# Patient Record
Sex: Female | Born: 1935
Health system: Southern US, Community
[De-identification: ages and names within clinical notes are randomized; demographics above are authoritative.]

## PROBLEM LIST (undated history)

## (undated) DIAGNOSIS — G459 Transient cerebral ischemic attack, unspecified: Secondary | ICD-10-CM

## (undated) DIAGNOSIS — C801 Malignant (primary) neoplasm, unspecified: Secondary | ICD-10-CM

## (undated) DIAGNOSIS — I10 Essential (primary) hypertension: Secondary | ICD-10-CM

## (undated) DIAGNOSIS — N8110 Cystocele, unspecified: Secondary | ICD-10-CM

## (undated) DIAGNOSIS — F0390 Unspecified dementia without behavioral disturbance: Secondary | ICD-10-CM

## (undated) HISTORY — PX: OTHER SURGICAL HISTORY: SHX169

## (undated) HISTORY — PX: ABDOMINAL HYSTERECTOMY: SHX81

---
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
## 2001-08-23 ENCOUNTER — Encounter: Payer: Self-pay | Admitting: Internal Medicine

## 2001-08-23 ENCOUNTER — Encounter: Admission: RE | Admit: 2001-08-23 | Discharge: 2001-08-23 | Payer: Self-pay | Admitting: Internal Medicine

## 2001-09-06 ENCOUNTER — Ambulatory Visit: Admission: RE | Admit: 2001-09-06 | Discharge: 2001-12-05 | Payer: Self-pay | Admitting: Radiation Oncology

## 2012-01-10 DIAGNOSIS — J343 Hypertrophy of nasal turbinates: Secondary | ICD-10-CM | POA: Diagnosis not present

## 2012-01-10 DIAGNOSIS — J309 Allergic rhinitis, unspecified: Secondary | ICD-10-CM | POA: Diagnosis not present

## 2012-01-10 DIAGNOSIS — H60509 Unspecified acute noninfective otitis externa, unspecified ear: Secondary | ICD-10-CM | POA: Diagnosis not present

## 2012-04-10 DIAGNOSIS — I10 Essential (primary) hypertension: Secondary | ICD-10-CM | POA: Diagnosis not present

## 2012-04-10 DIAGNOSIS — Z853 Personal history of malignant neoplasm of breast: Secondary | ICD-10-CM | POA: Diagnosis not present

## 2012-04-10 DIAGNOSIS — E559 Vitamin D deficiency, unspecified: Secondary | ICD-10-CM | POA: Diagnosis not present

## 2012-04-10 DIAGNOSIS — E785 Hyperlipidemia, unspecified: Secondary | ICD-10-CM | POA: Diagnosis not present

## 2012-04-10 DIAGNOSIS — Z79899 Other long term (current) drug therapy: Secondary | ICD-10-CM | POA: Diagnosis not present

## 2012-05-01 DIAGNOSIS — H9209 Otalgia, unspecified ear: Secondary | ICD-10-CM | POA: Diagnosis not present

## 2012-05-01 DIAGNOSIS — S199XXA Unspecified injury of neck, initial encounter: Secondary | ICD-10-CM | POA: Diagnosis not present

## 2012-05-01 DIAGNOSIS — H60509 Unspecified acute noninfective otitis externa, unspecified ear: Secondary | ICD-10-CM | POA: Diagnosis not present

## 2012-05-01 DIAGNOSIS — J309 Allergic rhinitis, unspecified: Secondary | ICD-10-CM | POA: Diagnosis not present

## 2012-05-01 DIAGNOSIS — J343 Hypertrophy of nasal turbinates: Secondary | ICD-10-CM | POA: Diagnosis not present

## 2012-05-01 DIAGNOSIS — S0993XA Unspecified injury of face, initial encounter: Secondary | ICD-10-CM | POA: Diagnosis not present

## 2012-05-28 DIAGNOSIS — H25099 Other age-related incipient cataract, unspecified eye: Secondary | ICD-10-CM | POA: Diagnosis not present

## 2012-07-19 DIAGNOSIS — C50919 Malignant neoplasm of unspecified site of unspecified female breast: Secondary | ICD-10-CM | POA: Diagnosis not present

## 2012-07-31 DIAGNOSIS — D485 Neoplasm of uncertain behavior of skin: Secondary | ICD-10-CM | POA: Diagnosis not present

## 2012-07-31 DIAGNOSIS — D235 Other benign neoplasm of skin of trunk: Secondary | ICD-10-CM | POA: Diagnosis not present

## 2012-07-31 DIAGNOSIS — D2330 Other benign neoplasm of skin of unspecified part of face: Secondary | ICD-10-CM | POA: Diagnosis not present

## 2012-07-31 DIAGNOSIS — L820 Inflamed seborrheic keratosis: Secondary | ICD-10-CM | POA: Diagnosis not present

## 2012-08-10 DIAGNOSIS — L820 Inflamed seborrheic keratosis: Secondary | ICD-10-CM | POA: Diagnosis not present

## 2012-09-04 DIAGNOSIS — D235 Other benign neoplasm of skin of trunk: Secondary | ICD-10-CM | POA: Diagnosis not present

## 2012-09-04 DIAGNOSIS — D2330 Other benign neoplasm of skin of unspecified part of face: Secondary | ICD-10-CM | POA: Diagnosis not present

## 2012-09-04 DIAGNOSIS — L820 Inflamed seborrheic keratosis: Secondary | ICD-10-CM | POA: Diagnosis not present

## 2012-09-04 DIAGNOSIS — D485 Neoplasm of uncertain behavior of skin: Secondary | ICD-10-CM | POA: Diagnosis not present

## 2012-09-18 DIAGNOSIS — D2330 Other benign neoplasm of skin of unspecified part of face: Secondary | ICD-10-CM | POA: Diagnosis not present

## 2012-09-18 DIAGNOSIS — D235 Other benign neoplasm of skin of trunk: Secondary | ICD-10-CM | POA: Diagnosis not present

## 2012-12-05 DIAGNOSIS — G459 Transient cerebral ischemic attack, unspecified: Secondary | ICD-10-CM

## 2012-12-05 HISTORY — DX: Transient cerebral ischemic attack, unspecified: G45.9

## 2013-02-18 DIAGNOSIS — I10 Essential (primary) hypertension: Secondary | ICD-10-CM | POA: Diagnosis not present

## 2013-02-18 DIAGNOSIS — F17200 Nicotine dependence, unspecified, uncomplicated: Secondary | ICD-10-CM | POA: Diagnosis not present

## 2013-02-18 DIAGNOSIS — N8111 Cystocele, midline: Secondary | ICD-10-CM | POA: Diagnosis not present

## 2013-05-02 DIAGNOSIS — J019 Acute sinusitis, unspecified: Secondary | ICD-10-CM | POA: Diagnosis not present

## 2013-06-08 ENCOUNTER — Emergency Department (HOSPITAL_COMMUNITY): Payer: Medicare Other

## 2013-06-08 ENCOUNTER — Encounter (HOSPITAL_COMMUNITY): Payer: Self-pay

## 2013-06-08 ENCOUNTER — Observation Stay (HOSPITAL_COMMUNITY)
Admission: EM | Admit: 2013-06-08 | Discharge: 2013-06-10 | Disposition: A | Discharge status: Home / Self Care | Payer: Medicare Other | Attending: Internal Medicine | Admitting: Internal Medicine

## 2013-06-08 DIAGNOSIS — Z7982 Long term (current) use of aspirin: Secondary | ICD-10-CM | POA: Diagnosis not present

## 2013-06-08 DIAGNOSIS — I10 Essential (primary) hypertension: Secondary | ICD-10-CM | POA: Insufficient documentation

## 2013-06-08 DIAGNOSIS — R270 Ataxia, unspecified: Secondary | ICD-10-CM | POA: Diagnosis present

## 2013-06-08 DIAGNOSIS — Z79899 Other long term (current) drug therapy: Secondary | ICD-10-CM | POA: Insufficient documentation

## 2013-06-08 DIAGNOSIS — H02409 Unspecified ptosis of unspecified eyelid: Secondary | ICD-10-CM | POA: Insufficient documentation

## 2013-06-08 DIAGNOSIS — J329 Chronic sinusitis, unspecified: Secondary | ICD-10-CM | POA: Diagnosis not present

## 2013-06-08 DIAGNOSIS — R279 Unspecified lack of coordination: Secondary | ICD-10-CM

## 2013-06-08 DIAGNOSIS — H532 Diplopia: Secondary | ICD-10-CM | POA: Diagnosis not present

## 2013-06-08 DIAGNOSIS — R42 Dizziness and giddiness: Secondary | ICD-10-CM | POA: Insufficient documentation

## 2013-06-08 DIAGNOSIS — Z720 Tobacco use: Secondary | ICD-10-CM | POA: Diagnosis present

## 2013-06-08 DIAGNOSIS — Z853 Personal history of malignant neoplasm of breast: Secondary | ICD-10-CM | POA: Insufficient documentation

## 2013-06-08 DIAGNOSIS — G459 Transient cerebral ischemic attack, unspecified: Secondary | ICD-10-CM | POA: Insufficient documentation

## 2013-06-08 DIAGNOSIS — F17200 Nicotine dependence, unspecified, uncomplicated: Secondary | ICD-10-CM | POA: Diagnosis not present

## 2013-06-08 DIAGNOSIS — H538 Other visual disturbances: Secondary | ICD-10-CM | POA: Diagnosis not present

## 2013-06-08 DIAGNOSIS — J323 Chronic sphenoidal sinusitis: Secondary | ICD-10-CM | POA: Diagnosis not present

## 2013-06-08 HISTORY — DX: Cystocele, unspecified: N81.10

## 2013-06-08 HISTORY — DX: Essential (primary) hypertension: I10

## 2013-06-08 HISTORY — DX: Malignant (primary) neoplasm, unspecified: C80.1

## 2013-06-08 LAB — RAPID URINE DRUG SCREEN, HOSP PERFORMED
Barbiturates: NOT DETECTED
Benzodiazepines: NOT DETECTED
Cocaine: NOT DETECTED
Opiates: NOT DETECTED
Tetrahydrocannabinol: NOT DETECTED

## 2013-06-08 LAB — GLUCOSE, CAPILLARY: Glucose-Capillary: 97 mg/dL (ref 70–99)

## 2013-06-08 LAB — COMPREHENSIVE METABOLIC PANEL
ALT: 12 U/L (ref 0–35)
AST: 14 U/L (ref 0–37)
Calcium: 8.8 mg/dL (ref 8.4–10.5)
Creatinine, Ser: 0.54 mg/dL (ref 0.50–1.10)
GFR calc Af Amer: >90 mL/min (ref >90)
Sodium: 136 mEq/L (ref 135–145)
Total Protein: 6.5 g/dL (ref 6.0–8.3)

## 2013-06-08 LAB — URINALYSIS, ROUTINE W REFLEX MICROSCOPIC
Bilirubin Urine: NEGATIVE
Ketones, ur: NEGATIVE mg/dL
Nitrite: NEGATIVE
pH: 7 (ref 5.0–8.0)

## 2013-06-08 LAB — CBC
HCT: 37 % (ref 36.0–46.0)
MCH: 31.9 pg (ref 26.0–34.0)
MCH: 31.4 pg (ref 26.0–34.0)
MCHC: 34.8 g/dL (ref 30.0–36.0)
MCV: 91.9 fL (ref 78.0–100.0)
MCV: 91.4 fL (ref 78.0–100.0)
Platelets: 352 10*3/uL (ref 150–400)
RBC: 4.05 MIL/uL (ref 3.87–5.11)
RDW: 14.4 % (ref 11.5–15.5)
WBC: 9.3 10*3/uL (ref 4.0–10.5)

## 2013-06-08 LAB — POCT I-STAT, CHEM 8
BUN: 13 mg/dL (ref 6–23)
Calcium, Ion: 1.1 mmol/L — ABNORMAL LOW (ref 1.13–1.30)
Creatinine, Ser: 0.6 mg/dL (ref 0.50–1.10)
Hemoglobin: 12.6 g/dL (ref 12.0–15.0)
TCO2: 23 mmol/L (ref 0–100)

## 2013-06-08 LAB — ETHANOL: Alcohol, Ethyl (B): <11 mg/dL (ref 0–11)

## 2013-06-08 LAB — DIFFERENTIAL
Basophils Absolute: 0 10*3/uL (ref 0.0–0.1)
Eosinophils Absolute: 0.2 10*3/uL (ref 0.0–0.7)
Eosinophils Relative: 2 % (ref 0–5)

## 2013-06-08 LAB — CREATININE, SERUM: GFR calc Af Amer: >90 mL/min (ref >90)

## 2013-06-08 LAB — POCT I-STAT TROPONIN I: Troponin i, poc: 0 ng/mL (ref 0.00–0.08)

## 2013-06-08 LAB — URINE MICROSCOPIC-ADD ON

## 2013-06-08 LAB — PROTIME-INR: Prothrombin Time: 11.9 seconds (ref 11.6–15.2)

## 2013-06-08 MED ORDER — ACETAMINOPHEN 325 MG PO TABS: 650.0000 mg | ORAL_TABLET | Freq: Four times a day (QID) | ORAL | Status: DC | PRN

## 2013-06-08 MED ORDER — AMOXICILLIN 500 MG PO CAPS
500.0000 mg | ORAL_CAPSULE | Freq: Three times a day (TID) | ORAL | Status: DC
  Filled 2013-06-08 (×2): qty 1

## 2013-06-08 MED ORDER — ASPIRIN 325 MG PO TABS: 325.0000 mg | ORAL_TABLET | Freq: Every day | ORAL | Status: DC

## 2013-06-08 MED ORDER — OLMESARTAN MEDOXOMIL 40 MG PO TABS
40.0000 mg | ORAL_TABLET | Freq: Every day | ORAL | Status: DC
  Administered 2013-06-08 – 2013-06-10 (×3): 40 mg via ORAL
  Filled 2013-06-08 (×3): qty 1

## 2013-06-08 MED ORDER — IRBESARTAN 300 MG PO TABS
300.0000 mg | ORAL_TABLET | Freq: Every day | ORAL | Status: DC
  Filled 2013-06-08: qty 1

## 2013-06-08 MED ORDER — HEPARIN SODIUM (PORCINE) 5000 UNIT/ML IJ SOLN
5000.0000 [IU] | Freq: Three times a day (TID) | INTRAMUSCULAR | Status: DC
  Filled 2013-06-08 (×9): qty 1

## 2013-06-08 MED ORDER — ASPIRIN 325 MG PO TABS
325.0000 mg | ORAL_TABLET | Freq: Every day | ORAL | Status: DC
  Administered 2013-06-08 – 2013-06-10 (×3): 325 mg via ORAL
  Filled 2013-06-08 (×3): qty 1

## 2013-06-08 MED ORDER — SODIUM CHLORIDE 0.9 % IV SOLN
INTRAVENOUS | Status: DC
  Administered 2013-06-08: 18:00:00 via INTRAVENOUS

## 2013-06-08 MED ORDER — AMOXICILLIN 500 MG PO CAPS
500.0000 mg | ORAL_CAPSULE | Freq: Three times a day (TID) | ORAL | Status: DC
  Administered 2013-06-08 – 2013-06-10 (×6): 500 mg via ORAL
  Filled 2013-06-08 (×10): qty 1

## 2013-06-08 MED ORDER — METOCLOPRAMIDE HCL 5 MG/ML IJ SOLN
10.0000 mg | Freq: Once | INTRAMUSCULAR | Status: AC
  Administered 2013-06-08: 10 mg via INTRAVENOUS
  Filled 2013-06-08: qty 2

## 2013-06-08 MED ORDER — FLUTICASONE PROPIONATE 50 MCG/ACT NA SUSP
1.0000 | Freq: Every day | NASAL | Status: DC
  Filled 2013-06-08: qty 16

## 2013-06-08 NOTE — H&P (Signed)
PCP:   Pearla Dubonnet, MD   Chief Complaint:  Dizziness, visual obscuration, unsteadiness.   HPI: This is an 79 year old female, with known history of tobacco abuse, right breast cancer 2002, s/p Lumpectomy/XRT, HTN, bladder prolapse, s/p TAH 2007, According to history obtained from patient's son and daughter who were at the bedside, and supplemented by patient herself, patient went to a cook-out at her daughter' house on 06/07/13, and spent all day there. She was in her usual state of health, until she returned home at about 8:45 PM. On getting out of the car, she complained of dizziness, but was able to walk into the house, and sit on a chair. Later went to bed at about 9:00 PM, and had to get up about 3-4 times during the night, to use the bathroom. Each time, she experienced unsteadiness, but no vertigo or tinnitus. No slurring of speech, difficulty in swallowing, or limb weakness. Otherwise, she slept well. On waking in the morning at about 8:00 AM, she noted double vision on right lateral gaze, without blurring. Son spent the night in patient's home, she awoke him about 10:00AM, and he brought her to the ED, were Head CT scan and brain MRI were negative for acute findings, although mild chronic right sphenoid sinusitis was noted on CT. Patient has had chronic sinus disease and nasal congestion and post-nasal drip for several months. About 3 weeks ago, daughter took her to see ENT, she was prescribed Augumentin 875 mg BID, but refused to take it, because the pills were too big. She has remained symptomatic.     Allergies:  No Known Allergies    Past Medical History  Diagnosis Date  . Cancer     breast 2002  . Hypertension   . Bladder prolapse, female, acquired     Past Surgical History  Procedure Laterality Date  . Abdominal hysterectomy      2007    Prior to Admission medications   Medication Sig Start Date End Date Taking? Authorizing Provider  aspirin 325 MG tablet Take 325  mg by mouth daily.   Yes Historical Provider, MD  olmesartan (BENICAR) 40 MG tablet Take 40 mg by mouth daily.   Yes Historical Provider, MD    Social History: Patient reports that she has been smoking.  She has never used smokeless tobacco. She reports that she does not use illicit drugs. Her alcohol history is not on file.  Family History: Father died at age 8o years. He had atherosclerosis. Mother died at age 40 years. She was s/p CVA at age 48 years, and had CHF.   Review of Systems:  As per HPI and chief complaint. Patent denies fatigue, diminished appetite, weight loss, fever, chills, headache, difficulty in speaking, dysphagia, chest pain, cough, shortness of breath, orthopnea, paroxysmal nocturnal dyspnea, nausea, diaphoresis, abdominal pain, vomiting, diarrhea, belching, heartburn, hematemesis, melena, dysuria, nocturia, urinary frequency, hematochezia, lower extremity swelling, pain, or redness. The rest of the systems review is negative.  Physical Exam:  General:  Patient does not appear to be in obvious acute distress. Alert, communicative, fully oriented, talking in complete sentences, not short of breath at rest.  HEENT:  No clinical pallor, no jaundice, no conjunctival injection or discharge. Hydration is satisfactory.  NECK:  Supple, JVP not seen, no carotid bruits, no palpable lymphadenopathy, no palpable goiter. CHEST:  Clinically clear to auscultation, no wheezes, no crackles. HEART:  Sounds 1 and 2 heard, normal, regular, no murmurs. ABDOMEN:  Full, soft, non-tender,  no palpable organomegaly, no palpable masses, normal bowel sounds. GENITALIA:  Not examined. LOWER EXTREMITIES:  No pitting edema, palpable peripheral pulses. MUSCULOSKELETAL SYSTEM:  Generalized osteoarthritic changes, otherwise, normal. CENTRAL NERVOUS SYSTEM:  No facial asymmetry. PERLA. No visual field abnormalities on confrontation testing. She has mild, non-sustained rotatory nystagmus on left lateral  gaze, and diplopia on right lateral gaze. No past-pointing or finger-to-nose ataxia. Heel-shin test was somewhat equivocal, bilaterally. Power was at least 4=/5 both upper and lower extremities.   Labs on Admission:  Results for orders placed during the hospital encounter of 06/08/13 (from the past 48 hour(s))  GLUCOSE, CAPILLARY     Status: None   Collection Time    06/08/13 11:11 AM      Result Value Range   Glucose-Capillary 97  70 - 99 mg/dL   Comment 1 Documented in Chart     Comment 2 Notify RN    ETHANOL     Status: None   Collection Time    06/08/13 11:52 AM      Result Value Range   Alcohol, Ethyl (B) <11  0 - 11 mg/dL   Comment:            LOWEST DETECTABLE LIMIT FOR     SERUM ALCOHOL IS 11 mg/dL     FOR MEDICAL PURPOSES ONLY  PROTIME-INR     Status: None   Collection Time    06/08/13 11:52 AM      Result Value Range   Prothrombin Time 11.9  11.6 - 15.2 seconds   INR 0.89  0.00 - 1.49  APTT     Status: None   Collection Time    06/08/13 11:52 AM      Result Value Range   aPTT 30  24 - 37 seconds  CBC     Status: None   Collection Time    06/08/13 11:52 AM      Result Value Range   WBC 7.6  4.0 - 10.5 K/uL   RBC 4.07  3.87 - 5.11 MIL/uL   Hemoglobin 13.0  12.0 - 15.0 g/dL   HCT 09.8  11.9 - 14.7 %   MCV 91.9  78.0 - 100.0 fL   MCH 31.9  26.0 - 34.0 pg   MCHC 34.8  30.0 - 36.0 g/dL   RDW 82.9  56.2 - 13.0 %   Platelets 352  150 - 400 K/uL  DIFFERENTIAL     Status: None   Collection Time    06/08/13 11:52 AM      Result Value Range   Neutrophils Relative % 60  43 - 77 %   Neutro Abs 4.6  1.7 - 7.7 K/uL   Lymphocytes Relative 30  12 - 46 %   Lymphs Abs 2.3  0.7 - 4.0 K/uL   Monocytes Relative 8  3 - 12 %   Monocytes Absolute 0.6  0.1 - 1.0 K/uL   Eosinophils Relative 2  0 - 5 %   Eosinophils Absolute 0.2  0.0 - 0.7 K/uL   Basophils Relative 1  0 - 1 %   Basophils Absolute 0.0  0.0 - 0.1 K/uL  COMPREHENSIVE METABOLIC PANEL     Status: Abnormal    Collection Time    06/08/13 11:52 AM      Result Value Range   Sodium 136  135 - 145 mEq/L   Potassium 3.8  3.5 - 5.1 mEq/L   Chloride 103  96 - 112 mEq/L  CO2 26  19 - 32 mEq/L   Glucose, Bld 113 (*) 70 - 99 mg/dL   BUN 13  6 - 23 mg/dL   Creatinine, Ser 6.96  0.50 - 1.10 mg/dL   Calcium 8.8  8.4 - 29.5 mg/dL   Total Protein 6.5  6.0 - 8.3 g/dL   Albumin 3.5  3.5 - 5.2 g/dL   AST 14  0 - 37 U/L   ALT 12  0 - 35 U/L   Alkaline Phosphatase 74  39 - 117 U/L   Total Bilirubin 0.7  0.3 - 1.2 mg/dL   GFR calc non Af Amer 87 (*) >90 mL/min   GFR calc Af Amer >90  >90 mL/min   Comment:            The eGFR has been calculated     using the CKD EPI equation.     This calculation has not been     validated in all clinical     situations.     eGFR's persistently     <90 mL/min signify     possible Chronic Kidney Disease.  TROPONIN I     Status: None   Collection Time    06/08/13 11:52 AM      Result Value Range   Troponin I <0.30  <0.30 ng/mL   Comment:            Due to the release kinetics of cTnI,     a negative result within the first hours     of the onset of symptoms does not rule out     myocardial infarction with certainty.     If myocardial infarction is still suspected,     repeat the test at appropriate intervals.  POCT I-STAT TROPONIN I     Status: None   Collection Time    06/08/13 12:14 PM      Result Value Range   Troponin i, poc 0.00  0.00 - 0.08 ng/mL   Comment 3            Comment: Due to the release kinetics of cTnI,     a negative result within the first hours     of the onset of symptoms does not rule out     myocardial infarction with certainty.     If myocardial infarction is still suspected,     repeat the test at appropriate intervals.  POCT I-STAT, CHEM 8     Status: Abnormal   Collection Time    06/08/13 12:16 PM      Result Value Range   Sodium 136  135 - 145 mEq/L   Potassium 3.9  3.5 - 5.1 mEq/L   Chloride 104  96 - 112 mEq/L   BUN 13  6  - 23 mg/dL   Creatinine, Ser 2.84  0.50 - 1.10 mg/dL   Glucose, Bld 132 (*) 70 - 99 mg/dL   Calcium, Ion 4.40 (*) 1.13 - 1.30 mmol/L   TCO2 23  0 - 100 mmol/L   Hemoglobin 12.6  12.0 - 15.0 g/dL   HCT 10.2  72.5 - 36.6 %  URINE RAPID DRUG SCREEN (HOSP PERFORMED)     Status: None   Collection Time    06/08/13 12:41 PM      Result Value Range   Opiates NONE DETECTED  NONE DETECTED   Cocaine NONE DETECTED  NONE DETECTED   Benzodiazepines NONE DETECTED  NONE DETECTED   Amphetamines NONE DETECTED  NONE DETECTED   Tetrahydrocannabinol NONE DETECTED  NONE DETECTED   Barbiturates NONE DETECTED  NONE DETECTED   Comment:            DRUG SCREEN FOR MEDICAL PURPOSES     ONLY.  IF CONFIRMATION IS NEEDED     FOR ANY PURPOSE, NOTIFY LAB     WITHIN 5 DAYS.                LOWEST DETECTABLE LIMITS     FOR URINE DRUG SCREEN     Drug Class       Cutoff (ng/mL)     Amphetamine      1000     Barbiturate      200     Benzodiazepine   200     Tricyclics       300     Opiates          300     Cocaine          300     THC              50  URINALYSIS, ROUTINE W REFLEX MICROSCOPIC     Status: Abnormal   Collection Time    06/08/13 12:41 PM      Result Value Range   Color, Urine YELLOW  YELLOW   APPearance CLOUDY (*) CLEAR   Specific Gravity, Urine 1.014  1.005 - 1.030   pH 7.0  5.0 - 8.0   Glucose, UA NEGATIVE  NEGATIVE mg/dL   Hgb urine dipstick TRACE (*) NEGATIVE   Bilirubin Urine NEGATIVE  NEGATIVE   Ketones, ur NEGATIVE  NEGATIVE mg/dL   Protein, ur NEGATIVE  NEGATIVE mg/dL   Urobilinogen, UA 0.2  0.0 - 1.0 mg/dL   Nitrite NEGATIVE  NEGATIVE   Leukocytes, UA NEGATIVE  NEGATIVE  URINE MICROSCOPIC-ADD ON     Status: None   Collection Time    06/08/13 12:41 PM      Result Value Range   Squamous Epithelial / LPF RARE  RARE   WBC, UA 0-2  <3 WBC/hpf   RBC / HPF 0-2  <3 RBC/hpf    Radiological Exams on Admission: Ct Head Wo Contrast  06/08/2013   *RADIOLOGY REPORT*  Clinical Data:  Ataxia.  Visual disturbances.  Difficulty focusing vision.  CT HEAD WITHOUT CONTRAST  Technique:  Contiguous axial images were obtained from the base of the skull through the vertex without contrast.  Comparison: None.  Findings: The brain stem, cerebellum, cerebral peduncles, thalami, basal ganglia, basilar cisterns, and ventricular system appear unremarkable.  Periventricular and corona radiata white matter hypodensities are most compatible with chronic ischemic microvascular white matter disease.  No intracranial hemorrhage, mass lesion, or acute infarction is identified.  Mild chronic right sphenoid sinusitis.  Middle ears and mastoids unremarkable.  IMPRESSION:  1.  No acute intracranial findings. 2.  Mild chronic right sphenoid sinusitis. 3.  Periventricular and corona radiata white matter hypodensities are most compatible with chronic ischemic microvascular white matter disease.   Original Report Authenticated By: Gaylyn Rong, M.D.   Mr Brain Wo Contrast  06/08/2013   *RADIOLOGY REPORT*  Clinical Data: Vertigo.  Difficulty with ambulation.  MRI HEAD WITHOUT CONTRAST  Technique:  Multiplanar, multiecho pulse sequences of the brain and surrounding structures were obtained according to standard protocol without intravenous contrast.  Comparison: CT head earlier today.  Findings:  There is no evidence for acute infarction, intracranial hemorrhage, mass lesion, hydrocephalus, or extra-axial  fluid. Moderate atrophy.  Extensive small vessel disease.  No foci of chronic hemorrhage.  Prominent perivascular spaces suggest longstanding hypertension.  Flow voids are maintained in the carotid and basilar arteries with both vertebrals contributing. Mild chronic sphenoid sinus disease.  Negative orbits.  No mastoid fluid.  IMPRESSION: Atrophy and chronic microvascular ischemic change.  No acute intracranial findings.   Original Report Authenticated By: Davonna Belling, M.D.    Assessment/Plan Active  Problems:   1. Ataxia/Diplopia: Patient presented with relatively sudden onset of dizziness, gait unsteadiness and diplopia. Brain imaging studies have shown no evidence of acute CVA, but she remains symptomatic, and clinically, may have sustained a TIA. Will place on low dose ASA, and complete TIA workup per protocol, as well as PT/OT and risk factor modification. Have consulted Dr Thana Farr, neurologist. Other differentials, including vestibular neuronitis are under consideration.  2. Sinusitis: Patient has a long history of sinus problems, and has been troubled by nasal congestion, postnasal drip for the past few months, now reportedly worse. Left maxillary sinus diease was confirmed by head CT scan. Will manage with a 7-day course of Amoxicillin, as well as Flonase nasal spray.  3. HTN (hypertension): BP is sub-optimally controlled at 169/96-150/67 at this time. Will continue pre-admission antihypertensives, and monitor.  4. Tobacco abuse: Patient smokes about a pack of cigarettes per day. Counseled appropriately. She has declined Nicoderm CQ patch.   Further management will depend on clinical course.   Comment: Patient is FULL CODE.    Time Spent on Admission: 1 hour.   Chrystal Zeimet,CHRISTOPHER 06/08/2013, 3:51 PM

## 2013-06-08 NOTE — ED Provider Notes (Signed)
   History    CSN: [id number] Arrival date & time 06/08/13  1101  First MD Initiated Contact with Patient 06/08/13 1136     Chief Complaint  Patient presents with  . Cerebrovascular Accident   (Consider location/radiation/quality/duration/timing/severity/associated sxs/prior Treatment) HPI No past medical history on file. No past surgical history on file. No family history on file. History  Substance Use Topics  . Smoking status: Not on file  . Smokeless tobacco: Not on file  . Alcohol Use: Not on file   OB History   No data available     Review of Systems  Allergies  Review of patient's allergies indicates no known allergies.  Home Medications   Current Outpatient Rx  Name  Route  Sig  Dispense  Refill  . aspirin 325 MG tablet   Oral   Take 325 mg by mouth daily.         Marland Kitchen olmesartan (BENICAR) 40 MG tablet   Oral   Take 40 mg by mouth daily.          BP 169/96  Pulse 68  Temp(Src) 98.3 F (36.8 C) (Oral)  Resp 18  SpO2 97% Physical Exam  ED Course  Procedures (including critical care time) Labs Reviewed  GLUCOSE, CAPILLARY   Ct Head Wo Contrast  06/08/2013   *RADIOLOGY REPORT*  Clinical Data: Ataxia.  Visual disturbances.  Difficulty focusing vision.  CT HEAD WITHOUT CONTRAST  Technique:  Contiguous axial images were obtained from the base of the skull through the vertex without contrast.  Comparison: None.  Findings: The brain stem, cerebellum, cerebral peduncles, thalami, basal ganglia, basilar cisterns, and ventricular system appear unremarkable.  Periventricular and corona radiata white matter hypodensities are most compatible with chronic ischemic microvascular white matter disease.  No intracranial hemorrhage, mass lesion, or acute infarction is identified.  Mild chronic right sphenoid sinusitis.  Middle ears and mastoids unremarkable.  IMPRESSION:  1.  No acute intracranial findings. 2.  Mild chronic right sphenoid sinusitis. 3.  Periventricular and  corona radiata white matter hypodensities are most compatible with chronic ischemic microvascular white matter disease.   Original Report Authenticated By: Gaylyn Rong, M.D.   No diagnosis found.  MDM  Not my patient. Not seen by me.  Doug Sou, MD 06/09/13 [id number]

## 2013-06-08 NOTE — ED Notes (Signed)
Patient does not have any pain. Patient has some short rotation of eyes noted. Patient states that her right eye feels "not normal" Has double vision when using right peripheral vision. Has normal single vision when using left peripheral vision

## 2013-06-08 NOTE — ED Provider Notes (Signed)
History    CSN: [id number] Arrival date & time 06/08/13  1101  First MD Initiated Contact with Patient 06/08/13 1136     Chief Complaint  Patient presents with  . Cerebrovascular Accident   (Consider location/radiation/quality/duration/timing/severity/associated sxs/prior Treatment) HPI This 79 year old female was last known well last night at 9:00pm when she had sudden onset of vertigo and gait ataxia without headache or altered mental status without change in speech or swallowing without focal or lateralizing weakness or numbness. She is no syncope trauma lightheadedness chest pain palpitation shortness breath abdominal pain vomiting diarrhea or bloody stools. There is no treatment prior to arrival. When she was still ataxic this morning she was brought to the ED.  Past Medical History  Diagnosis Date  . Cancer     breast 2002  . Hypertension   . Bladder prolapse, female, acquired    Past Surgical History  Procedure Laterality Date  . Abdominal hysterectomy      2007   No family history on file. History  Substance Use Topics  . Smoking status: Current Every Day Smoker  . Smokeless tobacco: Never Used  . Alcohol Use: Not on file     Comment: every night   OB History   Grav Para Term Preterm Abortions TAB SAB Ect Mult Living                 Review of Systems 10 Systems reviewed and are negative for acute change except as noted in the HPI. Allergies  Review of patient's allergies indicates no known allergies.  Home Medications   No current outpatient prescriptions on file. BP 124/58  Pulse 59  Temp(Src) 98 F (36.7 C) (Oral)  Resp 20  Ht 5\' 8"  (1.727 m)  Wt 128 lb (58.06 kg)  BMI 19.47 kg/m2  SpO2 95% Physical Exam  Nursing note and vitals reviewed. Constitutional:  Awake, alert, nontoxic appearance with baseline speech for patient.  HENT:  Head: Atraumatic.  Mouth/Throat: No oropharyngeal exudate.  Eyes: EOM are normal. Pupils are equal, round, and  reactive to light. Right eye exhibits no discharge. Left eye exhibits no discharge.  Patient has multidirectional nystagmus including horizontal, rotary, and vertical  Neck: Neck supple.  Cardiovascular: Normal rate and regular rhythm.   No murmur heard. Pulmonary/Chest: Effort normal and breath sounds normal. No stridor. No respiratory distress. She has no wheezes. She has no rales. She exhibits no tenderness.  Abdominal: Soft. Bowel sounds are normal. She exhibits no mass. There is no tenderness. There is no rebound.  Musculoskeletal: She exhibits no tenderness.  Baseline ROM, moves extremities with no obvious new focal weakness.  Lymphadenopathy:    She has no cervical adenopathy.  Neurological: She is alert.  Awake, alert, cooperative and aware of situation; motor strength 5/5 bilaterally; sensation normal to light touch bilaterally; peripheral visual fields full to confrontation; no facial asymmetry; tongue midline; major cranial nerves appear intact; no pronator drift, normal finger to nose bilaterally, gait with new ataxia.  Skin: No rash noted.  Psychiatric: She has a normal mood and affect.    ED Course  Procedures (including critical care time) ECG: Sinus rhythm, ventricular, normal axis, incomplete right bundle branch block, no acute ischemic changes noted, no comparison ECG available   D/w Neuro [facility] will consult after Pt admitted. D/w [facility] since MR neg will Obs at [facility] per [facility] request.  Labs Reviewed  COMPREHENSIVE METABOLIC PANEL - Abnormal; Notable for the following:    Glucose, Bld  113 (*)    GFR calc non Af Amer 87 (*)    All other components within normal limits  URINALYSIS, ROUTINE W REFLEX MICROSCOPIC - Abnormal; Notable for the following:    APPearance CLOUDY (*)    Hgb urine dipstick TRACE (*)    All other components within normal limits  CREATININE, SERUM - Abnormal; Notable for the following:    Creatinine, Ser 0.42 (*)    All other components  within normal limits  POCT I-STAT, CHEM 8 - Abnormal; Notable for the following:    Glucose, Bld 101 (*)    Calcium, Ion 1.10 (*)    All other components within normal limits  GLUCOSE, CAPILLARY  ETHANOL  PROTIME-INR  APTT  CBC  DIFFERENTIAL  TROPONIN I  URINE RAPID DRUG SCREEN (HOSP PERFORMED)  URINE MICROSCOPIC-ADD ON  CBC  GLUCOSE, CAPILLARY  HEMOGLOBIN A1C  STRIATED MUSCLE ANTIBODY  MYASTHENIA GRAVIS PANEL 2  TSH  LIPID PANEL  CBC  BASIC METABOLIC PANEL  POCT I-STAT TROPONIN I   Ct Head Wo Contrast  06/08/2013   *RADIOLOGY REPORT*  Clinical Data: Ataxia.  Visual disturbances.  Difficulty focusing vision.  CT HEAD WITHOUT CONTRAST  Technique:  Contiguous axial images were obtained from the base of the skull through the vertex without contrast.  Comparison: None.  Findings: The brain stem, cerebellum, cerebral peduncles, thalami, basal ganglia, basilar cisterns, and ventricular system appear unremarkable.  Periventricular and corona radiata white matter hypodensities are most compatible with chronic ischemic microvascular white matter disease.  No intracranial hemorrhage, mass lesion, or acute infarction is identified.  Mild chronic right sphenoid sinusitis.  Middle ears and mastoids unremarkable.  IMPRESSION:  1.  No acute intracranial findings. 2.  Mild chronic right sphenoid sinusitis. 3.  Periventricular and corona radiata white matter hypodensities are most compatible with chronic ischemic microvascular white matter disease.   Original Report Authenticated By: Gaylyn Rong, M.D.   Mr Brain Wo Contrast  06/08/2013   *RADIOLOGY REPORT*  Clinical Data: Vertigo.  Difficulty with ambulation.  MRI HEAD WITHOUT CONTRAST  Technique:  Multiplanar, multiecho pulse sequences of the brain and surrounding structures were obtained according to standard protocol without intravenous contrast.  Comparison: CT head earlier today.  Findings:  There is no evidence for acute infarction,  intracranial hemorrhage, mass lesion, hydrocephalus, or extra-axial fluid. Moderate atrophy.  Extensive small vessel disease.  No foci of chronic hemorrhage.  Prominent perivascular spaces suggest longstanding hypertension.  Flow voids are maintained in the carotid and basilar arteries with both vertebrals contributing. Mild chronic sphenoid sinus disease.  Negative orbits.  No mastoid fluid.  IMPRESSION: Atrophy and chronic microvascular ischemic change.  No acute intracranial findings.   Original Report Authenticated By: Davonna Belling, M.D.   1. Ataxia   2. Diplopia   3. HTN (hypertension)   4. Sinusitis   5. Dizziness     MDM  The patient appears reasonably stabilized for admission considering the current resources, flow, and capabilities available in the ED at this time, and I doubt any other [facility] requiring further screening and/or treatment in the ED prior to admission.  Hurman Horn, MD 06/08/13 [phone]

## 2013-06-08 NOTE — ED Notes (Signed)
Patient transported to MRI 

## 2013-06-08 NOTE — ED Notes (Signed)
She c/o difficulty focusing her vision; and having trouble ambulating, since yesterday evening.  She states she has had significant "sinus congestion" for several days prior to this.  She is in no distress, and is here with her son.

## 2013-06-08 NOTE — Consult Note (Addendum)
Referring Physician: Kevan Ny    Chief Complaint: Dizziness and gait difficulties  HPI: Anna Curtis is an 79 y.o. female who reports that she was fine yesterday while celebrating the 4th with family.  When she returned home she had acute onset of dizziness and difficulty with gait. She decided to go to bed since she was feeling poorly.  When she awakened this morning she was feeling even worse.  Gait was difficult.  She also noted that she had particular problems when she looks to the right.  Describes diplopia when she looks to the right.  Patient also reports that for quite a few months she has noticed that her eyelids hang down too far.   Date last known well: Date: 06/07/2013 Time last known well: Time: 21:00 tPA Given: No: Outside of time window  Past Medical History  Diagnosis Date  . Cancer     breast 2002  . Hypertension   . Bladder prolapse, female, acquired     Past Surgical History  Procedure Laterality Date  . Abdominal hysterectomy      2007    Family history: Parents deceased.  Social History:  reports that she has been smoking.  She has never used smokeless tobacco. She reports that she does not use illicit drugs. Her alcohol history is not on file.  Allergies: No Known Allergies  Medications: I have reviewed the patient's current medications. Prior to Admission:  Current outpatient prescriptions: aspirin 325 MG tablet, Take 325 mg by mouth daily., Disp: , Rfl: ;   olmesartan (BENICAR) 40 MG tablet, Take 40 mg by mouth daily., Disp: , Rfl:   ROS: History obtained from the patient  General ROS: negative for - chills, fatigue, fever, night sweats, weight gain or weight loss Psychological ROS: negative for - behavioral disorder, hallucinations, memory difficulties, mood swings or suicidal ideation Ophthalmic ROS: negative for - blurry vision, double vision, eye pain or loss of vision ENT ROS: sinus symptoms Allergy and Immunology ROS: negative for - hives or  itchy/watery eyes Hematological and Lymphatic ROS: negative for - bleeding problems, bruising or swollen lymph nodes Endocrine ROS: negative for - galactorrhea, hair pattern changes, polydipsia/polyuria or temperature intolerance Respiratory ROS: negative for - cough, hemoptysis, shortness of breath or wheezing Cardiovascular ROS: negative for - chest pain, dyspnea on exertion, edema or irregular heartbeat Gastrointestinal ROS: negative for - abdominal pain, diarrhea, hematemesis, nausea/vomiting or stool incontinence Genito-Urinary ROS: negative for - dysuria, hematuria, incontinence or urinary frequency/urgency Musculoskeletal ROS: negative for - joint swelling or muscular weakness Neurological ROS: as noted in HPI Dermatological ROS: negative for rash and skin lesion changes  Physical Examination: Blood pressure 150/67, pulse 63, temperature 98.1 F (36.7 C), temperature source Oral, resp. rate 18, SpO2 96.00%.  Neurologic Examination: Mental Status: Alert, oriented, thought content appropriate.  Speech fluent without evidence of aphasia.  Able to follow 3 step commands without difficulty. Cranial Nerves: II: Discs flat bilaterally; Visual fields grossly normal, pupils equal, round, reactive to light and accommodation III,IV, VI: ptosis bilaterally, extra-ocular motions intact to the left.  On upward gaze right eye moves inward.  On eye movement to the right, the right eye does not bury fully and the patient complains of diplopia.  Rotary nystagmus is noted in all positions V,VII: smile symmetric, facial light touch sensation normal bilaterally VIII: hearing normal bilaterally IX,X: gag reflex present XI: bilateral shoulder shrug XII: midline tongue extension Motor: Right : Upper extremity   5/5    Left:  Upper extremity   5/5  Lower extremity   5/5     Lower extremity   5/5 Tone and bulk:normal tone throughout; no atrophy noted Sensory: Pinprick and light touch intact throughout,  bilaterally Deep Tendon Reflexes: 2+ in the upper extremities, trace at the knees and absent in the lower extremities.   Plantars: Right: upgoing   Left: upgoing Cerebellar: normal finger-to-nose and normal heel-to-shin test Gait: Unable to test CV: pulses palpable throughout   Laboratory Studies:  Basic Metabolic Panel:  Recent Labs Lab 06/08/13 1152 06/08/13 1216  NA 136 136  K 3.8 3.9  CL 103 104  CO2 26  --   GLUCOSE 113* 101*  BUN 13 13  CREATININE 0.54 0.60  CALCIUM 8.8  --     Liver Function Tests:  Recent Labs Lab 06/08/13 1152  AST 14  ALT 12  ALKPHOS 74  BILITOT 0.7  PROT 6.5  ALBUMIN 3.5   No results found for this basename: LIPASE, AMYLASE,  in the last 168 hours No results found for this basename: AMMONIA,  in the last 168 hours  CBC:  Recent Labs Lab 06/08/13 1152 06/08/13 1216  WBC 7.6  --   NEUTROABS 4.6  --   HGB 13.0 12.6  HCT 37.4 37.0  MCV 91.9  --   PLT 352  --     Cardiac Enzymes:  Recent Labs Lab 06/08/13 1152  TROPONINI <0.30    BNP: No components found with this basename: POCBNP,   CBG:  Recent Labs Lab 06/08/13 1111  GLUCAP 97    Microbiology: No results found for this or any previous visit.  Coagulation Studies:  Recent Labs  06/08/13 1152  LABPROT 11.9  INR 0.89    Urinalysis:  Recent Labs Lab 06/08/13 1241  COLORURINE YELLOW  LABSPEC 1.014  PHURINE 7.0  GLUCOSEU NEGATIVE  HGBUR TRACE*  BILIRUBINUR NEGATIVE  KETONESUR NEGATIVE  PROTEINUR NEGATIVE  UROBILINOGEN 0.2  NITRITE NEGATIVE  LEUKOCYTESUR NEGATIVE    Lipid Panel: No results found for this basename: chol, trig, hdl, cholhdl, vldl, ldlcalc    HgbA1C:  No results found for this basename: HGBA1C    Urine Drug Screen:     Component Value Date/Time   LABOPIA NONE DETECTED 06/08/2013 1241   COCAINSCRNUR NONE DETECTED 06/08/2013 1241   LABBENZ NONE DETECTED 06/08/2013 1241   AMPHETMU NONE DETECTED 06/08/2013 1241   THCU NONE  DETECTED 06/08/2013 1241   LABBARB NONE DETECTED 06/08/2013 1241    Alcohol Level:  Recent Labs Lab 06/08/13 1152  ETH <11    Other results: EKG: sinus rhythm at 64 bpm.  Imaging: Ct Head Wo Contrast  06/08/2013   *RADIOLOGY REPORT*  Clinical Data: Ataxia.  Visual disturbances.  Difficulty focusing vision.  CT HEAD WITHOUT CONTRAST  Technique:  Contiguous axial images were obtained from the base of the skull through the vertex without contrast.  Comparison: None.  Findings: The brain stem, cerebellum, cerebral peduncles, thalami, basal ganglia, basilar cisterns, and ventricular system appear unremarkable.  Periventricular and corona radiata white matter hypodensities are most compatible with chronic ischemic microvascular white matter disease.  No intracranial hemorrhage, mass lesion, or acute infarction is identified.  Mild chronic right sphenoid sinusitis.  Middle ears and mastoids unremarkable.  IMPRESSION:  1.  No acute intracranial findings. 2.  Mild chronic right sphenoid sinusitis. 3.  Periventricular and corona radiata white matter hypodensities are most compatible with chronic ischemic microvascular white matter disease.   Original Report Authenticated By:  Gaylyn Rong, M.D.   Mr Brain Wo Contrast  06/08/2013   *RADIOLOGY REPORT*  Clinical Data: Vertigo.  Difficulty with ambulation.  MRI HEAD WITHOUT CONTRAST  Technique:  Multiplanar, multiecho pulse sequences of the brain and surrounding structures were obtained according to standard protocol without intravenous contrast.  Comparison: CT head earlier today.  Findings:  There is no evidence for acute infarction, intracranial hemorrhage, mass lesion, hydrocephalus, or extra-axial fluid. Moderate atrophy.  Extensive small vessel disease.  No foci of chronic hemorrhage.  Prominent perivascular spaces suggest longstanding hypertension.  Flow voids are maintained in the carotid and basilar arteries with both vertebrals contributing. Mild chronic  sphenoid sinus disease.  Negative orbits.  No mastoid fluid.  IMPRESSION: Atrophy and chronic microvascular ischemic change.  No acute intracranial findings.   Original Report Authenticated By: Davonna Belling, M.D.    Assessment: 79 y.o. female presenting with dizziness and difficulty with gait.  On examination with nystagmus and extraocular abnormalities along with ptosis.  MRI of the brain has been reviewed and shows no acute abnormalities.  Extensive small vessel white matter changes are noted.  Although a MR negative infarct remains on the differential, can not rule out other possibilities such as MG.    Stroke Risk Factors - hypertension, smoking  Plan: 1. HgbA1c, fasting lipid panel 2. PT consult, OT consult 3. Echocardiogram 4. Carotid dopplers 5. Prophylactic therapy-Antiplatelet med: Continue Aspirin - dose 325mg  daily 6. Myasthenia panel, TSH 7. Telemetry monitoring 8. Frequent neuro checks 9.  If above work up unremarkable would consider repeat of MR in 2-3 days   Thana Farr, MD [facility] [phone] 06/08/2013, 4:37 PM

## 2013-06-08 NOTE — ED Notes (Signed)
Patient' son stated that he last saw his mother normal 7/4 at 9 pm. Stated that she had some visual changes, disoriented, dry mouth, blurred vsion in right eye dizzy. Has had some trouble walking and confusion. Symptoms persisted this morning along with some nausea.

## 2013-06-08 NOTE — ED Notes (Signed)
ZOX:[id number]<AV> Expected date:06/08/13<BR> Expected time:11:19 AM<BR> Means of arrival:Ambulance<BR> Comments:<BR> Urgent care pt, 118/76 76 16 97.4 17 day cruise to [place], URI, treated with antibiotics and steroids, not better, feels like something is sitting on chest

## 2013-06-09 DIAGNOSIS — R279 Unspecified lack of coordination: Secondary | ICD-10-CM | POA: Diagnosis not present

## 2013-06-09 DIAGNOSIS — I10 Essential (primary) hypertension: Secondary | ICD-10-CM | POA: Diagnosis not present

## 2013-06-09 DIAGNOSIS — H532 Diplopia: Secondary | ICD-10-CM | POA: Diagnosis not present

## 2013-06-09 LAB — GLUCOSE, CAPILLARY
Glucose-Capillary: 101 mg/dL — ABNORMAL HIGH (ref 70–99)
Glucose-Capillary: 109 mg/dL — ABNORMAL HIGH (ref 70–99)

## 2013-06-09 LAB — CBC
HCT: 36.7 % (ref 36.0–46.0)
Hemoglobin: 12.5 g/dL (ref 12.0–15.0)
RBC: 3.98 MIL/uL (ref 3.87–5.11)
WBC: 7.3 10*3/uL (ref 4.0–10.5)

## 2013-06-09 LAB — BASIC METABOLIC PANEL
BUN: 12 mg/dL (ref 6–23)
CO2: 24 mEq/L (ref 19–32)
Chloride: 106 mEq/L (ref 96–112)
GFR calc Af Amer: >90 mL/min (ref >90)
Glucose, Bld: 90 mg/dL (ref 70–99)
Potassium: 3.6 mEq/L (ref 3.5–5.1)

## 2013-06-09 LAB — LIPID PANEL: LDL Cholesterol: 125 mg/dL — ABNORMAL HIGH (ref 0–99)

## 2013-06-09 NOTE — Progress Notes (Signed)
  Echocardiogram 2D Echocardiogram has been performed.  Cathie Beams 06/09/2013, 12:28 PM

## 2013-06-09 NOTE — Evaluation (Signed)
Physical Therapy Evaluation Patient Details Name: Anna Curtis MRN: [id number] DOB: 1931-11-19 Today's Date: 06/09/2013 Time: 8469-6295 PT Time Calculation (min): 18 min  PT Assessment / Plan / Recommendation History of Present Illness  Anna Curtis is an 79 y.o. female who reports that she was fine yesterday while celebrating the 4th with family. When she returned home she had acute onset of dizziness and difficulty with gait. She decided to go to bed since she was feeling poorly. When she awakened this morning she was feeling even worse. Gait was difficult. She also noted that she had particular problems when she looks to the right. Describes diplopia when she looks to the right.    Clinical Impression  Pt with high level balance issues; will benefit from continued PT this venue to improve safety and balance, see deficits below; May benefit from OPPT s HHPT at D/C depending on progress    PT Assessment  Patient needs continued PT services    Follow Up Recommendations  Home health PT;Outpatient PT    Does the patient have the potential to tolerate intense rehabilitation      Barriers to Discharge        Equipment Recommendations  None recommended by PT    Recommendations for Other Services     Frequency      Precautions / Restrictions Precautions Precautions: Fall   Pertinent Vitals/Pain No c/o pain      Mobility  Bed Mobility Bed Mobility: Supine to Sit;Sitting - Scoot to Edge of Bed;Sit to Supine Supine to Sit: 5: Supervision Sitting - Scoot to Edge of Bed: 5: Supervision Sit to Supine: 5: Supervision Transfers Transfers: Sit to Stand;Stand to Sit Sit to Stand: 4: Min guard;5: Supervision;From bed Stand to Sit: 5: Supervision;To bed;To toilet Ambulation/Gait Ambulation/Gait Assistance: 5: Supervision;4: Min guard Ambulation Distance (Feet): 300 Feet Assistive device: None Ambulation/Gait Assistance Details: pt wanted to push IV pole for incr stability; pt  with decr UE swing plus deficits below; LOB x 1 with challenges Gait Pattern: Step-through pattern;Ataxic General Gait Details: miildly ataxic    Exercises     PT Diagnosis:    PT Problem List: Decreased balance;Decreased mobility;Decreased safety awareness PT Treatment Interventions:       PT Goals(Current goals can be found in the care plan section) Acute Rehab PT Goals Patient Stated Goal: To get better and go home PT Goal Formulation: With patient Time For Goal Achievement: 06/16/13 Potential to Achieve Goals: Good  Visit Information  Last PT Received On: 06/09/13 Assistance Needed: +1 History of Present Illness: Anna Curtis is an 79 y.o. female who reports that she was fine yesterday while celebrating the 4th with family. When she returned home she had acute onset of dizziness and difficulty with gait. She decided to go to bed since she was feeling poorly. When she awakened this morning she was feeling even worse. Gait was difficult. She also noted that she had particular problems when she looks to the right. Describes diplopia when she looks to the right.         Prior Functioning  Home Living Family/patient expects to be discharged to:: Private residence Living Arrangements: Alone Available Help at Discharge: Family;Available PRN/intermittently Type of Home: House Home Access: Level entry Home Layout: Two level;Full bath on main level;Able to live on main level with bedroom/bathroom Home Equipment: Grab bars - toilet;Grab bars - tub/shower Prior Function Level of Independence: Independent Communication Communication: No difficulties Dominant Hand: Right    Cognition  Cognition Arousal/Alertness: Awake/alert Behavior During Therapy: Restless Overall Cognitive Status: Within Functional Limits for tasks assessed    Extremity/Trunk Assessment Upper Extremity Assessment Upper Extremity Assessment: Defer to OT evaluation Lower Extremity Assessment Lower  Extremity Assessment: Overall WFL for tasks assessed Cervical / Trunk Assessment Cervical / Trunk Assessment: Normal   Balance Dynamic Standing Balance Dynamic Standing - Balance Support: No upper extremity supported Dynamic Standing - Level of Assistance: 5: Stand by assistance Dynamic Standing - Balance Activities: Lateral lean/weight shifting Dynamic Standing - Comments: unable to maintain unilateral stance with 2 attempts; pt fell onto bed on first attempt; second min assist/UE support to maintain;  High Level Balance High Level Balance Activites: Side stepping;Backward walking;Direction changes;Turns;Sudden stops;Head turns High Level Balance Comments: head up/down only due to dizziness/diplopia with head turns to right  End of Session PT - End of Session Equipment Utilized During Treatment: Gait belt Activity Tolerance: Patient tolerated treatment well Patient left: in bed;with call bell/phone within reach;with family/visitor present Nurse Communication: Mobility status  GP     [facility] 06/09/2013, 5:16 PM

## 2013-06-09 NOTE — Evaluation (Signed)
Occupational Therapy Evaluation Patient Details Name: Anna Curtis MRN: [id number] DOB: 08/15/1931 Today's Date: 06/09/2013 Time: 4098-1191 OT Time Calculation (min): 51 min  OT Assessment / Plan / Recommendation History of present illness   Anna Curtis is an 79 y.o. female who reports that she was fine yesterday while celebrating the 4th with family. When she returned home she had acute onset of dizziness and difficulty with gait. She decided to go to bed since she was feeling poorly. When she awakened this morning she was feeling even worse. Gait was difficult. She also noted that she had particular problems when she looks to the right. Describes diplopia when she looks to the right.  Patient also reports that for quite a few months she has noticed that her eyelids hang down too far.  MRI negative for acute infarct; however, midbrain infarct suspected.  Pt undergoing stroke work up and  Work up to rule out Myasthenia gravis    Clinical Impression   Pt presents to OT with very mild nystagmus Rt lateral gaze and decreased EOM with Rt. Lateral gaze resulting in diplopia.  Pt. Also with mild balance deficits.  Pt requires supervision for BADLs.  Have provided her with HEP for vision.  She will benefit from continued OT to maximize safety and independence with BADLs.  Plan is to return home with support of family.  Currently, am recommending OPOT; however, pt may progress to the point that this is not necessary.  Will continue to follow acutely    OT Assessment  Patient needs continued OT Services    Follow Up Recommendations  Outpatient OT;Supervision - Intermittent    Barriers to Discharge      Equipment Recommendations  Tub/shower seat    Recommendations for Other Services    Frequency  Min 2X/week    Precautions / Restrictions Precautions Precautions: Fall       ADL  Eating/Feeding: Independent Where Assessed - Eating/Feeding: Chair;Bed level Grooming: Wash/dry  hands;Brushing hair;Supervision/safety Where Assessed - Grooming: Unsupported standing Upper Body Bathing: Set up Where Assessed - Upper Body Bathing: Unsupported sitting Lower Body Bathing: Min guard Where Assessed - Lower Body Bathing: Unsupported sit to stand Upper Body Dressing: Set up Where Assessed - Upper Body Dressing: Unsupported sitting Lower Body Dressing: Supervision/safety Where Assessed - Lower Body Dressing: Unsupported sit to stand Toilet Transfer: Supervision/safety Toilet Transfer Method: Sit to stand;Stand pivot Acupuncturist: Comfort height toilet Toileting - Clothing Manipulation and Hygiene: Supervision/safety Where Assessed - Toileting Clothing Manipulation and Hygiene: Standing Transfers/Ambulation Related to ADLs: supervision.  Pt initially with mild LOB, but once up and moving, no further LOB.  Pt does demonstrate  increased difficulty when looking to Rt.  ADL Comments: Pt requires supervision.  Pt is quick to attribute deficits to sinus trouble, and cataracts.  Somewhat resistant to acknowleging potential cause of deficits.      OT Diagnosis: Disturbance of vision;Generalized weakness  OT Problem List: Decreased activity tolerance;Impaired balance (sitting and/or standing);Impaired vision/perception OT Treatment Interventions: Self-care/ADL training;Visual/perceptual remediation/compensation;Balance training;Patient/family education   OT Goals(Current goals can be found in the care plan section) Acute Rehab OT Goals Patient Stated Goal: To get better and go home OT Goal Formulation: With patient/family Time For Goal Achievement: 06/17/13 Potential to Achieve Goals: Good  Visit Information  Last OT Received On: 06/09/13 Assistance Needed: +1       Prior Functioning     Home Living Family/patient expects to be discharged to:: Private residence Living Arrangements: Alone  Available Help at Discharge: Family;Available PRN/intermittently Type  of Home: House Home Access: Level entry Home Layout: Two level;Full bath on main level;Able to live on main level with bedroom/bathroom Home Equipment: Grab bars - toilet;Grab bars - tub/shower Additional Comments: Pt just moved to [place] from [place].  Family stays with her at night Prior Function Level of Independence: Independent Communication Communication: No difficulties Dominant Hand: Right         Vision/Perception Vision - History Baseline Vision: Wears glasses only for reading Visual History: Cataracts Patient Visual Report: Diplopia Vision - Assessment Vision Assessment: Vision tested Ocular Range of Motion: Restricted on the right Tracking/Visual Pursuits: Right eye does not track laterally;Decreased smoothness of vertical tracking;Decreased smoothness of eye movement to RIGHT superior field Additional Comments: Pt limited EOM at end range abduction rt eye.  Nystagmus noted Rt. eye with Rt. gaze.  Pt and dtr were instructed in  occulomotor exercises - pt tracking thumb to Rt and holding it there for 3-5 seconds.  Pt self distracts quickly thus requiring cues to stay on task.  Quickly states that it is her sinuses and inner ear that are causing diplopia, but will acknowlege that she has never had diplopia with sinus issues previously Perception Perception: Within Functional Limits Praxis Praxis: Intact   Cognition  Cognition Arousal/Alertness: Awake/alert Behavior During Therapy: Anxious Overall Cognitive Status: Within Functional Limits for tasks assessed (Pt self distracts - appears to be baseline per dtr)    Extremity/Trunk Assessment Upper Extremity Assessment Upper Extremity Assessment: Overall WFL for tasks assessed Lower Extremity Assessment Lower Extremity Assessment: Defer to PT evaluation Cervical / Trunk Assessment Cervical / Trunk Assessment: Normal     Mobility Bed Mobility Bed Mobility: Supine to Sit;Sitting - Scoot to Edge of Bed Supine to Sit: 5:  Supervision Sitting - Scoot to Edge of Bed: 5: Supervision Transfers Transfers: Sit to Stand;Stand to Sit Sit to Stand: 4: Min guard;5: Supervision;From bed;From toilet Stand to Sit: 5: Supervision;To bed;To chair/3-in-1;To toilet Details for Transfer Assistance: Initially required min guard assist for standing due to LOB, but progressed to supervisionq     Exercise     Balance Balance Balance Assessed: Yes Dynamic Standing Balance Dynamic Standing - Balance Support: No upper extremity supported Dynamic Standing - Level of Assistance: 5: Stand by assistance Dynamic Standing - Comments: ADLs at sink and retrieving items from floor   End of Session OT - End of Session Activity Tolerance: Patient tolerated treatment well Patient left: in chair;with call bell/phone within reach;with family/visitor present Nurse Communication: Mobility status  GO Functional Limitation: Self care Self Care Current Status ([id number]): At least 1 percent but less than 20 percent impaired, limited or restricted Self Care Discharge Status [phone]): At least 1 percent but less than 20 percent impaired, limited or restricted   Anna Curtis M 06/09/2013, 11:23 AM

## 2013-06-09 NOTE — Progress Notes (Signed)
VASCULAR LAB PRELIMINARY  PRELIMINARY  PRELIMINARY  PRELIMINARY  Carotid duplex  completed.    Preliminary report:  Bilateral:  Less than 39% ICA stenosis.  Vertebral artery flow is antegrade.      Leon Montoya, RVT 06/09/2013, 3:39 PM

## 2013-06-09 NOTE — Progress Notes (Signed)
Subjective:  The patient states diplopia and gait imbalance have improved.  She does c/o recent worsening sinus congestion  Objective: Vital signs in last 24 hours: Temp:  [97.5 F (36.4 C)-98.3 F (36.8 C)] 97.8 F (36.6 C) (07/06 0517) Pulse Rate:  [58-68] 58 (07/06 0517) Resp:  [18-20] 18 (07/06 0517) BP: (124-169)/(58-96) 128/58 mmHg (07/06 0517) SpO2:  [95 %-98 %] 97 % (07/06 0517) Weight:  [58.06 kg (128 lb)] 58.06 kg (128 lb) (07/05 1700) Weight change:  Last BM Date: 06/08/13  Intake/Output from previous day: 07/05 0701 - 07/06 0700 In: 614.2 [I.V.:614.2] Out: -  Intake/Output this shift:    Cardio: regular rate and rhythm, S1, S2 normal, no murmur, click, rub or gallop Neurologic: Mental status: Alert, oriented, thought content appropriate Cranial nerves: III,IV,VI: extraocular muscles extra-ocular motions intact Motor: grossly normal  Lab Results:  Recent Labs  06/08/13 1715 06/09/13 0530  WBC 9.3 7.3  HGB 12.7 12.5  HCT 37.0 36.7  PLT 284 284   BMET  Recent Labs  06/08/13 1152 06/08/13 1216 06/08/13 1715 06/09/13 0530  NA 136 136  --  137  K 3.8 3.9  --  3.6  CL 103 104  --  106  CO2 26  --   --  24  GLUCOSE 113* 101*  --  90  BUN 13 13  --  12  CREATININE 0.54 0.60 0.42* 0.54  CALCIUM 8.8  --   --  8.5    Studies/Results: Ct Head Wo Contrast  06/08/2013   *RADIOLOGY REPORT*  Clinical Data: Ataxia.  Visual disturbances.  Difficulty focusing vision.  CT HEAD WITHOUT CONTRAST  Technique:  Contiguous axial images were obtained from the base of the skull through the vertex without contrast.  Comparison: None.  Findings: The brain stem, cerebellum, cerebral peduncles, thalami, basal ganglia, basilar cisterns, and ventricular system appear unremarkable.  Periventricular and corona radiata white matter hypodensities are most compatible with chronic ischemic microvascular white matter disease.  No intracranial hemorrhage, mass lesion, or acute infarction  is identified.  Mild chronic right sphenoid sinusitis.  Middle ears and mastoids unremarkable.  IMPRESSION:  1.  No acute intracranial findings. 2.  Mild chronic right sphenoid sinusitis. 3.  Periventricular and corona radiata white matter hypodensities are most compatible with chronic ischemic microvascular white matter disease.   Original Report Authenticated By: Gaylyn Rong, M.D.   Mr Brain Wo Contrast  06/08/2013   *RADIOLOGY REPORT*  Clinical Data: Vertigo.  Difficulty with ambulation.  MRI HEAD WITHOUT CONTRAST  Technique:  Multiplanar, multiecho pulse sequences of the brain and surrounding structures were obtained according to standard protocol without intravenous contrast.  Comparison: CT head earlier today.  Findings:  There is no evidence for acute infarction, intracranial hemorrhage, mass lesion, hydrocephalus, or extra-axial fluid. Moderate atrophy.  Extensive small vessel disease.  No foci of chronic hemorrhage.  Prominent perivascular spaces suggest longstanding hypertension.  Flow voids are maintained in the carotid and basilar arteries with both vertebrals contributing. Mild chronic sphenoid sinus disease.  Negative orbits.  No mastoid fluid.  IMPRESSION: Atrophy and chronic microvascular ischemic change.  No acute intracranial findings.   Original Report Authenticated By: Davonna Belling, M.D.    Medications:  Scheduled: . amoxicillin  500 mg Oral Q8H  . aspirin  325 mg Oral Daily  . fluticasone  1 spray Each Nare Daily  . heparin  5,000 Units Subcutaneous Q8H  . olmesartan  40 mg Oral Daily    Assessment/Plan: 1. Syndrome  of sudden onset right vision diplopia and ataxia maybe related to vestibular involvement with recent sinus infection , amoxocillin started.  Also vascular etiology being evaluated 2-d echocardiogram and carotid dopplers pending. If she continues to do well she may be ready for d/c in am   LOS: 1 day   [facility] 06/09/2013, 8:25 AM

## 2013-06-09 NOTE — Progress Notes (Signed)
Subjective: No new complaints.  has improved. Eyelid ptosis is also improved. Patient refused Lovenox.  Objective: Current vital signs: BP 128/58  Pulse 58  Temp(Src) 97.8 F (36.6 C) (Oral)  Resp 18  Ht 5\' 8"  (1.727 m)  Wt 58.06 kg (128 lb)  BMI 19.47 kg/m2  SpO2 97%  Neurologic Exam: Alert and in no acute distress. Mental status is unremarkable. Pupils were equal and reacted normally to light. Slightly reduced lateral movement of right eye on right lateral gaze; extraocular movements were normal otherwise. Slight diplopia on right lateral gaze. No eyelid ptosis noted on either side, including with upgaze for 20 seconds.. No facial weakness noted. Speech was normal. Moves extremities equally with no signs of focal weakness.  Lab Results: Results for orders placed during the hospital encounter of 06/08/13 (from the past 48 hour(s))  GLUCOSE, CAPILLARY     Status: None   Collection Time    06/08/13 11:11 AM      Result Value Range   Glucose-Capillary 97  70 - 99 mg/dL   Comment 1 Documented in Chart     Comment 2 Notify RN    ETHANOL     Status: None   Collection Time    06/08/13 11:52 AM      Result Value Range   Alcohol, Ethyl (B) <11  0 - 11 mg/dL   Comment:            LOWEST DETECTABLE LIMIT FOR     SERUM ALCOHOL IS 11 mg/dL     FOR MEDICAL PURPOSES ONLY  PROTIME-INR     Status: None   Collection Time    06/08/13 11:52 AM      Result Value Range   Prothrombin Time 11.9  11.6 - 15.2 seconds   INR 0.89  0.00 - 1.49  APTT     Status: None   Collection Time    06/08/13 11:52 AM      Result Value Range   aPTT 30  24 - 37 seconds  CBC     Status: None   Collection Time    06/08/13 11:52 AM      Result Value Range   WBC 7.6  4.0 - 10.5 K/uL   RBC 4.07  3.87 - 5.11 MIL/uL   Hemoglobin 13.0  12.0 - 15.0 g/dL   HCT 16.1  09.6 - 04.5 %   MCV 91.9  78.0 - 100.0 fL   MCH 31.9  26.0 - 34.0 pg   MCHC 34.8  30.0 - 36.0 g/dL   RDW 40.9  81.1 - 91.4 %   Platelets  352  150 - 400 K/uL  DIFFERENTIAL     Status: None   Collection Time    06/08/13 11:52 AM      Result Value Range   Neutrophils Relative % 60  43 - 77 %   Neutro Abs 4.6  1.7 - 7.7 K/uL   Lymphocytes Relative 30  12 - 46 %   Lymphs Abs 2.3  0.7 - 4.0 K/uL   Monocytes Relative 8  3 - 12 %   Monocytes Absolute 0.6  0.1 - 1.0 K/uL   Eosinophils Relative 2  0 - 5 %   Eosinophils Absolute 0.2  0.0 - 0.7 K/uL   Basophils Relative 1  0 - 1 %   Basophils Absolute 0.0  0.0 - 0.1 K/uL  COMPREHENSIVE METABOLIC PANEL     Status: Abnormal   Collection Time  06/08/13 11:52 AM      Result Value Range   Sodium 136  135 - 145 mEq/L   Potassium 3.8  3.5 - 5.1 mEq/L   Chloride 103  96 - 112 mEq/L   CO2 26  19 - 32 mEq/L   Glucose, Bld 113 (*) 70 - 99 mg/dL   BUN 13  6 - 23 mg/dL   Creatinine, Ser 1.61  0.50 - 1.10 mg/dL   Calcium 8.8  8.4 - 09.6 mg/dL   Total Protein 6.5  6.0 - 8.3 g/dL   Albumin 3.5  3.5 - 5.2 g/dL   AST 14  0 - 37 U/L   ALT 12  0 - 35 U/L   Alkaline Phosphatase 74  39 - 117 U/L   Total Bilirubin 0.7  0.3 - 1.2 mg/dL   GFR calc non Af Amer 87 (*) >90 mL/min   GFR calc Af Amer >90  >90 mL/min   Comment:            The eGFR has been calculated     using the CKD EPI equation.     This calculation has not been     validated in all clinical     situations.     eGFR's persistently     <90 mL/min signify     possible Chronic Kidney Disease.  TROPONIN I     Status: None   Collection Time    06/08/13 11:52 AM      Result Value Range   Troponin I <0.30  <0.30 ng/mL   Comment:            Due to the release kinetics of cTnI,     a negative result within the first hours     of the onset of symptoms does not rule out     myocardial infarction with certainty.     If myocardial infarction is still suspected,     repeat the test at appropriate intervals.  POCT I-STAT TROPONIN I     Status: None   Collection Time    06/08/13 12:14 PM      Result Value Range   Troponin i,  poc 0.00  0.00 - 0.08 ng/mL   Comment 3            Comment: Due to the release kinetics of cTnI,     a negative result within the first hours     of the onset of symptoms does not rule out     myocardial infarction with certainty.     If myocardial infarction is still suspected,     repeat the test at appropriate intervals.  POCT I-STAT, CHEM 8     Status: Abnormal   Collection Time    06/08/13 12:16 PM      Result Value Range   Sodium 136  135 - 145 mEq/L   Potassium 3.9  3.5 - 5.1 mEq/L   Chloride 104  96 - 112 mEq/L   BUN 13  6 - 23 mg/dL   Creatinine, Ser 0.45  0.50 - 1.10 mg/dL   Glucose, Bld 409 (*) 70 - 99 mg/dL   Calcium, Ion 8.11 (*) 1.13 - 1.30 mmol/L   TCO2 23  0 - 100 mmol/L   Hemoglobin 12.6  12.0 - 15.0 g/dL   HCT 91.4  78.2 - 95.6 %  URINE RAPID DRUG SCREEN (HOSP PERFORMED)     Status: None   Collection Time  06/08/13 12:41 PM      Result Value Range   Opiates NONE DETECTED  NONE DETECTED   Cocaine NONE DETECTED  NONE DETECTED   Benzodiazepines NONE DETECTED  NONE DETECTED   Amphetamines NONE DETECTED  NONE DETECTED   Tetrahydrocannabinol NONE DETECTED  NONE DETECTED   Barbiturates NONE DETECTED  NONE DETECTED   Comment:            DRUG SCREEN FOR MEDICAL PURPOSES     ONLY.  IF CONFIRMATION IS NEEDED     FOR ANY PURPOSE, NOTIFY LAB     WITHIN 5 DAYS.                LOWEST DETECTABLE LIMITS     FOR URINE DRUG SCREEN     Drug Class       Cutoff (ng/mL)     Amphetamine      1000     Barbiturate      200     Benzodiazepine   200     Tricyclics       300     Opiates          300     Cocaine          300     THC              50  URINALYSIS, ROUTINE W REFLEX MICROSCOPIC     Status: Abnormal   Collection Time    06/08/13 12:41 PM      Result Value Range   Color, Urine YELLOW  YELLOW   APPearance CLOUDY (*) CLEAR   Specific Gravity, Urine 1.014  1.005 - 1.030   pH 7.0  5.0 - 8.0   Glucose, UA NEGATIVE  NEGATIVE mg/dL   Hgb urine dipstick TRACE (*)  NEGATIVE   Bilirubin Urine NEGATIVE  NEGATIVE   Ketones, ur NEGATIVE  NEGATIVE mg/dL   Protein, ur NEGATIVE  NEGATIVE mg/dL   Urobilinogen, UA 0.2  0.0 - 1.0 mg/dL   Nitrite NEGATIVE  NEGATIVE   Leukocytes, UA NEGATIVE  NEGATIVE  URINE MICROSCOPIC-ADD ON     Status: None   Collection Time    06/08/13 12:41 PM      Result Value Range   Squamous Epithelial / LPF RARE  RARE   WBC, UA 0-2  <3 WBC/hpf   RBC / HPF 0-2  <3 RBC/hpf  CBC     Status: None   Collection Time    06/08/13  5:15 PM      Result Value Range   WBC 9.3  4.0 - 10.5 K/uL   RBC 4.05  3.87 - 5.11 MIL/uL   Hemoglobin 12.7  12.0 - 15.0 g/dL   HCT 09.8  11.9 - 14.7 %   MCV 91.4  78.0 - 100.0 fL   MCH 31.4  26.0 - 34.0 pg   MCHC 34.3  30.0 - 36.0 g/dL   RDW 82.9  56.2 - 13.0 %   Platelets 284  150 - 400 K/uL  CREATININE, SERUM     Status: Abnormal   Collection Time    06/08/13  5:15 PM      Result Value Range   Creatinine, Ser 0.42 (*) 0.50 - 1.10 mg/dL   GFR calc non Af Amer >90  >90 mL/min   GFR calc Af Amer >90  >90 mL/min   Comment:            The eGFR has been calculated  using the CKD EPI equation.     This calculation has not been     validated in all clinical     situations.     eGFR's persistently     <90 mL/min signify     possible Chronic Kidney Disease.  GLUCOSE, CAPILLARY     Status: None   Collection Time    06/08/13  9:10 PM      Result Value Range   Glucose-Capillary 99  70 - 99 mg/dL   Comment 1 Documented in Chart     Comment 2 Notify RN    CBC     Status: None   Collection Time    06/09/13  5:30 AM      Result Value Range   WBC 7.3  4.0 - 10.5 K/uL   RBC 3.98  3.87 - 5.11 MIL/uL   Hemoglobin 12.5  12.0 - 15.0 g/dL   HCT 65.7  84.6 - 96.2 %   MCV 92.2  78.0 - 100.0 fL   MCH 31.4  26.0 - 34.0 pg   MCHC 34.1  30.0 - 36.0 g/dL   RDW 95.2  84.1 - 32.4 %   Platelets 284  150 - 400 K/uL  BASIC METABOLIC PANEL     Status: Abnormal   Collection Time    06/09/13  5:30 AM      Result  Value Range   Sodium 137  135 - 145 mEq/L   Potassium 3.6  3.5 - 5.1 mEq/L   Chloride 106  96 - 112 mEq/L   CO2 24  19 - 32 mEq/L   Glucose, Bld 90  70 - 99 mg/dL   BUN 12  6 - 23 mg/dL   Creatinine, Ser 4.01  0.50 - 1.10 mg/dL   Calcium 8.5  8.4 - 02.7 mg/dL   GFR calc non Af Amer 87 (*) >90 mL/min   GFR calc Af Amer >90  >90 mL/min   Comment:            The eGFR has been calculated     using the CKD EPI equation.     This calculation has not been     validated in all clinical     situations.     eGFR's persistently     <90 mL/min signify     possible Chronic Kidney Disease.    Studies/Results: Ct Head Wo Contrast  06/08/2013   *RADIOLOGY REPORT*  Clinical Data: Ataxia.  Visual disturbances.  Difficulty focusing vision.  CT HEAD WITHOUT CONTRAST  Technique:  Contiguous axial images were obtained from the base of the skull through the vertex without contrast.  Comparison: None.  Findings: The brain stem, cerebellum, cerebral peduncles, thalami, basal ganglia, basilar cisterns, and ventricular system appear unremarkable.  Periventricular and corona radiata white matter hypodensities are most compatible with chronic ischemic microvascular white matter disease.  No intracranial hemorrhage, mass lesion, or acute infarction is identified.  Mild chronic right sphenoid sinusitis.  Middle ears and mastoids unremarkable.  IMPRESSION:  1.  No acute intracranial findings. 2.  Mild chronic right sphenoid sinusitis. 3.  Periventricular and corona radiata white matter hypodensities are most compatible with chronic ischemic microvascular white matter disease.   Original Report Authenticated By: Gaylyn Rong, M.D.   Mr Brain Wo Contrast  06/08/2013   *RADIOLOGY REPORT*  Clinical Data: Vertigo.  Difficulty with ambulation.  MRI HEAD WITHOUT CONTRAST  Technique:  Multiplanar, multiecho pulse sequences of the brain and surrounding structures were  obtained according to standard protocol without intravenous  contrast.  Comparison: CT head earlier today.  Findings:  There is no evidence for acute infarction, intracranial hemorrhage, mass lesion, hydrocephalus, or extra-axial fluid. Moderate atrophy.  Extensive small vessel disease.  No foci of chronic hemorrhage.  Prominent perivascular spaces suggest longstanding hypertension.  Flow voids are maintained in the carotid and basilar arteries with both vertebrals contributing. Mild chronic sphenoid sinus disease.  Negative orbits.  No mastoid fluid.  IMPRESSION: Atrophy and chronic microvascular ischemic change.  No acute intracranial findings.   Original Report Authenticated By: Davonna Belling, M.D.    Medications:  Scheduled: . amoxicillin  500 mg Oral Q8H  . aspirin  325 mg Oral Daily  . fluticasone  1 spray Each Nare Daily  . heparin  5,000 Units Subcutaneous Q8H  . olmesartan  40 mg Oral Daily   Continuous: . sodium chloride 50 mL/hr at 06/08/13 1743   BMW:[id number]  Assessment/Plan: Acute diplopia and ptosis, most likely secondary to small vessel midbrain ischemic infarction. Myasthenia gravis cannot be ruled out at this point, however.  Recommend continuing stroke risk assessment as planned. Myasthenia gravis panel is pending. We will continue to follow this patient with you.  C.R. Roseanne Reno, MD [facility] [phone]  06/09/2013  8:55 AM

## 2013-06-10 DIAGNOSIS — G459 Transient cerebral ischemic attack, unspecified: Secondary | ICD-10-CM | POA: Diagnosis not present

## 2013-06-10 DIAGNOSIS — R279 Unspecified lack of coordination: Secondary | ICD-10-CM | POA: Diagnosis not present

## 2013-06-10 DIAGNOSIS — F17200 Nicotine dependence, unspecified, uncomplicated: Secondary | ICD-10-CM | POA: Diagnosis not present

## 2013-06-10 DIAGNOSIS — H532 Diplopia: Secondary | ICD-10-CM | POA: Diagnosis not present

## 2013-06-10 MED ORDER — AMOXICILLIN 500 MG PO CAPS: 500.0000 mg | ORAL_CAPSULE | Freq: Three times a day (TID) | ORAL | Status: AC

## 2013-06-10 MED ORDER — FLUTICASONE PROPIONATE 50 MCG/ACT NA SUSP: NASAL | Status: DC

## 2013-06-10 NOTE — Progress Notes (Signed)
Patient has been refusing heparin injection even after talking about the importance of it. Left a note to Dr. Kevan Ny about it.

## 2013-06-10 NOTE — Progress Notes (Signed)
Pt and her daughter have picked [facility] to provide HHPT/OT services, referral made.  Algernon Huxley RN BSN   [phone]

## 2013-06-10 NOTE — Progress Notes (Signed)
Patient discharge to home, alert and oriented, family at bedside, D/c instructions and follow up appointment done and given to the patient.verbalized understanding.PIV removed no s/s of infiltration and swelling on insertion site.

## 2013-06-10 NOTE — Progress Notes (Signed)
06/09/13 1707  PT Time Calculation  PT Start Time 1644  PT Stop Time 1702  PT Time Calculation (min) 18 min  PT G-Codes **NOT FOR INPATIENT CLASS**  Functional Assessment Tool Used (clinical judgement)  Functional Limitation Mobility: Walking and moving around  Mobility: Walking and Moving Around Current Status (N5621) CI  Mobility: Walking and Moving Around Goal Status (H0865) CH  PT General Charges  $$ ACUTE PT VISIT 1 Procedure  PT Evaluation  $Initial PT Evaluation Tier I 1 Procedure  PT Treatments  $Gait Training 8-22 mins    G-code Late Entry for eval on 7/6 by Drucilla Chalet, PT Entered by Rebeca Alert, PT

## 2013-06-10 NOTE — Progress Notes (Signed)
NEURO HOSPITALIST PROGRESS NOTE   SUBJECTIVE:                                                                                                                        Patient states she has improved.  Her diplopia when looking to the right has improved but her eyes continue to water.   OBJECTIVE:                                                                                                                           Vital signs in last 24 hours: Temp:  [97.6 F (36.4 C)-98.4 F (36.9 C)] 98.4 F (36.9 C) (07/07 0558) Pulse Rate:  [56-68] 58 (07/07 0558) Resp:  [16-20] 16 (07/07 0558) BP: (119-180)/(47-63) 133/48 mmHg (07/07 0558) SpO2:  [98 %-100 %] 98 % (07/07 0558)  Intake/Output from previous day: 07/06 0701 - 07/07 0700 In: 1080 [P.O.:1080] Out: -  Intake/Output this shift:   Nutritional status: Cardiac  Past Medical History  Diagnosis Date  . Cancer     breast 2002  . Hypertension   . Bladder prolapse, female, acquired      Neurologic Exam:  Mental Status: Alert, oriented, thought content appropriate.  Speech fluent without evidence of aphasia.  Able to follow 3 step commands without difficulty. Cranial Nerves: II:  Visual fields grossly normal, pupils equal, round, reactive to light and accommodation III,IV, VI: ptosis not present, extra-ocular motions intact bilaterally, slight nystagmus in far gaze when looking to the right.  V,VII: smile symmetric, facial light touch sensation normal bilaterally VIII: hearing normal bilaterally IX,X: gag reflex present XI: bilateral shoulder shrug XII: midline tongue extension Motor: Right : Upper extremity   5/5    Left:     Upper extremity   5/5  Lower extremity   5/5     Lower extremity   5/5 Tone and bulk:normal tone throughout; no atrophy noted Sensory: Pinprick and light touch intact throughout, bilaterally Deep Tendon Reflexes:  Right: Upper Extremity   Left: Upper extremity    biceps (C-5 to C-6) 2/4   biceps (C-5 to C-6) 2/4 tricep (C7) 2/4    triceps (C7) 2/4 Brachioradialis (C6) 2/4  Brachioradialis (C6) 2/4  Lower Extremity Lower Extremity  quadriceps (L-2 to L-4) 2/4   quadriceps (L-2 to L-4) 2/4 Achilles (S1) 2/4   Achilles (S1) 2/4  Plantars: Right: downgoing   Left: downgoing Cerebellar: normal finger-to-nose,  normal heel-to-shin test    Lab Results: Lab Results  Component Value Date/Time   CHOL 207* 06/09/2013  5:30 AM   Lipid Panel  Recent Labs  06/09/13 0530  CHOL 207*  TRIG 94  HDL 63  CHOLHDL 3.3  VLDL 19  LDLCALC 161*    Studies/Results: Ct Head Wo Contrast  06/08/2013   *RADIOLOGY REPORT*  Clinical Data: Ataxia.  Visual disturbances.  Difficulty focusing vision.  CT HEAD WITHOUT CONTRAST  Technique:  Contiguous axial images were obtained from the base of the skull through the vertex without contrast.  Comparison: None.  Findings: The brain stem, cerebellum, cerebral peduncles, thalami, basal ganglia, basilar cisterns, and ventricular system appear unremarkable.  Periventricular and corona radiata white matter hypodensities are most compatible with chronic ischemic microvascular white matter disease.  No intracranial hemorrhage, mass lesion, or acute infarction is identified.  Mild chronic right sphenoid sinusitis.  Middle ears and mastoids unremarkable.  IMPRESSION:  1.  No acute intracranial findings. 2.  Mild chronic right sphenoid sinusitis. 3.  Periventricular and corona radiata white matter hypodensities are most compatible with chronic ischemic microvascular white matter disease.   Original Report Authenticated By: Gaylyn Rong, M.D.   Mr Brain Wo Contrast  06/08/2013   *RADIOLOGY REPORT*  Clinical Data: Vertigo.  Difficulty with ambulation.  MRI HEAD WITHOUT CONTRAST  Technique:  Multiplanar, multiecho pulse sequences of the brain and surrounding structures were obtained according to standard protocol without intravenous  contrast.  Comparison: CT head earlier today.  Findings:  There is no evidence for acute infarction, intracranial hemorrhage, mass lesion, hydrocephalus, or extra-axial fluid. Moderate atrophy.  Extensive small vessel disease.  No foci of chronic hemorrhage.  Prominent perivascular spaces suggest longstanding hypertension.  Flow voids are maintained in the carotid and basilar arteries with both vertebrals contributing. Mild chronic sphenoid sinus disease.  Negative orbits.  No mastoid fluid.  IMPRESSION: Atrophy and chronic microvascular ischemic change.  No acute intracranial findings.   Original Report Authenticated By: Davonna Belling, M.D.   2 D echo: Study Conclusions  - Left ventricle: The cavity size was normal. Wall thickness was normal. Systolic function was normal. The estimated ejection fraction was in the range of 60% to 65%. Wall motion was normal; there were no regional wall motion abnormalities. Left ventricular diastolic function parameters were normal. - Mitral valve: Calcified annulus. Impressions:  - No cardiac source of emboli was indentified. Transthoracic echocardiography. M-mode, complete 2D, spectral Doppler, and color Doppler. Height: Height: 172.7cm. Height: 68in. Weight: Weight: 58.1kg. Weight: 127.8lb. Body mass index: BMI: 19.5kg/m^2. Body surface area: BSA: 1.75m^2. Blood pressure: 128/58. Patient status: Inpatient. Location: Bedside.  Carotid Doppler: Final PENDING Preliminary report: Bilateral: Less than 39% ICA stenosis. Vertebral artery flow is antegrade   A1c: 5.4 Myasthenia gravis panel Pending--of note these panels get read on "Monday mornings by the lab and may take 3-5 days to return" as noted by the lab  MEDICATIONS  Scheduled: . amoxicillin  500 mg Oral Q8H  . aspirin  325 mg Oral Daily  . fluticasone  1 spray Each Nare Daily  .  olmesartan  40 mg Oral Daily    ASSESSMENT/PLAN:                                                                                                             Acute diplopia and ptosis, most likely secondary to small vessel midbrain ischemic infarction. Myasthenia gravis cannot be ruled out at this point, however.  Patient is now on ASA daily and both carotid doppler and Echo ave resulted in normal limits. Myasthenia panel is pending and may be followed up asa out patient as patient has been discharged per primary.    No further recommendations.    Assessment and plan discussed with with attending physician and they are in agreement.    Felicie Morn PA-C [facility] [phone]  06/10/2013, 8:07 AM

## 2013-06-10 NOTE — Progress Notes (Signed)
Physical Therapy Treatment Patient Details Name: Anna Curtis MRN: [id number] DOB: May 13, 1931 Today's Date: 06/10/2013 Time: 4098-1191 PT Time Calculation (min): 15 min  PT Assessment / Plan / Recommendation  PT Comments   Mobility improving. Pt was supervision level today. Performed well. Plan is for d/c home today. MD has already ordered HHPT for pt-may only be a couple of visits. Discussed possible need for OP PT if pt does not continue to progress.   Follow Up Recommendations  HHPT/home safety evaluation; Intermittent supervision initially.      Does the patient have the potential to tolerate intense rehabilitation     Barriers to Discharge        Equipment Recommendations  None recommended by PT    Recommendations for Other Services OT consult  Frequency Min 3X/week   Progress towards PT Goals Progress towards PT goals: Progressing toward goals  Plan Current plan remains appropriate    Precautions / Restrictions Precautions Precautions: Fall Restrictions Weight Bearing Restrictions: No   Pertinent Vitals/Pain No c/o pain    Mobility  Bed Mobility Bed Mobility: Supine to Sit Supine to Sit: 6: Modified independent (Device/Increase time) Transfers Transfers: Sit to Stand;Stand to Sit Sit to Stand: 6: Modified independent (Device/Increase time);From bed Stand to Sit: 6: Modified independent (Device/Increase time);To bed Ambulation/Gait Ambulation/Gait Assistance: 5: Supervision Ambulation Distance (Feet): 350 Feet Assistive device: None Gait Pattern: Step-through pattern Stairs: No    Exercises     PT Diagnosis:    PT Problem List:   PT Treatment Interventions:     PT Goals (current goals can now be found in the care plan section)    Visit Information  Last PT Received On: 06/10/13 Assistance Needed: +1 History of Present Illness: ataxia, diplopia. small midbrain infarction. neurology questioning myasthenia gravis???    Subjective Data       Cognition  Cognition Arousal/Alertness: Awake/alert Behavior During Therapy: Restless Overall Cognitive Status: Within Functional Limits for tasks assessed    Balance  Balance Balance Assessed: Yes Dynamic Standing Balance Dynamic Standing - Balance Support: No upper extremity supported Dynamic Standing - Comments: Pt still has difficulty maintaining SLS longer than 1-2 seconds on R foot. Pt demonstrated good balance strategies by lowering foot/grabbing for rail to hold onto with unsteadiness/LOB.  High Level Balance High Level Balance Activites: Side stepping;Backward walking;Direction changes;Turns;Sudden stops;Head turns High Level Balance Comments: some wavering noted with head turns R and L. Otherwise, pt performed other tasks without difficulty/LOB.   End of Session PT - End of Session Equipment Utilized During Treatment: Gait belt Activity Tolerance: Patient tolerated treatment well Patient left: in bed;with call bell/phone within reach;with family/visitor present   GP     Rebeca Alert, MPT Pager: [phone]

## 2013-06-10 NOTE — Discharge Summary (Signed)
Physician Discharge Summary  NAME:Anna Curtis  WUJ:[id number]  DOB: 1931-03-08   Admit date: 06/08/2013 Discharge date: 06/10/2013  Discharge Diagnoses:  Active Problems:   TIA - most likely secondary to small vessel midbrain and ischemic infarction.  Myasthenia gravis studies pending.  No obvious stroke on CT scanning and MRI MRI of the brain   Ataxia - improved   Diplopia - resolved   Dizziness - improved   Sinusitis - improving   HTN (hypertension) - controlled   Tobacco abuse - aware   Discharge Physical Exam:  General Appearance: Alert, cooperative, no distress, appears stated age  Weight change:   Intake/Output Summary (Last 24 hours) at 06/10/13 0804 Last data filed at 06/09/13 2300  Gross per 24 hour  Intake   1080 ml  Output      0 ml  Net   1080 ml   Filed Vitals:   06/09/13 2122 06/09/13 2144 06/10/13 0131 06/10/13 0558  BP: 180/61 156/62 128/47 133/48  Pulse: 64  68 58  Temp: 97.8 F (36.6 C)  98.2 F (36.8 C) 98.4 F (36.9 C)  TempSrc: Oral  Oral Oral  Resp: 20  18 16   Height:      Weight:      SpO2: 99%  98% 98%   Alert and in no acute distress. Mental status is unremarkable.  Pupils were equal and reacted normally to light.  Normal lateral movement of right eye on right lateral gaze Slight diplopia on right lateral gaze.  No eyelid ptosis noted on either side  No facial weakness noted.  Speech was normal.    Discharge Condition: Improved  Hospital Course:  79 year old female with history of tobacco abuse and hypertension who noticed on July 4 of the acute onset of dizziness and difficulty with gait late in the day.  She went to bed and awakened in the morning feeling worse.  Again gait was difficult and she had double vision which look to the right.  She came to the emergency room and was noted to have some right eye ptosis and nystagmus and disconjugate eye movements when looking to the right.  He was found to have chronic sphenoid sinusitis as  well.  MRI and CT scan revealed no obvious stroke but she did have atrophy and chronic microvascular ischemic changes.  Labs are still pending to rule out myasthenia gravis.  She has refused Lovenox while hospitalized but does except aspirin therapy.  She has steadily improved since she has been here.  Echocardiogram revealed no source of emboli and carotid Dopplers revealed less than 39% blockage bilaterally.  She has improved almost to baseline and seems to have gotten better on amoxicillin therapy for her right sphenoid sinusitis.  She attributes all of her symptoms to the sinusitis.  She ambulated yesterday afternoon and if steady today on her feet can be discharged home on aspirin with followup in 7-10 days.  Will plan for home physical therapy  Things to follow up in the outpatient setting: Gait and vision and sinusitis  Consults: Treatment Team:  Kym Groom, MD  Disposition: Discharge to home with home health physical therapy and occupational therapy  Discharge Orders   Future Orders Complete By Expires     Call MD for:  difficulty breathing, headache or visual disturbances  As directed     Call MD for:  persistant dizziness or light-headedness  As directed     Call MD for:  temperature >100.4  As directed  Diet - low sodium heart healthy  As directed     Discharge instructions  As directed     Comments:      Home physical therapy for 2 weeks to assess gait and monitor physical abilities    Increase activity slowly  As directed         Medication List         amoxicillin 500 MG capsule  Commonly known as:  AMOXIL  Take 1 capsule (500 mg total) by mouth every 8 (eight) hours.     aspirin 325 MG tablet  Take 325 mg by mouth daily.     fluticasone 50 MCG/ACT nasal spray  Commonly known as:  FLONASE  1 spray each nostril twice a day     olmesartan 40 MG tablet  Commonly known as:  BENICAR  Take 40 mg by mouth daily.         The results of significant  diagnostics from this hospitalization (including imaging, microbiology, ancillary and laboratory) are listed below for reference.    Significant Diagnostic Studies: Ct Head Wo Contrast  06/08/2013   *RADIOLOGY REPORT*  Clinical Data: Ataxia.  Visual disturbances.  Difficulty focusing vision.  CT HEAD WITHOUT CONTRAST  Technique:  Contiguous axial images were obtained from the base of the skull through the vertex without contrast.  Comparison: None.  Findings: The brain stem, cerebellum, cerebral peduncles, thalami, basal ganglia, basilar cisterns, and ventricular system appear unremarkable.  Periventricular and corona radiata white matter hypodensities are most compatible with chronic ischemic microvascular white matter disease.  No intracranial hemorrhage, mass lesion, or acute infarction is identified.  Mild chronic right sphenoid sinusitis.  Middle ears and mastoids unremarkable.  IMPRESSION:  1.  No acute intracranial findings. 2.  Mild chronic right sphenoid sinusitis. 3.  Periventricular and corona radiata white matter hypodensities are most compatible with chronic ischemic microvascular white matter disease.   Original Report Authenticated By: Gaylyn Rong, M.D.   Mr Brain Wo Contrast  06/08/2013   *RADIOLOGY REPORT*  Clinical Data: Vertigo.  Difficulty with ambulation.  MRI HEAD WITHOUT CONTRAST  Technique:  Multiplanar, multiecho pulse sequences of the brain and surrounding structures were obtained according to standard protocol without intravenous contrast.  Comparison: CT head earlier today.  Findings:  There is no evidence for acute infarction, intracranial hemorrhage, mass lesion, hydrocephalus, or extra-axial fluid. Moderate atrophy.  Extensive small vessel disease.  No foci of chronic hemorrhage.  Prominent perivascular spaces suggest longstanding hypertension.  Flow voids are maintained in the carotid and basilar arteries with both vertebrals contributing. Mild chronic sphenoid sinus  disease.  Negative orbits.  No mastoid fluid.  IMPRESSION: Atrophy and chronic microvascular ischemic change.  No acute intracranial findings.   Original Report Authenticated By: Davonna Belling, M.D.    Microbiology: No results found for this or any previous visit (from the past 240 hour(s)).   Labs: Results for orders placed during the hospital encounter of 06/08/13  GLUCOSE, CAPILLARY      Result Value Range   Glucose-Capillary 97  70 - 99 mg/dL   Comment 1 Documented in Chart     Comment 2 Notify RN    ETHANOL      Result Value Range   Alcohol, Ethyl (B) <11  0 - 11 mg/dL  PROTIME-INR      Result Value Range   Prothrombin Time 11.9  11.6 - 15.2 seconds   INR 0.89  0.00 - 1.49  APTT  Result Value Range   aPTT 30  24 - 37 seconds  CBC      Result Value Range   WBC 7.6  4.0 - 10.5 K/uL   RBC 4.07  3.87 - 5.11 MIL/uL   Hemoglobin 13.0  12.0 - 15.0 g/dL   HCT 16.1  09.6 - 04.5 %   MCV 91.9  78.0 - 100.0 fL   MCH 31.9  26.0 - 34.0 pg   MCHC 34.8  30.0 - 36.0 g/dL   RDW 40.9  81.1 - 91.4 %   Platelets 352  150 - 400 K/uL  DIFFERENTIAL      Result Value Range   Neutrophils Relative % 60  43 - 77 %   Neutro Abs 4.6  1.7 - 7.7 K/uL   Lymphocytes Relative 30  12 - 46 %   Lymphs Abs 2.3  0.7 - 4.0 K/uL   Monocytes Relative 8  3 - 12 %   Monocytes Absolute 0.6  0.1 - 1.0 K/uL   Eosinophils Relative 2  0 - 5 %   Eosinophils Absolute 0.2  0.0 - 0.7 K/uL   Basophils Relative 1  0 - 1 %   Basophils Absolute 0.0  0.0 - 0.1 K/uL  COMPREHENSIVE METABOLIC PANEL      Result Value Range   Sodium 136  135 - 145 mEq/L   Potassium 3.8  3.5 - 5.1 mEq/L   Chloride 103  96 - 112 mEq/L   CO2 26  19 - 32 mEq/L   Glucose, Bld 113 (*) 70 - 99 mg/dL   BUN 13  6 - 23 mg/dL   Creatinine, Ser 7.82  0.50 - 1.10 mg/dL   Calcium 8.8  8.4 - 95.6 mg/dL   Total Protein 6.5  6.0 - 8.3 g/dL   Albumin 3.5  3.5 - 5.2 g/dL   AST 14  0 - 37 U/L   ALT 12  0 - 35 U/L   Alkaline Phosphatase 74  39 - 117  U/L   Total Bilirubin 0.7  0.3 - 1.2 mg/dL   GFR calc non Af Amer 87 (*) >90 mL/min   GFR calc Af Amer >90  >90 mL/min  TROPONIN I      Result Value Range   Troponin I <0.30  <0.30 ng/mL  URINE RAPID DRUG SCREEN (HOSP PERFORMED)      Result Value Range   Opiates NONE DETECTED  NONE DETECTED   Cocaine NONE DETECTED  NONE DETECTED   Benzodiazepines NONE DETECTED  NONE DETECTED   Amphetamines NONE DETECTED  NONE DETECTED   Tetrahydrocannabinol NONE DETECTED  NONE DETECTED   Barbiturates NONE DETECTED  NONE DETECTED  URINALYSIS, ROUTINE W REFLEX MICROSCOPIC      Result Value Range   Color, Urine YELLOW  YELLOW   APPearance CLOUDY (*) CLEAR   Specific Gravity, Urine 1.014  1.005 - 1.030   pH 7.0  5.0 - 8.0   Glucose, UA NEGATIVE  NEGATIVE mg/dL   Hgb urine dipstick TRACE (*) NEGATIVE   Bilirubin Urine NEGATIVE  NEGATIVE   Ketones, ur NEGATIVE  NEGATIVE mg/dL   Protein, ur NEGATIVE  NEGATIVE mg/dL   Urobilinogen, UA 0.2  0.0 - 1.0 mg/dL   Nitrite NEGATIVE  NEGATIVE   Leukocytes, UA NEGATIVE  NEGATIVE  URINE MICROSCOPIC-ADD ON      Result Value Range   Squamous Epithelial / LPF RARE  RARE   WBC, UA 0-2  <3 WBC/hpf   RBC /  HPF 0-2  <3 RBC/hpf  HEMOGLOBIN A1C      Result Value Range   Hemoglobin A1C 5.4  <5.7 %   Mean Plasma Glucose 108  <117 mg/dL  CBC      Result Value Range   WBC 9.3  4.0 - 10.5 K/uL   RBC 4.05  3.87 - 5.11 MIL/uL   Hemoglobin 12.7  12.0 - 15.0 g/dL   HCT 09.6  04.5 - 40.9 %   MCV 91.4  78.0 - 100.0 fL   MCH 31.4  26.0 - 34.0 pg   MCHC 34.3  30.0 - 36.0 g/dL   RDW 81.1  91.4 - 78.2 %   Platelets 284  150 - 400 K/uL  CREATININE, SERUM      Result Value Range   Creatinine, Ser 0.42 (*) 0.50 - 1.10 mg/dL   GFR calc non Af Amer >90  >90 mL/min   GFR calc Af Amer >90  >90 mL/min  TSH      Result Value Range   TSH 0.420  0.350 - 4.500 uIU/mL  LIPID PANEL      Result Value Range   Cholesterol 207 (*) 0 - 200 mg/dL   Triglycerides 94  <956 mg/dL   HDL  63  >21 mg/dL   Total CHOL/HDL Ratio 3.3     VLDL 19  0 - 40 mg/dL   LDL Cholesterol 308 (*) 0 - 99 mg/dL  CBC      Result Value Range   WBC 7.3  4.0 - 10.5 K/uL   RBC 3.98  3.87 - 5.11 MIL/uL   Hemoglobin 12.5  12.0 - 15.0 g/dL   HCT 65.7  84.6 - 96.2 %   MCV 92.2  78.0 - 100.0 fL   MCH 31.4  26.0 - 34.0 pg   MCHC 34.1  30.0 - 36.0 g/dL   RDW 95.2  84.1 - 32.4 %   Platelets 284  150 - 400 K/uL  BASIC METABOLIC PANEL      Result Value Range   Sodium 137  135 - 145 mEq/L   Potassium 3.6  3.5 - 5.1 mEq/L   Chloride 106  96 - 112 mEq/L   CO2 24  19 - 32 mEq/L   Glucose, Bld 90  70 - 99 mg/dL   BUN 12  6 - 23 mg/dL   Creatinine, Ser 4.01  0.50 - 1.10 mg/dL   Calcium 8.5  8.4 - 02.7 mg/dL   GFR calc non Af Amer 87 (*) >90 mL/min   GFR calc Af Amer >90  >90 mL/min  GLUCOSE, CAPILLARY      Result Value Range   Glucose-Capillary 99  70 - 99 mg/dL   Comment 1 Documented in Chart     Comment 2 Notify RN    GLUCOSE, CAPILLARY      Result Value Range   Glucose-Capillary 89  70 - 99 mg/dL   Comment 1 Documented in Chart     Comment 2 Notify RN    GLUCOSE, CAPILLARY      Result Value Range   Glucose-Capillary 101 (*) 70 - 99 mg/dL   Comment 1 Documented in Chart     Comment 2 Notify RN    GLUCOSE, CAPILLARY      Result Value Range   Glucose-Capillary 109 (*) 70 - 99 mg/dL   Comment 1 Documented in Chart     Comment 2 Notify RN    GLUCOSE, CAPILLARY  Result Value Range   Glucose-Capillary 101 (*) 70 - 99 mg/dL   Comment 1 Notify RN    GLUCOSE, CAPILLARY      Result Value Range   Glucose-Capillary 92  70 - 99 mg/dL  POCT I-STAT, CHEM 8      Result Value Range   Sodium 136  135 - 145 mEq/L   Potassium 3.9  3.5 - 5.1 mEq/L   Chloride 104  96 - 112 mEq/L   BUN 13  6 - 23 mg/dL   Creatinine, Ser 1.61  0.50 - 1.10 mg/dL   Glucose, Bld 096 (*) 70 - 99 mg/dL   Calcium, Ion 0.45 (*) 1.13 - 1.30 mmol/L   TCO2 23  0 - 100 mmol/L   Hemoglobin 12.6  12.0 - 15.0 g/dL   HCT  40.9  81.1 - 91.4 %  POCT I-STAT TROPONIN I      Result Value Range   Troponin i, poc 0.00  0.00 - 0.08 ng/mL   Comment 3             Time coordinating discharge: 35 minutes  Signed: Pearla Dubonnet, MD 06/10/2013, 8:04 AM

## 2013-06-11 LAB — STRIATED MUSCLE ANTIBODY: Striated Muscle Ab: <1:40 {titer}

## 2013-06-13 DIAGNOSIS — N952 Postmenopausal atrophic vaginitis: Secondary | ICD-10-CM | POA: Diagnosis not present

## 2013-06-13 DIAGNOSIS — N318 Other neuromuscular dysfunction of bladder: Secondary | ICD-10-CM | POA: Diagnosis not present

## 2013-06-13 DIAGNOSIS — N8111 Cystocele, midline: Secondary | ICD-10-CM | POA: Diagnosis not present

## 2013-06-16 LAB — MYASTHENIA GRAVIS PANEL 2
Acetylchol Block Ab: <15 % of inhibition (ref <15)
Acetylcholine Modulat Ab: 1 %

## 2013-06-19 DIAGNOSIS — L821 Other seborrheic keratosis: Secondary | ICD-10-CM | POA: Diagnosis not present

## 2013-06-19 DIAGNOSIS — Z1331 Encounter for screening for depression: Secondary | ICD-10-CM | POA: Diagnosis not present

## 2013-06-19 DIAGNOSIS — G459 Transient cerebral ischemic attack, unspecified: Secondary | ICD-10-CM | POA: Diagnosis not present

## 2013-06-19 DIAGNOSIS — F17200 Nicotine dependence, unspecified, uncomplicated: Secondary | ICD-10-CM | POA: Diagnosis not present

## 2013-06-19 DIAGNOSIS — H532 Diplopia: Secondary | ICD-10-CM | POA: Diagnosis not present

## 2013-06-19 DIAGNOSIS — J329 Chronic sinusitis, unspecified: Secondary | ICD-10-CM | POA: Diagnosis not present

## 2013-09-20 DIAGNOSIS — G459 Transient cerebral ischemic attack, unspecified: Secondary | ICD-10-CM | POA: Diagnosis not present

## 2013-09-20 DIAGNOSIS — I10 Essential (primary) hypertension: Secondary | ICD-10-CM | POA: Diagnosis not present

## 2013-09-20 DIAGNOSIS — H532 Diplopia: Secondary | ICD-10-CM | POA: Diagnosis not present

## 2013-09-20 DIAGNOSIS — F17200 Nicotine dependence, unspecified, uncomplicated: Secondary | ICD-10-CM | POA: Diagnosis not present

## 2013-09-20 DIAGNOSIS — J329 Chronic sinusitis, unspecified: Secondary | ICD-10-CM | POA: Diagnosis not present

## 2014-05-01 DIAGNOSIS — H2510 Age-related nuclear cataract, unspecified eye: Secondary | ICD-10-CM | POA: Diagnosis not present

## 2014-07-18 ENCOUNTER — Other Ambulatory Visit: Payer: Self-pay | Admitting: Internal Medicine

## 2014-07-18 DIAGNOSIS — E559 Vitamin D deficiency, unspecified: Secondary | ICD-10-CM | POA: Diagnosis not present

## 2014-07-18 DIAGNOSIS — I10 Essential (primary) hypertension: Secondary | ICD-10-CM | POA: Diagnosis not present

## 2014-07-18 DIAGNOSIS — H532 Diplopia: Secondary | ICD-10-CM | POA: Diagnosis not present

## 2014-07-18 DIAGNOSIS — G459 Transient cerebral ischemic attack, unspecified: Secondary | ICD-10-CM | POA: Diagnosis not present

## 2014-07-18 DIAGNOSIS — Z1331 Encounter for screening for depression: Secondary | ICD-10-CM | POA: Diagnosis not present

## 2014-07-18 DIAGNOSIS — J309 Allergic rhinitis, unspecified: Secondary | ICD-10-CM | POA: Diagnosis not present

## 2014-07-18 DIAGNOSIS — Z1231 Encounter for screening mammogram for malignant neoplasm of breast: Secondary | ICD-10-CM

## 2014-07-18 DIAGNOSIS — Z0000 Encounter for general adult medical examination without abnormal findings: Secondary | ICD-10-CM | POA: Diagnosis not present

## 2014-07-18 DIAGNOSIS — J329 Chronic sinusitis, unspecified: Secondary | ICD-10-CM | POA: Diagnosis not present

## 2014-08-08 ENCOUNTER — Encounter (INDEPENDENT_AMBULATORY_CARE_PROVIDER_SITE_OTHER): Payer: Self-pay

## 2014-08-08 ENCOUNTER — Ambulatory Visit
Admission: RE | Admit: 2014-08-08 | Discharge: 2014-08-08 | Disposition: A | Discharge status: Home / Self Care | Payer: Medicare Other | Source: Ambulatory Visit | Attending: Internal Medicine | Admitting: Internal Medicine

## 2014-08-08 DIAGNOSIS — Z1231 Encounter for screening mammogram for malignant neoplasm of breast: Secondary | ICD-10-CM

## 2014-10-19 ENCOUNTER — Emergency Department (HOSPITAL_COMMUNITY): Payer: Medicare Other

## 2014-10-19 ENCOUNTER — Encounter (HOSPITAL_COMMUNITY): Payer: Self-pay | Admitting: *Deleted

## 2014-10-19 ENCOUNTER — Emergency Department (HOSPITAL_COMMUNITY)
Admission: EM | Admit: 2014-10-19 | Discharge: 2014-10-19 | Discharge status: Against Medical Advice | Payer: Medicare Other | Attending: Emergency Medicine | Admitting: Emergency Medicine

## 2014-10-19 DIAGNOSIS — Z79899 Other long term (current) drug therapy: Secondary | ICD-10-CM | POA: Insufficient documentation

## 2014-10-19 DIAGNOSIS — Z853 Personal history of malignant neoplasm of breast: Secondary | ICD-10-CM | POA: Insufficient documentation

## 2014-10-19 DIAGNOSIS — Z7951 Long term (current) use of inhaled steroids: Secondary | ICD-10-CM | POA: Insufficient documentation

## 2014-10-19 DIAGNOSIS — R05 Cough: Secondary | ICD-10-CM

## 2014-10-19 DIAGNOSIS — Z8742 Personal history of other diseases of the female genital tract: Secondary | ICD-10-CM | POA: Insufficient documentation

## 2014-10-19 DIAGNOSIS — R9431 Abnormal electrocardiogram [ECG] [EKG]: Secondary | ICD-10-CM | POA: Diagnosis not present

## 2014-10-19 DIAGNOSIS — J069 Acute upper respiratory infection, unspecified: Secondary | ICD-10-CM | POA: Diagnosis not present

## 2014-10-19 DIAGNOSIS — I10 Essential (primary) hypertension: Secondary | ICD-10-CM | POA: Diagnosis not present

## 2014-10-19 DIAGNOSIS — J029 Acute pharyngitis, unspecified: Secondary | ICD-10-CM | POA: Diagnosis not present

## 2014-10-19 DIAGNOSIS — R0602 Shortness of breath: Secondary | ICD-10-CM | POA: Diagnosis present

## 2014-10-19 DIAGNOSIS — Z7982 Long term (current) use of aspirin: Secondary | ICD-10-CM | POA: Insufficient documentation

## 2014-10-19 DIAGNOSIS — R079 Chest pain, unspecified: Secondary | ICD-10-CM | POA: Diagnosis not present

## 2014-10-19 DIAGNOSIS — Z87891 Personal history of nicotine dependence: Secondary | ICD-10-CM | POA: Insufficient documentation

## 2014-10-19 DIAGNOSIS — R059 Cough, unspecified: Secondary | ICD-10-CM

## 2014-10-19 NOTE — ED Notes (Signed)
Patient states yesterday she developed a "burning" in her throat. She relates this to inflamed and irritated. Also complains of pressure in her chest. Pain radiates to her back. Denies any shortness of breath and fever. Also, complains of nasal congestion.

## 2014-10-19 NOTE — ED Provider Notes (Signed)
CSN: [id number]     Arrival date & time 10/19/14  2019 History   First MD Initiated Contact with Patient 10/19/14 2051     Chief Complaint  Patient presents with  . Shortness of Breath     (Consider location/radiation/quality/duration/timing/severity/associated sxs/prior Treatment) Patient is a 80 y.o. female presenting with URI.  URI Presenting symptoms: congestion, cough (mild, nonproductive), facial pain (sinus pain) and sore throat   Presenting symptoms: no fever   Severity:  Moderate Onset quality:  Gradual Duration:  3 days Timing:  Constant Progression:  Worsening Chronicity:  New Relieved by:  Nothing Worsened by:  Nothing tried Ineffective treatments:  None tried Associated symptoms: sinus pain     Past Medical History  Diagnosis Date  . Cancer     breast 2002  . Hypertension   . Bladder prolapse, female, acquired    Past Surgical History  Procedure Laterality Date  . Abdominal hysterectomy      2007   No family history on file. History  Substance Use Topics  . Smoking status: Former Research scientist (life sciences)  . Smokeless tobacco: Never Used  . Alcohol Use: Not on file     Comment: every night   OB History    No data available     Review of Systems  Constitutional: Negative for fever.  HENT: Positive for congestion and sore throat.   Respiratory: Positive for cough (mild, nonproductive). Negative for shortness of breath.   Cardiovascular: Negative for chest pain.  All other systems reviewed and are negative.     Allergies  Review of patient's allergies indicates no known allergies.  Home Medications   Prior to Admission medications   Medication Sig Start Date End Date Taking? Authorizing Provider  aspirin 325 MG tablet Take 325 mg by mouth daily.   Yes Historical Provider, MD  olmesartan (BENICAR) 40 MG tablet Take 40 mg by mouth daily.   Yes Historical Provider, MD  fluticasone (FLONASE) 50 MCG/ACT nasal spray 1 spray each nostril twice a day 06/10/13    Josetta Huddle, MD   BP 164/92 mmHg  Pulse 89  Temp(Src) 97.4 F (36.3 C) (Oral)  Resp 20  SpO2 98% Physical Exam  Constitutional: She is oriented to person, place, and time. She appears well-developed and well-nourished. No distress.  HENT:  Head: Normocephalic and atraumatic.  Mouth/Throat: Oropharynx is clear and moist. No oropharyngeal exudate, posterior oropharyngeal edema, posterior oropharyngeal erythema or tonsillar abscesses.  Eyes: Conjunctivae are normal. Pupils are equal, round, and reactive to light. No scleral icterus.  Neck: Neck supple.  Cardiovascular: Normal rate, regular rhythm, normal heart sounds and intact distal pulses.   No murmur heard. Pulmonary/Chest: Effort normal. No stridor. No respiratory distress. She has rales (trace bibasilar).  Abdominal: Soft. Bowel sounds are normal. She exhibits no distension. There is no tenderness. There is no rebound and no guarding.  Musculoskeletal: Normal range of motion.  Neurological: She is alert and oriented to person, place, and time.  Skin: Skin is warm and dry. No rash noted.  Psychiatric: She has a normal mood and affect. Her behavior is normal.  Nursing note and vitals reviewed.   ED Course  Procedures (including critical care time) Labs Review Labs Reviewed - No data to display  Imaging Review Dg Chest 2 View  10/19/2014   CLINICAL DATA:  Sore throat and chest pain  EXAM: CHEST  2 VIEW  COMPARISON:  None.  FINDINGS: The heart size and mediastinal contours are within normal limits. Both lungs  are clear. The visualized skeletal structures are unremarkable.  IMPRESSION: No active cardiopulmonary disease.   Electronically Signed   By: Inez Catalina M.D.   On: 10/19/2014 22:00  All radiology studies independently viewed by me.      EKG Interpretation   Date/Time:  Sunday October 19 2014 21:16:21 EST Ventricular Rate:  73 PR Interval:  183 QRS Duration: 131 QT Interval:  464 QTC Calculation: 511 R Axis:    81 Text Interpretation:  Sinus rhythm Right bundle branch block No  significant change was found Confirmed by [facility]  MD, TREY ([id number]) on  10/19/2014 9:29:55 PM      MDM   Final diagnoses:  Cough  Acute URI    80 yo female with URI symptoms who presented to the ED because she felt that her cold had gone into her chest.  She was very well appearing, nontoxic.  No fevers. CXR negative. I think she has a viral URI. Prior to my re-evaluation, she eloped from the ED with her daughter.      Artis Delay, MD 10/19/14 [phone]

## 2014-10-19 NOTE — ED Notes (Signed)
Was notified by radio that patient was ready to leave. When going to bedside, patient at ambulated out of facility with daughter. While in the other patients room, Amber, CNA notified provider that patient was leaving. He reported she was 2nd next to be seen. Provider did come by room but patient had already left.

## 2014-10-19 NOTE — ED Notes (Signed)
Provided warm blanket.

## 2014-10-19 NOTE — ED Notes (Signed)
Pt in radiology 

## 2014-10-19 NOTE — ED Notes (Signed)
Pt states that she has had cough and congestion for a couple of days; pt states that she developed a sore throat today and states that it is burning; pt states that she is feeling short of breath and states "My lungs are burning"; pt states that she has a productive cough

## 2014-10-20 ENCOUNTER — Telehealth (HOSPITAL_COMMUNITY): Payer: Self-pay

## 2014-10-20 DIAGNOSIS — J329 Chronic sinusitis, unspecified: Secondary | ICD-10-CM | POA: Diagnosis not present

## 2014-10-23 DIAGNOSIS — R0989 Other specified symptoms and signs involving the circulatory and respiratory systems: Secondary | ICD-10-CM | POA: Diagnosis not present

## 2014-10-23 DIAGNOSIS — J329 Chronic sinusitis, unspecified: Secondary | ICD-10-CM | POA: Diagnosis not present

## 2015-02-02 DIAGNOSIS — J019 Acute sinusitis, unspecified: Secondary | ICD-10-CM | POA: Diagnosis not present

## 2015-04-23 DIAGNOSIS — J019 Acute sinusitis, unspecified: Secondary | ICD-10-CM | POA: Diagnosis not present

## 2015-04-30 ENCOUNTER — Emergency Department (HOSPITAL_COMMUNITY): Payer: Medicare Other

## 2015-04-30 ENCOUNTER — Emergency Department (HOSPITAL_COMMUNITY)
Admission: EM | Admit: 2015-04-30 | Discharge: 2015-04-30 | Disposition: A | Discharge status: Home / Self Care | Payer: Medicare Other | Attending: Emergency Medicine | Admitting: Emergency Medicine

## 2015-04-30 ENCOUNTER — Encounter (HOSPITAL_COMMUNITY): Payer: Self-pay | Admitting: *Deleted

## 2015-04-30 DIAGNOSIS — Z792 Long term (current) use of antibiotics: Secondary | ICD-10-CM | POA: Insufficient documentation

## 2015-04-30 DIAGNOSIS — R51 Headache: Secondary | ICD-10-CM | POA: Diagnosis not present

## 2015-04-30 DIAGNOSIS — Z8709 Personal history of other diseases of the respiratory system: Secondary | ICD-10-CM | POA: Insufficient documentation

## 2015-04-30 DIAGNOSIS — Z7982 Long term (current) use of aspirin: Secondary | ICD-10-CM | POA: Diagnosis not present

## 2015-04-30 DIAGNOSIS — R2689 Other abnormalities of gait and mobility: Secondary | ICD-10-CM | POA: Diagnosis not present

## 2015-04-30 DIAGNOSIS — I10 Essential (primary) hypertension: Secondary | ICD-10-CM | POA: Insufficient documentation

## 2015-04-30 DIAGNOSIS — R42 Dizziness and giddiness: Secondary | ICD-10-CM

## 2015-04-30 DIAGNOSIS — Z79899 Other long term (current) drug therapy: Secondary | ICD-10-CM | POA: Insufficient documentation

## 2015-04-30 DIAGNOSIS — R0981 Nasal congestion: Secondary | ICD-10-CM | POA: Insufficient documentation

## 2015-04-30 DIAGNOSIS — R404 Transient alteration of awareness: Secondary | ICD-10-CM | POA: Diagnosis not present

## 2015-04-30 LAB — CBC
HEMATOCRIT: 38.6 % (ref 36.0–46.0)
HEMOGLOBIN: 13 g/dL (ref 12.0–15.0)
MCH: 30.7 pg (ref 26.0–34.0)
MCHC: 33.7 g/dL (ref 30.0–36.0)
MCV: 91.3 fL (ref 78.0–100.0)
Platelets: 365 10*3/uL (ref 150–400)
RBC: 4.23 MIL/uL (ref 3.87–5.11)
RDW: 14.6 % (ref 11.5–15.5)
WBC: 7.6 10*3/uL (ref 4.0–10.5)

## 2015-04-30 LAB — ETHANOL

## 2015-04-30 LAB — PROTIME-INR
INR: 0.95 (ref 0.00–1.49)
PROTHROMBIN TIME: 12.9 s (ref 11.6–15.2)

## 2015-04-30 LAB — I-STAT TROPONIN, ED: TROPONIN I, POC: 0 ng/mL (ref 0.00–0.08)

## 2015-04-30 LAB — COMPREHENSIVE METABOLIC PANEL
ALT: 17 U/L (ref 14–54)
AST: 21 U/L (ref 15–41)
Albumin: 4.1 g/dL (ref 3.5–5.0)
Alkaline Phosphatase: 66 U/L (ref 38–126)
Anion gap: 10 (ref 5–15)
BUN: 12 mg/dL (ref 6–20)
CO2: 22 mmol/L (ref 22–32)
CREATININE: 0.48 mg/dL (ref 0.44–1.00)
Calcium: 9 mg/dL (ref 8.9–10.3)
Chloride: 101 mmol/L (ref 101–111)
GFR calc Af Amer: >60 mL/min (ref >60)
GFR calc non Af Amer: >60 mL/min (ref >60)
Glucose, Bld: 104 mg/dL — ABNORMAL HIGH (ref 65–99)
POTASSIUM: 4.1 mmol/L (ref 3.5–5.1)
Sodium: 133 mmol/L — ABNORMAL LOW (ref 135–145)
TOTAL PROTEIN: 7.1 g/dL (ref 6.5–8.1)
Total Bilirubin: 0.6 mg/dL (ref 0.3–1.2)

## 2015-04-30 LAB — APTT: aPTT: 32 seconds (ref 24–37)

## 2015-04-30 LAB — DIFFERENTIAL
Basophils Absolute: 0 10*3/uL (ref 0.0–0.1)
Basophils Relative: 1 % (ref 0–1)
Eosinophils Absolute: 0.2 10*3/uL (ref 0.0–0.7)
Eosinophils Relative: 3 % (ref 0–5)
Lymphocytes Relative: 31 % (ref 12–46)
Lymphs Abs: 2.4 10*3/uL (ref 0.7–4.0)
Monocytes Absolute: 0.6 10*3/uL (ref 0.1–1.0)
Monocytes Relative: 8 % (ref 3–12)
Neutro Abs: 4.4 10*3/uL (ref 1.7–7.7)
Neutrophils Relative %: 57 % (ref 43–77)

## 2015-04-30 MED ORDER — FLUTICASONE PROPIONATE 50 MCG/ACT NA SUSP: 1.0000 | Freq: Two times a day (BID) | NASAL | Status: DC

## 2015-04-30 NOTE — ED Notes (Signed)
Pt brought to the ER via EMS for complaints of weakness and dizziness for 2 weeks; pt was at Urgent Care and was sent to the ER for evaluation due to dizziness; pt denies chest pain, shortness of breath or any other symptoms at present; pt is on day 7 of antibiotics for a sinus infection; pt reported to UC that she felt like she was going to pass out earlier

## 2015-04-30 NOTE — ED Provider Notes (Signed)
CSN: [id number]     Arrival date & time 04/30/15  2044 History   First MD Initiated Contact with Patient 04/30/15 2108     Chief Complaint  Patient presents with  . Dizziness   HPI Pt has been dealing with a sinus infection for the past couple of weeks.  She has had nasal congestion and sinus pressure.  She has noticed some dizziness with the sinus congestion.  Certain head movements make it worse.  No ear ache.  No headaches.  No focal weakness.  No speech trouble.  No vision trouble.   She went to an UC today who felt she needed some blood tests so she was sent to the ED Past Medical History  Diagnosis Date  . Hypertension    History reviewed. No pertinent past surgical history. No family history on file. History  Substance Use Topics  . Smoking status: Never Smoker   . Smokeless tobacco: Not on file  . Alcohol Use: No   OB History    No data available     Review of Systems  All other systems reviewed and are negative.     Allergies  Review of patient's allergies indicates no known allergies.  Home Medications   Prior to Admission medications   Medication Sig Start Date End Date Taking? Authorizing Provider  amoxicillin (AMOXIL) 500 MG capsule Take 500 mg by mouth 3 (three) times daily.  04/23/15  Yes Historical Provider, MD  aspirin 81 MG tablet Take 81 mg by mouth daily as needed for pain (heart).    Yes Historical Provider, MD  olmesartan (BENICAR) 40 MG tablet Take 40 mg by mouth daily.   Yes Historical Provider, MD  fluticasone (FLONASE) 50 MCG/ACT nasal spray Place 1 spray into both nostrils 2 (two) times daily. 04/30/15   Dorie Rank, MD   BP 151/70 mmHg  Pulse 68  Temp(Src) 97.5 F (36.4 C)  Resp 20  SpO2 100% Physical Exam  Constitutional: She is oriented to person, place, and time. She appears well-developed and well-nourished. No distress.  HENT:  Head: Normocephalic and atraumatic.  Right Ear: External ear normal.  Left Ear: External ear normal.   Mouth/Throat: Oropharynx is clear and moist.  Eyes: Conjunctivae are normal. Right eye exhibits no discharge. Left eye exhibits no discharge. No scleral icterus.  Neck: Neck supple. No tracheal deviation present.  Cardiovascular: Normal rate, regular rhythm and intact distal pulses.   Pulmonary/Chest: Effort normal and breath sounds normal. No stridor. No respiratory distress. She has no wheezes. She has no rales.  Abdominal: Soft. Bowel sounds are normal. She exhibits no distension. There is no tenderness. There is no rebound and no guarding.  Musculoskeletal: She exhibits no edema or tenderness.  Neurological: She is alert and oriented to person, place, and time. She has normal strength. No cranial nerve deficit (No facial droop, extraocular movements intact, tongue midline ) or sensory deficit. She exhibits normal muscle tone. She displays no seizure activity. Coordination normal.  No pronator drift bilateral upper extrem, able to hold both legs off bed for 5 seconds, sensation intact in all extremities, no visual field cuts, no left or right sided neglect, normal finger-nose exam bilaterally, no nystagmus noted   Skin: Skin is warm and dry. No rash noted.  Psychiatric: She has a normal mood and affect.  Nursing note and vitals reviewed.   ED Course  Procedures (including critical care time) Labs Review Labs Reviewed  COMPREHENSIVE METABOLIC PANEL - Abnormal; Notable for the  following:    Sodium 133 (*)    Glucose, Bld 104 (*)    All other components within normal limits  PROTIME-INR  APTT  CBC  DIFFERENTIAL  ETHANOL  I-STAT TROPOININ, ED    Imaging Review Ct Head Wo Contrast  04/30/2015   CLINICAL DATA:  Weakness and dizziness for 2 weeks.  EXAM: CT HEAD WITHOUT CONTRAST  TECHNIQUE: Contiguous axial images were obtained from the base of the skull through the vertex without intravenous contrast.  COMPARISON:  None.  FINDINGS: Advanced atrophy with sulcal prominence centralized  volume loss with commensurate ex vacuo dilatation of the ventricular system. Scattered periventricular hypodensities compatible with microvascular ischemic disease. There is a punctate (approximately 3 mm) focus of fat about the midline frontal falx (image 16, series 2). No CT evidence of acute large territory infarct. No intraparenchymal or extra-axial mass or hemorrhage. Normal configuration of the ventricles and basilar cisterns. No midline shift. Intracranial atherosclerosis. Limited visualization of the paranasal sinuses and mastoid air cells is normal. No air-fluid levels. Regional soft tissues appear normal. No displaced calvarial fracture.  IMPRESSION: Advanced atrophy and microvascular ischemic disease without acute intracranial process.   Electronically Signed   By: Sandi Mariscal M.D.   On: 04/30/2015 22:55     EKG Interpretation   Date/Time:  Thursday Apr 30 2015 21:53:14 EDT Ventricular Rate:  59 PR Interval:  179 QRS Duration: 144 QT Interval:  467 QTC Calculation: 463 R Axis:   76 Text Interpretation:  Sinus rhythm Right bundle branch block No old  tracing to compare Confirmed by Kenli Waldo  MD-J, Rama Sorci ([id number]) on 04/30/2015  10:02:15 PM      MDM   Final diagnoses:  Loss of equilibrium  Sinus congestion    Patient's neurologic exam is completely normal. I doubt stroke or TIA.  Laboratory tests and EKG unremarkable.  I suspect her symptoms could be related to her sinus inflammation and possibly mild vertigo. Family requests a nasal steroid spray.  Warning signs and  Precautions discussed    Dorie Rank, MD 04/30/15 [phone]

## 2015-04-30 NOTE — ED Notes (Signed)
Returned from CT.

## 2015-04-30 NOTE — Discharge Instructions (Signed)
Dizziness   Dizziness means you feel unsteady or lightheaded. You might feel like you are going to pass out (faint).  HOME CARE   · Drink enough fluids to keep your pee (urine) clear or pale yellow.  · Take your medicines exactly as told by your doctor. If you take blood pressure medicine, always stand up slowly from the lying or sitting position. Hold on to something to steady yourself.  · If you need to stand in one place for a long time, move your legs often. Tighten and relax your leg muscles.  · Have someone stay with you until you feel okay.  · Do not drive or use heavy machinery if you feel dizzy.  · Do not drink alcohol.  GET HELP RIGHT AWAY IF:   · You feel dizzy or lightheaded and it gets worse.  · You feel sick to your stomach (nauseous), or you throw up (vomit).  · You have trouble talking or walking.  · You feel weak or have trouble using your arms, hands, or legs.  · You cannot think clearly or have trouble forming sentences.  · You have chest pain, belly (abdominal) pain, sweating, or you are short of breath.  · Your vision changes.  · You are bleeding.  · You have problems from your medicine that seem to be getting worse.  MAKE SURE YOU:   · Understand these instructions.  · Will watch your condition.  · Will get help right away if you are not doing well or get worse.  Document Released: 11/10/2011 Document Revised: 02/13/2012 Document Reviewed: 11/10/2011  [employer]® Patient Information ©2015 [employer]. This information is not intended to replace advice given to you by your health care provider. Make sure you discuss any questions you have with your health care provider.

## 2015-04-30 NOTE — ED Notes (Signed)
EKG and labwork sent with pt from urgent care  On bedside table in room for physician to review

## 2015-05-01 ENCOUNTER — Encounter (HOSPITAL_COMMUNITY): Payer: Self-pay | Admitting: *Deleted

## 2015-05-06 DIAGNOSIS — D692 Other nonthrombocytopenic purpura: Secondary | ICD-10-CM | POA: Diagnosis not present

## 2015-05-06 DIAGNOSIS — B078 Other viral warts: Secondary | ICD-10-CM | POA: Diagnosis not present

## 2015-05-06 DIAGNOSIS — L309 Dermatitis, unspecified: Secondary | ICD-10-CM | POA: Diagnosis not present

## 2015-05-06 DIAGNOSIS — D485 Neoplasm of uncertain behavior of skin: Secondary | ICD-10-CM | POA: Diagnosis not present

## 2015-05-06 DIAGNOSIS — L821 Other seborrheic keratosis: Secondary | ICD-10-CM | POA: Diagnosis not present

## 2015-08-12 ENCOUNTER — Emergency Department (HOSPITAL_COMMUNITY)
Admission: EM | Admit: 2015-08-12 | Discharge: 2015-08-12 | Disposition: A | Discharge status: Home / Self Care | Payer: Medicare Other | Attending: Emergency Medicine | Admitting: Emergency Medicine

## 2015-08-12 ENCOUNTER — Encounter (HOSPITAL_COMMUNITY): Payer: Self-pay | Admitting: *Deleted

## 2015-08-12 ENCOUNTER — Emergency Department (HOSPITAL_COMMUNITY): Payer: Medicare Other

## 2015-08-12 DIAGNOSIS — I10 Essential (primary) hypertension: Secondary | ICD-10-CM | POA: Diagnosis not present

## 2015-08-12 DIAGNOSIS — Z853 Personal history of malignant neoplasm of breast: Secondary | ICD-10-CM | POA: Diagnosis not present

## 2015-08-12 DIAGNOSIS — R531 Weakness: Secondary | ICD-10-CM | POA: Diagnosis not present

## 2015-08-12 DIAGNOSIS — R42 Dizziness and giddiness: Secondary | ICD-10-CM | POA: Diagnosis not present

## 2015-08-12 DIAGNOSIS — Z79899 Other long term (current) drug therapy: Secondary | ICD-10-CM | POA: Insufficient documentation

## 2015-08-12 DIAGNOSIS — H53149 Visual discomfort, unspecified: Secondary | ICD-10-CM | POA: Insufficient documentation

## 2015-08-12 DIAGNOSIS — E871 Hypo-osmolality and hyponatremia: Secondary | ICD-10-CM | POA: Insufficient documentation

## 2015-08-12 DIAGNOSIS — R5383 Other fatigue: Secondary | ICD-10-CM | POA: Insufficient documentation

## 2015-08-12 DIAGNOSIS — Z87448 Personal history of other diseases of urinary system: Secondary | ICD-10-CM | POA: Insufficient documentation

## 2015-08-12 DIAGNOSIS — J3489 Other specified disorders of nose and nasal sinuses: Secondary | ICD-10-CM | POA: Diagnosis not present

## 2015-08-12 DIAGNOSIS — R0982 Postnasal drip: Secondary | ICD-10-CM | POA: Insufficient documentation

## 2015-08-12 DIAGNOSIS — Z7982 Long term (current) use of aspirin: Secondary | ICD-10-CM | POA: Diagnosis not present

## 2015-08-12 DIAGNOSIS — R110 Nausea: Secondary | ICD-10-CM | POA: Diagnosis not present

## 2015-08-12 LAB — URINALYSIS, ROUTINE W REFLEX MICROSCOPIC
BILIRUBIN URINE: NEGATIVE
GLUCOSE, UA: NEGATIVE mg/dL
KETONES UR: 40 mg/dL — AB
LEUKOCYTES UA: NEGATIVE
Nitrite: NEGATIVE
PH: 8 (ref 5.0–8.0)
Protein, ur: NEGATIVE mg/dL
Specific Gravity, Urine: 1.014 (ref 1.005–1.030)
Urobilinogen, UA: 0.2 mg/dL (ref 0.0–1.0)

## 2015-08-12 LAB — COMPREHENSIVE METABOLIC PANEL
ALK PHOS: 65 U/L (ref 38–126)
ALT: 13 U/L — AB (ref 14–54)
AST: 21 U/L (ref 15–41)
Albumin: 4.1 g/dL (ref 3.5–5.0)
Anion gap: 11 (ref 5–15)
BUN: 12 mg/dL (ref 6–20)
CHLORIDE: 98 mmol/L — AB (ref 101–111)
CO2: 20 mmol/L — AB (ref 22–32)
CREATININE: 0.52 mg/dL (ref 0.44–1.00)
Calcium: 8.8 mg/dL — ABNORMAL LOW (ref 8.9–10.3)
GFR calc Af Amer: >60 mL/min (ref >60)
GFR calc non Af Amer: >60 mL/min (ref >60)
GLUCOSE: 116 mg/dL — AB (ref 65–99)
Potassium: 3.4 mmol/L — ABNORMAL LOW (ref 3.5–5.1)
SODIUM: 129 mmol/L — AB (ref 135–145)
Total Bilirubin: 1.1 mg/dL (ref 0.3–1.2)
Total Protein: 6.7 g/dL (ref 6.5–8.1)

## 2015-08-12 LAB — CBC
HCT: 35.5 % — ABNORMAL LOW (ref 36.0–46.0)
Hemoglobin: 12 g/dL (ref 12.0–15.0)
MCH: 30.4 pg (ref 26.0–34.0)
MCHC: 33.8 g/dL (ref 30.0–36.0)
MCV: 89.9 fL (ref 78.0–100.0)
PLATELETS: 334 10*3/uL (ref 150–400)
RBC: 3.95 MIL/uL (ref 3.87–5.11)
RDW: 14.3 % (ref 11.5–15.5)
WBC: 9.2 10*3/uL (ref 4.0–10.5)

## 2015-08-12 LAB — LIPASE, BLOOD: LIPASE: 23 U/L (ref 22–51)

## 2015-08-12 LAB — URINE MICROSCOPIC-ADD ON

## 2015-08-12 MED ORDER — LORATADINE 10 MG PO TABS: 10.0000 mg | ORAL_TABLET | Freq: Every day | ORAL | Status: DC

## 2015-08-12 MED ORDER — MECLIZINE HCL 25 MG PO TABS: 12.5000 mg | ORAL_TABLET | Freq: Once | ORAL | Status: DC

## 2015-08-12 MED ORDER — SODIUM CHLORIDE 0.9 % IV BOLUS (SEPSIS)
1000.0000 mL | Freq: Once | INTRAVENOUS | Status: AC
  Administered 2015-08-12: 1000 mL via INTRAVENOUS

## 2015-08-12 MED ORDER — ONDANSETRON HCL 4 MG/2ML IJ SOLN: 4.0000 mg | INTRAMUSCULAR | Status: DC

## 2015-08-12 MED ORDER — FLUTICASONE PROPIONATE 50 MCG/ACT NA SUSP: 1.0000 | Freq: Two times a day (BID) | NASAL | Status: DC | PRN

## 2015-08-12 MED ORDER — MECLIZINE HCL 12.5 MG PO TABS: 12.5000 mg | ORAL_TABLET | Freq: Three times a day (TID) | ORAL | Status: DC | PRN

## 2015-08-12 NOTE — ED Notes (Addendum)
Pt states that she is "very weak and exhausted".  This began about 4 days ago.  RN is unable to get patient to focus on answering question being asked.  Daughters are at bedside and are helpful.  Pt lives at home by herself and has someone staying with her at night.  She denies fever, no nausea.  She is congested but denies cough.  The daughter states that patient has an "episode" like this about three months ago.  They also state pt had a "neurological event" about two years ago.  (pt gets very upset when daughters talk about pt having a possible stroke).  Pt is very non-specific regarding what has brought her to the hospital today.  Per family, patient is eating and drinking well.    One of the daughters has been sick for about a week and believes this to be a virus.

## 2015-08-12 NOTE — ED Provider Notes (Signed)
CSN: [id number]     Arrival date & time 08/12/15  1325 History   First MD Initiated Contact with Patient 08/12/15 [phone]     Chief Complaint  Patient presents with  . Weakness     (Consider location/radiation/quality/duration/timing/severity/associated sxs/prior Treatment) HPI   Pt is an 81 year-old female with pmhx of HTN, bladder prolaspes and breast CA in remission, who presents to the ER with one day of sinus congestion sx with ear fullness, vertigo sx with turning her head, sensitivity to sound, photophobia, and generalized weakness.  She was in her normal state of good health yesterday. It is her daughter has been sick with "virus", she believes her mother may have, she had. Patient has been seen before for vertigo and sinus infection, she states that it does not feel exactly the same as her previous episodes of dizziness. She states she is having nasal drainage in the back or throat. She denies headache, sinus pain/pressure, vision changes, confusion, difficulty speaking, sore throat, chest congestion, cough, shortness of breath, chest pain, abdominal pain, vomiting or diarrhea.  During her physical exam because she became nauseous as she was sitting up. She denies any fever, sweats, chills. She denies any urinary symptoms, has not noticed any hematuria, hematochezia, melena.  She denies any numbness, tingling, unilateral weakness, syncope. Her daughters who have been with her over the past 2 days tonight any episodes of confusion, facial droop, slurred speech, aphasia or dysphasia.  Past Medical History  Diagnosis Date  . Cancer     breast 2002  . Bladder prolapse, female, acquired   . Hypertension    Past Surgical History  Procedure Laterality Date  . Abdominal hysterectomy      2007   History reviewed. No pertinent family history. Social History  Substance Use Topics  . Smoking status: Never Smoker   . Smokeless tobacco: None  . Alcohol Use: No     Comment: every night   OB  History    Gravida Para Term Preterm AB TAB SAB Ectopic Multiple Living   0 0 0 0 0 0 0 0       Review of Systems  Constitutional: Positive for activity change, fatigue and unexpected weight change. Negative for fever, chills, diaphoresis and appetite change.  HENT: Positive for postnasal drip. Negative for ear pain, facial swelling, hearing loss, sinus pressure, sore throat, tinnitus and trouble swallowing.   Eyes: Positive for photophobia. Negative for pain, discharge, redness and visual disturbance.  Respiratory: Negative for cough, chest tightness, shortness of breath, wheezing and stridor.   Cardiovascular: Negative.  Negative for chest pain, palpitations and leg swelling.  Gastrointestinal: Positive for nausea. Negative for vomiting, abdominal pain, diarrhea, constipation and abdominal distention.  Endocrine: Negative.   Genitourinary: Negative.   Musculoskeletal: Negative.   Skin: Negative.   Neurological: Positive for dizziness and weakness. Negative for syncope, facial asymmetry, speech difficulty, numbness and headaches.  Psychiatric/Behavioral: Negative.     Allergies  Review of patient's allergies indicates no known allergies.  Home Medications   Prior to Admission medications   Medication Sig Start Date End Date Taking? Authorizing Provider  aspirin 325 MG tablet Take 325 mg by mouth daily.   Yes Historical Provider, MD  aspirin 81 MG tablet Take 81 mg by mouth daily as needed for pain (heart).    Yes Historical Provider, MD  olmesartan (BENICAR) 40 MG tablet Take 40 mg by mouth daily.   Yes Historical Provider, MD  fluticasone (FLONASE) 50 MCG/ACT nasal  spray Place 1 spray into both nostrils 2 (two) times daily as needed for allergies. 08/12/15   Delsa Grana, PA-C  loratadine (CLARITIN) 10 MG tablet Take 1 tablet (10 mg total) by mouth daily. 08/12/15   Delsa Grana, PA-C  meclizine (ANTIVERT) 12.5 MG tablet Take 1 tablet (12.5 mg total) by mouth 3 (three) times daily as  needed for dizziness. 08/12/15   Delsa Grana, PA-C   BP 169/88 mmHg  Pulse 72  Temp(Src) 98.2 F (36.8 C) (Oral)  Resp 16  SpO2 98% Physical Exam  Constitutional: She is oriented to person, place, and time. She appears well-developed and well-nourished.  Non-toxic appearance. No distress.  HENT:  Head: Normocephalic and atraumatic.  Right Ear: Hearing, tympanic membrane, external ear and ear canal normal. Tympanic membrane is not injected and not retracted.  Left Ear: Hearing, tympanic membrane, external ear and ear canal normal. Tympanic membrane is not injected and not retracted.  Nose: Nose normal.  Mouth/Throat: Uvula is midline. Mucous membranes are not pale, dry and not cyanotic. No trismus in the jaw. No uvula swelling. Posterior oropharyngeal erythema present. No oropharyngeal exudate, posterior oropharyngeal edema or tonsillar abscesses.  Tympanic membrane bilaterally appears normal, normal cone of light, translucent pearly gray, all landmarks visible, no effusion  Eyes: Conjunctivae and lids are normal. Pupils are equal, round, and reactive to light. Right eye exhibits no discharge. Left eye exhibits no discharge. Right conjunctiva is not injected. Left conjunctiva is not injected. No scleral icterus. Right eye exhibits nystagmus. Right eye exhibits normal extraocular motion. Left eye exhibits normal extraocular motion and no nystagmus.  Neck: Normal range of motion. No JVD present. No tracheal deviation present. No thyromegaly present.  Cardiovascular: Normal rate, regular rhythm, normal heart sounds and intact distal pulses.  Exam reveals no gallop and no friction rub.   No murmur heard. Pulses:      Radial pulses are 2+ on the right side, and 2+ on the left side.       Posterior tibial pulses are 2+ on the right side, and 2+ on the left side.  Pulmonary/Chest: No accessory muscle usage. No respiratory distress. She has decreased breath sounds. She has no wheezes. She has no  rhonchi. She has no rales. She exhibits no tenderness.  Decreased breath sound throughout, no rhonchi, crackles or wheeze, no tenderness palpation of chest wall, no respiratory distress and no accessory muscle use  Abdominal: Soft. Bowel sounds are normal. She exhibits no distension and no mass. There is no tenderness. There is no rebound and no guarding.  Musculoskeletal: Normal range of motion. She exhibits no edema or tenderness.  Lymphadenopathy:       Head (right side): Submandibular and tonsillar adenopathy present.       Head (left side): Submandibular and tonsillar adenopathy present.    She has cervical adenopathy.       Left cervical: Superficial cervical adenopathy present.  Neurological: She is alert and oriented to person, place, and time. She has normal reflexes. No cranial nerve deficit or sensory deficit. She exhibits normal muscle tone. Coordination normal.  Speech is clear and goal oriented, follows commands Major Cranial nerves without deficit, no facial droop Normal strength in upper and lower extremities bilaterally including dorsiflexion and plantar flexion, strong and equal grip strength Sensation normal to light and sharp touch Moves extremities without ataxia, coordination intact Normal finger to nose and rapid alternating movements Neg romberg, no pronator drift  Skin: Skin is warm, dry and intact. No  rash noted. She is not diaphoretic. No cyanosis or erythema. No pallor. Nails show no clubbing.  Psychiatric: She has a normal mood and affect. Her behavior is normal. Judgment and thought content normal.  Nursing note and vitals reviewed.   ED Course  Procedures (including critical care time) Labs Review Labs Reviewed  COMPREHENSIVE METABOLIC PANEL - Abnormal; Notable for the following:    Sodium 129 (*)    Potassium 3.4 (*)    Chloride 98 (*)    CO2 20 (*)    Glucose, Bld 116 (*)    Calcium 8.8 (*)    ALT 13 (*)    All other components within normal limits   CBC - Abnormal; Notable for the following:    HCT 35.5 (*)    All other components within normal limits  URINALYSIS, ROUTINE W REFLEX MICROSCOPIC (NOT AT [facility]) - Abnormal; Notable for the following:    APPearance CLOUDY (*)    Hgb urine dipstick TRACE (*)    Ketones, ur 40 (*)    All other components within normal limits  URINE MICROSCOPIC-ADD ON - Abnormal; Notable for the following:    Squamous Epithelial / LPF FEW (*)    Bacteria, UA FEW (*)    All other components within normal limits  LIPASE, BLOOD    Imaging Review Dg Chest 2 View  08/12/2015   CLINICAL DATA:  Patient complains weakness and exhaustion.  EXAM: CHEST  2 VIEW  COMPARISON:  October 19, 2014  FINDINGS: The heart size and mediastinal contours are within normal limits. There is no focal infiltrate, pulmonary edema, or pleural effusion. The visualized skeletal structures are unremarkable.  IMPRESSION: No active cardiopulmonary disease.   Electronically Signed   By: Abelardo Diesel M.D.   On: 08/12/2015 17:51   I have personally reviewed and evaluated these images and lab results as part of my medical decision-making.   EKG Interpretation   Date/Time:  Wednesday August 12 2015 13:28:42 EDT Ventricular Rate:  81 PR Interval:  169 QRS Duration: 143 QT Interval:  425 QTC Calculation: 493 R Axis:   81 Text Interpretation:  Sinus rhythm Right bundle branch block No  significant change since last tracing Confirmed by LIU MD, DANA [phone]) on  08/12/2015 4:13:44 PM      MDM   Final diagnoses:  Weakness generalized  Hyponatremia    Patient with weakness, sx most consistent with vertigo, reproducible with positional changes, horizontal nystagmus to the right eye with EOMs, patient is alert and oriented 3, mildly hypertensive, complaining of sinus infection  Dr. Oleta Mouse has seen and evaluated the pt.  Pt has asked her for amoxicillin and states she has had sinus sx for 4 days. She will be discharged home with  supportive treatment, no abx.  I will refer her to ENT for f/up of vertigo/sinusitis  Chart review reveals multiple visits for similar symptoms with multiple MRIs, CT scans were head, and evaluation by neurology.  They seem to be no acute findings here, and no indication for reimaging.   She was offered Zofran for her nausea which she refused. She was also offered meclizine low-dose trial while here in the emergency room for her positional vertigo, which she also refused.  She is hypertensive in the ER, but is asymptomatic she does deny shortness breath, headache, chest pain, lower extremity edema.  Her family members states she has had similar blood pressure readings for several years, and will only take Benicar, and is never had good  control.  She had mild hyponatremia here in the ER, was treated with a liter saline.  She was not orthostatic.  Filed Vitals:   08/12/15 1339 08/12/15 1344 08/12/15 1550 08/12/15 1822  BP:   170/64 169/88  Pulse: 75  71 72  Temp:  98.2 F (36.8 C)    TempSrc:  Oral    Resp: 22  13 16   SpO2: 100%  96% 98%   She was discharged with her daughters.  Was given Rx for allergic rhinitis - this appears to be a chronic problem.  She has been instructed to try OTC antivert, she refused all meds and interventions here, except for IVF.  She was given referrals to ENT and both neuro groups to follow up on vertigo and rhinitis/sinusitis issues which seem chronic.     Delsa Grana, PA-C 08/12/15 Wheatley Liu, MD 08/13/15 Bouton Liu, MD 08/13/15 [phone]

## 2015-08-12 NOTE — ED Notes (Addendum)
Pt went to urgent care this morning, then came to ED. At urgent care BP was higher, pt took benicar. Pt denies pain, denies nausea. Keeps saying " I am dying, I am going to pass out, I am passing out". Family reports pt is alert and oriented x4 and at baseline. Pt reports nasal congestion x1 week. Weakness x2 days.  Denies cough. Last bowel movement this morning. Denies dysuria.   Pt stating "just put me in a bed and knock me out, let me die. Woman why aren't you putting me in a bed?!?" pt started cursing at rn and at family,. rn informed pt that she was not allowed to curse at staff. Pt said "I can do what I want. " rn explained if she wanted to be treated her she was not allowed to curse at staff, that she could curse at her family but not staff. Pt told family "she hopes they get what she has".   Dr Ronnald Ramp called and spoke to another nurse earlier today and said pt came to see him due to n/v and abd pain.

## 2015-08-12 NOTE — Discharge Instructions (Signed)
Hyponatremia  Hyponatremia is when the amount of salt (sodium) in your blood is too low. When sodium levels are low, your cells will absorb extra water and swell. The swelling happens throughout the body, but it mostly affects the brain. Severe brain swelling (cerebral edema), seizures, or coma can happen.  CAUSES   Heart, kidney, or liver problems.  Thyroid problems.  Adrenal gland problems.  Severe vomiting and diarrhea.  Certain medicines or illegal drugs.  Dehydration.  Drinking too much water.  Low-sodium diet. SYMPTOMS   Nausea and vomiting.  Confusion.  Lethargy.  Agitation.  Headache.  Twitching or shaking (seizures).  Unconsciousness.  Appetite loss.  Muscle weakness and cramping. DIAGNOSIS  Hyponatremia is identified by a simple blood test. Your caregiver will perform a history and physical exam to try to find the cause and type of hyponatremia. Other tests may be needed to measure the amount of sodium in your blood and urine. TREATMENT  Treatment will depend on the cause.   Fluids may be given through the vein (IV).  Medicines may be used to correct the sodium imbalance. If medicines are causing the problem, they will need to be adjusted.  Water or fluid intake may be restricted to restore proper balance. The speed of correcting the sodium problem is very important. If the problem is corrected too fast, nerve damage (sometimes unchangeable) can happen. HOME CARE INSTRUCTIONS   Only take medicines as directed by your caregiver. Many medicines can make hyponatremia worse. Discuss all your medicines with your caregiver.  Carefully follow any recommended diet, including any fluid restrictions.  You may be asked to repeat lab tests. Follow these directions.  Avoid alcohol and recreational drugs. SEEK MEDICAL CARE IF:   You develop worsening nausea, fatigue, headache, confusion, or weakness.  Your original hyponatremia symptoms return.  You have  problems following the recommended diet. SEEK IMMEDIATE MEDICAL CARE IF:   You have a seizure.  You faint.  You have ongoing diarrhea or vomiting. MAKE SURE YOU:   Understand these instructions.  Will watch your condition.  Will get help right away if you are not doing well or get worse. Document Released: 11/11/2002 Document Revised: 02/13/2012 Document Reviewed: 05/08/2011 [facility] Patient Information 2015 [place], [place]. This information is not intended to replace advice given to you by your health care provider. Make sure you discuss any questions you have with your health care provider.  Weakness Weakness is a lack of strength. You may feel weak all over your body or just in one part of your body. Weakness can be serious. In some cases, you may need more medical tests. HOME [place] a well-balanced diet.  Try to exercise every day.  Only take medicines as told by your doctor. GET HELP RIGHT AWAY IF:   You cannot do your normal daily activities.  You cannot walk up and down stairs, or you feel very tired when you do so.  You have shortness of breath or chest pain.  You have trouble moving parts of your body.  You have weakness in only one body part or on only one side of the body.  You have a fever.  You have trouble speaking or swallowing.  You cannot control when you pee (urinate) or poop (bowel movement).  You have black or bloody throw up (vomit) or poop.  Your weakness gets worse or spreads to other body parts.  You have new aches or pains. MAKE SURE YOU:   Understand  these instructions.  Will watch your condition.  Will get help right away if you are not doing well or get worse. Document Released: 11/03/2008 Document Revised: 05/22/2012 Document Reviewed: 01/20/2012 [facility] Patient Information 2015 [place], [place]. This information is not intended to replace advice given to you by your health care provider. Make sure you discuss any  questions you have with your health care provider.

## 2015-08-14 DIAGNOSIS — J329 Chronic sinusitis, unspecified: Secondary | ICD-10-CM | POA: Diagnosis not present

## 2015-08-14 LAB — URINE CULTURE: Special Requests: NORMAL

## 2016-01-14 DIAGNOSIS — H1131 Conjunctival hemorrhage, right eye: Secondary | ICD-10-CM | POA: Diagnosis not present

## 2016-03-08 DIAGNOSIS — Z0000 Encounter for general adult medical examination without abnormal findings: Secondary | ICD-10-CM | POA: Diagnosis not present

## 2016-03-08 DIAGNOSIS — Z7189 Other specified counseling: Secondary | ICD-10-CM | POA: Diagnosis not present

## 2016-03-08 DIAGNOSIS — Z1389 Encounter for screening for other disorder: Secondary | ICD-10-CM | POA: Diagnosis not present

## 2016-04-12 DIAGNOSIS — I10 Essential (primary) hypertension: Secondary | ICD-10-CM | POA: Diagnosis not present

## 2016-04-12 DIAGNOSIS — R410 Disorientation, unspecified: Secondary | ICD-10-CM | POA: Diagnosis not present

## 2016-04-12 DIAGNOSIS — R238 Other skin changes: Secondary | ICD-10-CM | POA: Diagnosis not present

## 2016-04-12 DIAGNOSIS — J309 Allergic rhinitis, unspecified: Secondary | ICD-10-CM | POA: Diagnosis not present

## 2016-05-17 DIAGNOSIS — L309 Dermatitis, unspecified: Secondary | ICD-10-CM | POA: Diagnosis not present

## 2016-05-17 DIAGNOSIS — L719 Rosacea, unspecified: Secondary | ICD-10-CM | POA: Diagnosis not present

## 2016-07-18 DIAGNOSIS — L719 Rosacea, unspecified: Secondary | ICD-10-CM | POA: Diagnosis not present

## 2016-07-18 DIAGNOSIS — R410 Disorientation, unspecified: Secondary | ICD-10-CM | POA: Diagnosis not present

## 2016-07-18 DIAGNOSIS — J309 Allergic rhinitis, unspecified: Secondary | ICD-10-CM | POA: Diagnosis not present

## 2016-07-18 DIAGNOSIS — I10 Essential (primary) hypertension: Secondary | ICD-10-CM | POA: Diagnosis not present

## 2016-07-18 DIAGNOSIS — R238 Other skin changes: Secondary | ICD-10-CM | POA: Diagnosis not present

## 2017-01-06 DIAGNOSIS — J0100 Acute maxillary sinusitis, unspecified: Secondary | ICD-10-CM | POA: Diagnosis not present

## 2018-07-10 DIAGNOSIS — R413 Other amnesia: Secondary | ICD-10-CM | POA: Diagnosis not present

## 2018-07-10 DIAGNOSIS — I10 Essential (primary) hypertension: Secondary | ICD-10-CM | POA: Diagnosis not present

## 2018-08-20 DIAGNOSIS — R413 Other amnesia: Secondary | ICD-10-CM | POA: Diagnosis not present

## 2018-08-20 DIAGNOSIS — I10 Essential (primary) hypertension: Secondary | ICD-10-CM | POA: Diagnosis not present

## 2019-04-14 DIAGNOSIS — K6289 Other specified diseases of anus and rectum: Secondary | ICD-10-CM | POA: Diagnosis not present

## 2019-05-16 DIAGNOSIS — K6289 Other specified diseases of anus and rectum: Secondary | ICD-10-CM | POA: Diagnosis not present

## 2019-06-12 DIAGNOSIS — M21621 Bunionette of right foot: Secondary | ICD-10-CM | POA: Diagnosis not present

## 2019-06-12 DIAGNOSIS — M71571 Other bursitis, not elsewhere classified, right ankle and foot: Secondary | ICD-10-CM | POA: Diagnosis not present

## 2019-06-12 DIAGNOSIS — M79674 Pain in right toe(s): Secondary | ICD-10-CM | POA: Diagnosis not present

## 2019-06-12 DIAGNOSIS — B351 Tinea unguium: Secondary | ICD-10-CM | POA: Diagnosis not present

## 2019-07-08 DIAGNOSIS — K6289 Other specified diseases of anus and rectum: Secondary | ICD-10-CM | POA: Diagnosis not present

## 2019-07-10 DIAGNOSIS — K6289 Other specified diseases of anus and rectum: Secondary | ICD-10-CM | POA: Diagnosis not present

## 2019-07-10 DIAGNOSIS — I10 Essential (primary) hypertension: Secondary | ICD-10-CM | POA: Diagnosis not present

## 2019-07-10 DIAGNOSIS — R413 Other amnesia: Secondary | ICD-10-CM | POA: Diagnosis not present

## 2019-07-12 DIAGNOSIS — K625 Hemorrhage of anus and rectum: Secondary | ICD-10-CM | POA: Diagnosis not present

## 2019-07-12 DIAGNOSIS — R198 Other specified symptoms and signs involving the digestive system and abdomen: Secondary | ICD-10-CM | POA: Diagnosis not present

## 2019-07-12 DIAGNOSIS — R413 Other amnesia: Secondary | ICD-10-CM | POA: Diagnosis not present

## 2019-07-16 ENCOUNTER — Other Ambulatory Visit: Payer: Self-pay | Admitting: Internal Medicine

## 2019-07-16 DIAGNOSIS — K6289 Other specified diseases of anus and rectum: Secondary | ICD-10-CM

## 2019-07-17 ENCOUNTER — Ambulatory Visit: Payer: Self-pay | Admitting: General Surgery

## 2019-07-18 ENCOUNTER — Ambulatory Visit
Admission: RE | Admit: 2019-07-18 | Discharge: 2019-07-18 | Disposition: A | Discharge status: Home / Self Care | Payer: Medicare Other | Source: Ambulatory Visit | Attending: Internal Medicine | Admitting: Internal Medicine

## 2019-07-18 DIAGNOSIS — K5730 Diverticulosis of large intestine without perforation or abscess without bleeding: Secondary | ICD-10-CM | POA: Diagnosis not present

## 2019-07-18 DIAGNOSIS — K6289 Other specified diseases of anus and rectum: Secondary | ICD-10-CM

## 2019-07-23 ENCOUNTER — Other Ambulatory Visit: Payer: Federal, State, Local not specified - PPO

## 2019-07-30 ENCOUNTER — Other Ambulatory Visit (HOSPITAL_COMMUNITY)
Admission: RE | Admit: 2019-07-30 | Discharge: 2019-07-30 | Disposition: A | Discharge status: Home / Self Care | Payer: Medicare Other | Source: Ambulatory Visit | Attending: General Surgery | Admitting: General Surgery

## 2019-07-30 DIAGNOSIS — Z01812 Encounter for preprocedural laboratory examination: Secondary | ICD-10-CM | POA: Insufficient documentation

## 2019-07-30 DIAGNOSIS — Z20828 Contact with and (suspected) exposure to other viral communicable diseases: Secondary | ICD-10-CM | POA: Insufficient documentation

## 2019-07-30 LAB — SARS CORONAVIRUS 2 (TAT 6-24 HRS): SARS Coronavirus 2: NEGATIVE

## 2019-07-31 ENCOUNTER — Ambulatory Visit: Payer: Federal, State, Local not specified - PPO | Admitting: Podiatry

## 2019-08-01 ENCOUNTER — Encounter (HOSPITAL_BASED_OUTPATIENT_CLINIC_OR_DEPARTMENT_OTHER): Payer: Self-pay | Admitting: *Deleted

## 2019-08-01 ENCOUNTER — Other Ambulatory Visit: Payer: Self-pay

## 2019-08-01 NOTE — Progress Notes (Signed)
SPOKE WITH POA DAUGHTER [name] HUTTON DUE TO PATIENT HAS DEMENTIA. [name] CELL [phone] WILL SIGN CONSENT AND BRING COPY OF POA. OTHER DAUGHTER ROBBIN HUTTON CELL [phone] WILL BE AVAILABLE AS DRIVER HOME IF POA [name] HALL HAS TO GO TO WORK. ARRIVE 700 AM PER DR Marcello Moores 08-01-2019 [facility]. NPO AFTER MIDNIGHT, NO MEDS TO TAKE. NO LABS NEEDED. HAS SURGERY ORDERS IN Epic.

## 2019-08-02 ENCOUNTER — Ambulatory Visit (HOSPITAL_BASED_OUTPATIENT_CLINIC_OR_DEPARTMENT_OTHER)
Admission: RE | Admit: 2019-08-02 | Discharge: 2019-08-02 | Disposition: A | Discharge status: Home / Self Care | Payer: Medicare Other | Attending: General Surgery | Admitting: General Surgery

## 2019-08-02 ENCOUNTER — Ambulatory Visit (HOSPITAL_BASED_OUTPATIENT_CLINIC_OR_DEPARTMENT_OTHER): Payer: Medicare Other | Admitting: Anesthesiology

## 2019-08-02 ENCOUNTER — Encounter (HOSPITAL_BASED_OUTPATIENT_CLINIC_OR_DEPARTMENT_OTHER)
Admission: RE | Disposition: A | Discharge status: Home / Self Care | Payer: Self-pay | Source: Home / Self Care | Attending: General Surgery

## 2019-08-02 ENCOUNTER — Encounter (HOSPITAL_BASED_OUTPATIENT_CLINIC_OR_DEPARTMENT_OTHER): Payer: Self-pay

## 2019-08-02 DIAGNOSIS — K5909 Other constipation: Secondary | ICD-10-CM | POA: Insufficient documentation

## 2019-08-02 DIAGNOSIS — C211 Malignant neoplasm of anal canal: Secondary | ICD-10-CM | POA: Insufficient documentation

## 2019-08-02 DIAGNOSIS — C44520 Squamous cell carcinoma of anal skin: Secondary | ICD-10-CM | POA: Diagnosis not present

## 2019-08-02 DIAGNOSIS — F0390 Unspecified dementia without behavioral disturbance: Secondary | ICD-10-CM | POA: Diagnosis not present

## 2019-08-02 DIAGNOSIS — Z87891 Personal history of nicotine dependence: Secondary | ICD-10-CM | POA: Diagnosis not present

## 2019-08-02 DIAGNOSIS — J329 Chronic sinusitis, unspecified: Secondary | ICD-10-CM | POA: Diagnosis not present

## 2019-08-02 DIAGNOSIS — R270 Ataxia, unspecified: Secondary | ICD-10-CM | POA: Diagnosis not present

## 2019-08-02 DIAGNOSIS — Z853 Personal history of malignant neoplasm of breast: Secondary | ICD-10-CM | POA: Diagnosis not present

## 2019-08-02 DIAGNOSIS — Z8673 Personal history of transient ischemic attack (TIA), and cerebral infarction without residual deficits: Secondary | ICD-10-CM | POA: Diagnosis not present

## 2019-08-02 DIAGNOSIS — K6289 Other specified diseases of anus and rectum: Secondary | ICD-10-CM | POA: Diagnosis present

## 2019-08-02 DIAGNOSIS — I10 Essential (primary) hypertension: Secondary | ICD-10-CM | POA: Diagnosis not present

## 2019-08-02 HISTORY — DX: Unspecified dementia, unspecified severity, without behavioral disturbance, psychotic disturbance, mood disturbance, and anxiety: F03.90

## 2019-08-02 SURGERY — EXAM UNDER ANESTHESIA
Anesthesia: Monitor Anesthesia Care

## 2019-08-02 MED ORDER — KETOROLAC TROMETHAMINE 30 MG/ML IJ SOLN
INTRAMUSCULAR | Status: DC | PRN
  Administered 2019-08-02: 15 mg via INTRAVENOUS

## 2019-08-02 MED ORDER — FENTANYL CITRATE (PF) 100 MCG/2ML IJ SOLN
INTRAMUSCULAR | Status: DC | PRN
  Administered 2019-08-02: 25 ug via INTRAVENOUS
  Administered 2019-08-02: 12.5 ug via INTRAVENOUS

## 2019-08-02 MED ORDER — FENTANYL CITRATE (PF) 100 MCG/2ML IJ SOLN
25.0000 ug | INTRAMUSCULAR | Status: DC | PRN
  Filled 2019-08-02: qty 1

## 2019-08-02 MED ORDER — MIDAZOLAM HCL 2 MG/2ML IJ SOLN
INTRAMUSCULAR | Status: AC
  Filled 2019-08-02: qty 2

## 2019-08-02 MED ORDER — LACTATED RINGERS IV SOLN
INTRAVENOUS | Status: DC
  Administered 2019-08-02: 08:00:00 via INTRAVENOUS
  Filled 2019-08-02: qty 1000

## 2019-08-02 MED ORDER — PROPOFOL 10 MG/ML IV BOLUS
INTRAVENOUS | Status: DC | PRN
  Administered 2019-08-02: 30 mg via INTRAVENOUS
  Administered 2019-08-02: 20 mg via INTRAVENOUS

## 2019-08-02 MED ORDER — TRAMADOL HCL 50 MG PO TABS: 50.0000 mg | ORAL_TABLET | Freq: Four times a day (QID) | ORAL | 0 refills | Status: DC | PRN

## 2019-08-02 MED ORDER — ONDANSETRON HCL 4 MG/2ML IJ SOLN
INTRAMUSCULAR | Status: DC | PRN
  Administered 2019-08-02: 4 mg via INTRAVENOUS

## 2019-08-02 MED ORDER — SODIUM CHLORIDE 0.9% FLUSH
3.0000 mL | Freq: Two times a day (BID) | INTRAVENOUS | Status: DC
  Filled 2019-08-02: qty 3

## 2019-08-02 MED ORDER — ACETAMINOPHEN 10 MG/ML IV SOLN
INTRAVENOUS | Status: AC
  Filled 2019-08-02: qty 100

## 2019-08-02 MED ORDER — FENTANYL CITRATE (PF) 100 MCG/2ML IJ SOLN
INTRAMUSCULAR | Status: AC
  Filled 2019-08-02: qty 2

## 2019-08-02 MED ORDER — ACETAMINOPHEN 500 MG PO TABS
1000.0000 mg | ORAL_TABLET | ORAL | Status: DC
  Filled 2019-08-02: qty 2

## 2019-08-02 MED ORDER — BUPIVACAINE-EPINEPHRINE 0.5% -1:200000 IJ SOLN
INTRAMUSCULAR | Status: DC | PRN
  Administered 2019-08-02: 20 mL

## 2019-08-02 MED ORDER — SODIUM CHLORIDE 0.9 % IV SOLN
1.0000 g | Freq: Once | INTRAVENOUS | Status: DC
  Filled 2019-08-02: qty 1000

## 2019-08-02 MED ORDER — PROPOFOL 500 MG/50ML IV EMUL
INTRAVENOUS | Status: DC | PRN
  Administered 2019-08-02: 200 ug/kg/min via INTRAVENOUS

## 2019-08-02 MED ORDER — ACETAMINOPHEN 10 MG/ML IV SOLN
1000.0000 mg | Freq: Once | INTRAVENOUS | Status: AC
  Administered 2019-08-02: 750 mg via INTRAVENOUS
  Filled 2019-08-02: qty 100

## 2019-08-02 SURGICAL SUPPLY — 54 items
BENZOIN TINCTURE PRP APPL 2/3 (GAUZE/BANDAGES/DRESSINGS) ×3 IMPLANT
BLADE EXTENDED COATED 6.5IN (ELECTRODE) IMPLANT
BLADE HEX COATED 2.75 (ELECTRODE) ×3 IMPLANT
BLADE SURG 10 STRL SS (BLADE) IMPLANT
BRIEF STRETCH FOR OB PAD LRG (UNDERPADS AND DIAPERS) ×3 IMPLANT
CANISTER SUCT 3000ML PPV (MISCELLANEOUS) ×3 IMPLANT
COVER BACK TABLE 60X90IN (DRAPES) ×3 IMPLANT
COVER MAYO STAND STRL (DRAPES) ×3 IMPLANT
COVER WAND RF STERILE (DRAPES) ×6 IMPLANT
DRAPE HYSTEROSCOPY (DRAPE) IMPLANT
DRAPE LAPAROTOMY 100X72 PEDS (DRAPES) ×3 IMPLANT
DRAPE SHEET LG 3/4 BI-LAMINATE (DRAPES) IMPLANT
DRAPE UTILITY XL STRL (DRAPES) ×3 IMPLANT
DRSG PAD ABDOMINAL 8X10 ST (GAUZE/BANDAGES/DRESSINGS) ×2 IMPLANT
ELECT REM PT RETURN 9FT ADLT (ELECTROSURGICAL) ×3
ELECTRODE REM PT RTRN 9FT ADLT (ELECTROSURGICAL) ×1 IMPLANT
GAUZE SPONGE 4X4 12PLY STRL (GAUZE/BANDAGES/DRESSINGS) ×2 IMPLANT
GLOVE BIO SURGEON STRL SZ 6.5 (GLOVE) ×2 IMPLANT
GLOVE BIO SURGEONS STRL SZ 6.5 (GLOVE) ×1
GLOVE BIOGEL PI IND STRL 7.0 (GLOVE) ×1 IMPLANT
GLOVE BIOGEL PI INDICATOR 7.0 (GLOVE) ×2
GOWN STRL REUS W/TWL XL LVL3 (GOWN DISPOSABLE) ×3 IMPLANT
HYDROGEN PEROXIDE 16OZ (MISCELLANEOUS) IMPLANT
IV CATH 14GX2 1/4 (CATHETERS) IMPLANT
IV CATH 18G SAFETY (IV SOLUTION) IMPLANT
KIT SIGMOIDOSCOPE (SET/KITS/TRAYS/PACK) IMPLANT
KIT TURNOVER CYSTO (KITS) ×3 IMPLANT
LEGGING LITHOTOMY PAIR STRL (DRAPES) IMPLANT
LOOP VESSEL MAXI BLUE (MISCELLANEOUS) IMPLANT
NEEDLE HYPO 22GX1.5 SAFETY (NEEDLE) ×3 IMPLANT
NS IRRIG 500ML POUR BTL (IV SOLUTION) ×3 IMPLANT
PACK BASIN DAY SURGERY FS (CUSTOM PROCEDURE TRAY) ×3 IMPLANT
PAD ABD 8X10 STRL (GAUZE/BANDAGES/DRESSINGS) ×3 IMPLANT
PAD ARMBOARD 7.5X6 YLW CONV (MISCELLANEOUS) ×3 IMPLANT
PAD PREP 24X48 CUFFED NSTRL (MISCELLANEOUS) IMPLANT
PENCIL BUTTON HOLSTER BLD 10FT (ELECTRODE) ×3 IMPLANT
SPONGE HEMORRHOID 8X3CM (HEMOSTASIS) ×2 IMPLANT
SPONGE SURGIFOAM ABS GEL 100 (HEMOSTASIS) IMPLANT
SPONGE SURGIFOAM ABS GEL 12-7 (HEMOSTASIS) IMPLANT
SUT CHROMIC 2 0 SH (SUTURE) IMPLANT
SUT CHROMIC 3 0 SH 27 (SUTURE) IMPLANT
SUT ETHIBOND 0 (SUTURE) IMPLANT
SUT VIC AB 2-0 SH 27 (SUTURE)
SUT VIC AB 2-0 SH 27XBRD (SUTURE) IMPLANT
SUT VIC AB 3-0 SH 27 (SUTURE)
SUT VIC AB 3-0 SH 27XBRD (SUTURE) IMPLANT
SYR 10ML LL (SYRINGE) IMPLANT
SYR CONTROL 10ML LL (SYRINGE) ×3 IMPLANT
TOWEL OR 17X26 10 PK STRL BLUE (TOWEL DISPOSABLE) ×3 IMPLANT
TRAY DSU PREP LF (CUSTOM PROCEDURE TRAY) ×3 IMPLANT
TUBE CONNECTING 12'X1/4 (SUCTIONS) ×1
TUBE CONNECTING 12X1/4 (SUCTIONS) ×2 IMPLANT
UNDERPAD 30X30 (UNDERPADS AND DIAPERS) ×3 IMPLANT
YANKAUER SUCT BULB TIP NO VENT (SUCTIONS) ×3 IMPLANT

## 2019-08-02 NOTE — Anesthesia Preprocedure Evaluation (Addendum)
Anesthesia Evaluation  Patient identified by MRN, date of birth, ID band Patient awake    Reviewed: Allergy & Precautions, NPO status , Patient's Chart, lab work & pertinent test results  History of Anesthesia Complications Negative for: history of anesthetic complications  Airway Mallampati: I  TM Distance: >3 FB Neck ROM: Full    Dental  (+) Upper Dentures, Missing, Dental Advisory Given   Pulmonary former smoker,  07/30/2019 SARS coronavirus NEG   breath sounds clear to auscultation       Cardiovascular hypertension (no meds), (-) angina Rhythm:Regular Rate:Normal  '14 ECHO: EF 60-65%, mild TR   Neuro/Psych PSYCHIATRIC DISORDERS Dementia TIA   GI/Hepatic negative GI ROS, Neg liver ROS,   Endo/Other  negative endocrine ROS  Renal/GU negative Renal ROS     Musculoskeletal   Abdominal   Peds  Hematology negative hematology ROS (+)   Anesthesia Other Findings H/o breast cancer  Reproductive/Obstetrics                            Anesthesia Physical Anesthesia Plan  ASA: III  Anesthesia Plan: MAC   Post-op Pain Management:    Induction:   PONV Risk Score and Plan: 2 and Ondansetron and Treatment may vary due to age or medical condition  Airway Management Planned: Natural Airway and Simple Face Mask  Additional Equipment:   Intra-op Plan:   Post-operative Plan:   Informed Consent: I have reviewed the patients History and Physical, chart, labs and discussed the procedure including the risks, benefits and alternatives for the proposed anesthesia with the patient or authorized representative who has indicated his/her understanding and acceptance.     Dental advisory given  Plan Discussed with: CRNA and Surgeon  Anesthesia Plan Comments:        Anesthesia Quick Evaluation

## 2019-08-02 NOTE — Discharge Instructions (Addendum)

## 2019-08-02 NOTE — Anesthesia Procedure Notes (Signed)
Procedure Name: MAC Date/Time: 08/02/2019 10:01 AM Performed by: Wanita Chamberlain, CRNA Pre-anesthesia Checklist: Patient identified, Emergency Drugs available, Suction available and Patient being monitored Patient Re-evaluated:Patient Re-evaluated prior to induction Oxygen Delivery Method: Nasal cannula Induction Type: IV induction Placement Confirmation: CO2 detector,  breath sounds checked- equal and bilateral and positive ETCO2 Dental Injury: Teeth and Oropharynx as per pre-operative assessment

## 2019-08-02 NOTE — H&P (Signed)
HPI: 85 year old female with dementia who presents to the office with a anal mass and pain for several weeks to months.  She is also been having some rectal bleeding with bowel movements.  Past Medical History:  Diagnosis Date  . Bladder prolapse, female, acquired   . Cancer ([place])    breast 2002 LEFT RADIATION AND SX DONE  . Dementia ([place])   . Hypertension    OFF BP MEDS X 2 YEARS  . TIA (transient ischemic attack) 2014   Past Surgical History:  Procedure Laterality Date  . ABDOMINAL HYSTERECTOMY     2007  . BLADDER TACH    . BREAST SURGERY Left 2002   CANCER LUMPECTOMY   History reviewed. No pertinent family history.  Social History   Socioeconomic History  . Marital status: Widowed    Spouse name: Not on file  . Number of children: Not on file  . Years of education: Not on file  . Highest education level: Not on file  Occupational History  . Not on file  Social Needs  . Financial resource strain: Not on file  . Food insecurity    Worry: Not on file    Inability: Not on file  . Transportation needs    Medical: Not on file    Non-medical: Not on file  Tobacco Use  . Smoking status: Former Smoker    Packs/day: 1.00    Years: 40.00    Pack years: 40.00    Types: Cigarettes  . Smokeless tobacco: Never Used  . Tobacco comment: QUIT AGE 68  Substance and Sexual Activity  . Alcohol use: Yes    Alcohol/week: 1.0 standard drinks    Types: 1 Glasses of wine per week    Comment: every night 1 OR 2 GLASSES WINE  . Drug use: No  . Sexual activity: Not on file  Lifestyle  . Physical activity    Days per week: Not on file    Minutes per session: Not on file  . Stress: Not on file  Relationships  . Social Herbalist on phone: Not on file    Gets together: Not on file    Attends religious service: Not on file    Active member of club or organization: Not on file    Attends meetings of clubs or organizations: Not on file    Relationship status: Not on  file  . Intimate partner violence    Fear of current or ex partner: Not on file    Emotionally abused: Not on file    Physically abused: Not on file    Forced sexual activity: Not on file  Other Topics Concern  . Not on file  Social History Narrative   ** Merged History Encounter **       No outpatient medications have been marked as taking for the 08/02/19 encounter [facility] Encounter).   No Known Allergies   Review of Systems - General ROS: negative for - chills or fever Respiratory ROS: no cough, shortness of breath, or wheezing Cardiovascular ROS: no chest pain or dyspnea on exertion Gastrointestinal ROS: no abdominal pain, change in bowel habits, or black or bloody stools Genito-Urinary ROS: no dysuria, trouble voiding, or hematuria   BP (!) 153/83   Pulse 68   Temp 98.1 F (36.7 C) (Oral)   Resp 18   Ht 5\' 8"  (1.727 m)   Wt 47.5 kg   SpO2 99%   BMI 15.92 kg/m  Physical Exam  Constitutional:      General: She is not in acute distress.    Appearance: Normal appearance.  HENT:     Head: Normocephalic and atraumatic.     Nose: Nose normal.     Mouth/Throat:     Mouth: Mucous membranes are moist.  Eyes:     Extraocular Movements: Extraocular movements intact.     Conjunctiva/sclera: Conjunctivae normal.     Pupils: Pupils are equal, round, and reactive to light.  Neck:     Musculoskeletal: Normal range of motion and neck supple.  Cardiovascular:     Rate and Rhythm: Normal rate and regular rhythm.  Pulmonary:     Effort: Pulmonary effort is normal. No respiratory distress.  Abdominal:     General: Abdomen is flat. There is no distension.     Palpations: Abdomen is soft.     Tenderness: There is no abdominal tenderness.  Musculoskeletal: Normal range of motion.  Skin:    General: Skin is warm and dry.  Neurological:     Mental Status: She is alert.   Assessment Chronic constipation  Plan Pt has a mass concerning for anal cancer.  I have recommended  biopsy.  Risks include bleeding and pain.  Patient has dementia and her family has healthcare power of attorney.  They also have a letter from her primary care physician stating that she is unable to make her own medical decisions.  We will proceed with biopsy.  All questions were answered.

## 2019-08-02 NOTE — Anesthesia Postprocedure Evaluation (Signed)
Anesthesia Post Note  Patient: Anna Curtis  Procedure(s) Performed: ANAL EXAM UNDER ANESTHESIA, BIOPSY (N/A )     Patient location during evaluation: Phase II Anesthesia Type: MAC Level of consciousness: awake and alert and patient cooperative (reamins confused and agitatied) Pain management: pain level controlled Vital Signs Assessment: post-procedure vital signs reviewed and stable Respiratory status: spontaneous breathing, nonlabored ventilation and respiratory function stable Cardiovascular status: stable and blood pressure returned to baseline Postop Assessment: no apparent nausea or vomiting Anesthetic complications: no    Last Vitals:  Vitals:   08/02/19 1045 08/02/19 1100  BP: (!) 169/80 (!) 188/87  Pulse: 75 74  Resp: 17 20  Temp:  36.5 C  SpO2: 91% 100%    Last Pain:  Vitals:   08/02/19 0712  TempSrc: Oral  PainSc: 0-No pain                 Kyliee Ortego,E. Graylyn Bunney

## 2019-08-02 NOTE — Op Note (Signed)
08/02/2019  10:21 AM  PATIENT:  Anna Curtis  85 y.o. female  Patient Care Team: Josetta Huddle, MD as PCP - General (Internal Medicine)  PRE-OPERATIVE DIAGNOSIS:  ANAL MASS  POST-OPERATIVE DIAGNOSIS:  ANAL MASS  PROCEDURE:  ANAL EXAM UNDER ANESTHESIA, BIOPSY  Surgeon(s): Leighton Ruff, MD  ASSISTANT: none   ANESTHESIA:   local and MAC  SPECIMEN:  Source of Specimen:  anal canal mas  DISPOSITION OF SPECIMEN:  PATHOLOGY  COUNTS:  YES  PLAN OF CARE: Discharge to home after PACU  PATIENT DISPOSITION:  PACU - hemodynamically stable.  INDICATION: 85 year old with dementia who presented to the office with rectal bleeding.  She was noted to have a large mass within her anal canal.  This was concerning for anal cancer.  I recommended exam under anesthesia and biopsy   OR FINDINGS: Approximately 8 cm mass noted in the anal canal with posterior ulceration consistent with probable anal cancer.  DESCRIPTION: the patient was identified in the preoperative holding area and taken to the OR where they were laid on the operating room table.  MAC anesthesia was induced without difficulty. The patient was then positioned in prone jackknife position with buttocks gently taped apart.  The patient was then prepped and draped in usual sterile fashion.  SCDs were noted to be in place prior to the initiation of anesthesia. A surgical timeout was performed indicating the correct patient, procedure, positioning and need for preoperative antibiotics.  A rectal block was performed using Marcaine with epinephrine.    I began with a digital rectal exam.  There was a large mass that involved the entire posterior portion of the anal canal.  It extended from the distal rectum to the perianal region.  There was a centralized cavity noted at right posterior lateral.  All the tissue was friable.  I then placed a Hill-Ferguson anoscope into the anal canal and evaluated this completely.  The distal rectal mucosa  could be identified and appeared normal.  I used a punch biopsy to obtain 2 specimens for pathologic diagnosis.  Hemostasis was achieved using electrocautery and Gelfoam.  The patient was then awakened from anesthesia and sent to the postanesthesia care unit in stable condition.  All counts were correct per operating room staff.

## 2019-08-02 NOTE — Transfer of Care (Signed)
Immediate Anesthesia Transfer of Care Note  Patient: Anna Curtis  Procedure(s) Performed: ANAL EXAM UNDER ANESTHESIA, BIOPSY (N/A )  Patient Location: PACU  Anesthesia Type:MAC  Level of Consciousness: awake, alert , oriented and patient cooperative  Airway & Oxygen Therapy: Patient Spontanous Breathing and Patient connected to nasal cannula oxygen  Post-op Assessment: Report given to RN and Post -op Vital signs reviewed and stable  Post vital signs: Reviewed and stable  Last Vitals:  Vitals Value Taken Time  BP    Temp    Pulse    Resp 15 08/02/19 1030  SpO2    Vitals shown include unvalidated device data.  Last Pain:  Vitals:   08/02/19 0712  TempSrc: Oral  PainSc: 0-No pain      Patients Stated Pain Goal: (unable to determine) ([id number])  Complications: No apparent anesthesia complications

## 2019-08-07 NOTE — Progress Notes (Signed)
GI Location of Tumor / Histology: Malignant neoplasm of the anus.  Anna Curtis presented with rectal bleeding with bowel movements and pain for several weeks to months.  Anal Exam under anesthesia: Approximately 8 cm mass noted in the anal canal with posterior ulceration consistent with probably anal cancer.  Digital rectal exam: There was a large mass that involved the entire posterior portion of the anal canal.  It extended from the distal rectum to the perianal region.  There was a centralized cavity noted at right posterior lateral.  All the tissue was friable.  Biopsies of Anus 08/02/2019     Past/Anticipated interventions by surgeon, if any:  Dr. Marcello Moores 08/02/2019 - Patient has a mass concerning for anal cancer.  I have recommended biopsy. -Patient has dementia and her family has healthcare power of attorney.  Past/Anticipated interventions by medical oncology, if any:   Weight changes, if any: Stable   Bowel/Bladder complaints, if any: Still having the bleeding.  Has some diarrhea.  No urinary changes.  Nausea / Vomiting, if any: No  Pain issues, if any:  Has pain in her bottom.  Any blood per rectum: Has some bleeding with bowel movements.  SAFETY ISSUES:  Prior radiation? 2003, Right breast- done at [facility]  Pacemaker/ICD? No  Possible current pregnancy? Hysterectomy  Is the patient on methotrexate? No  Current Complaints/Details:

## 2019-08-08 ENCOUNTER — Telehealth: Payer: Self-pay

## 2019-08-08 ENCOUNTER — Ambulatory Visit
Admission: RE | Admit: 2019-08-08 | Discharge: 2019-08-08 | Disposition: A | Discharge status: Home / Self Care | Payer: Medicare Other | Source: Ambulatory Visit | Attending: Radiation Oncology | Admitting: Radiation Oncology

## 2019-08-08 ENCOUNTER — Other Ambulatory Visit: Payer: Self-pay

## 2019-08-08 ENCOUNTER — Encounter: Payer: Self-pay | Admitting: Radiation Oncology

## 2019-08-08 VITALS — Ht 68.0 in | Wt 104.0 lb

## 2019-08-08 DIAGNOSIS — Z809 Family history of malignant neoplasm, unspecified: Secondary | ICD-10-CM | POA: Diagnosis not present

## 2019-08-08 DIAGNOSIS — C211 Malignant neoplasm of anal canal: Secondary | ICD-10-CM | POA: Diagnosis not present

## 2019-08-08 DIAGNOSIS — R451 Restlessness and agitation: Secondary | ICD-10-CM

## 2019-08-08 DIAGNOSIS — G309 Alzheimer's disease, unspecified: Secondary | ICD-10-CM | POA: Diagnosis not present

## 2019-08-08 DIAGNOSIS — F0281 Dementia in other diseases classified elsewhere with behavioral disturbance: Secondary | ICD-10-CM

## 2019-08-08 MED ORDER — LORAZEPAM 0.5 MG PO TABS: ORAL_TABLET | ORAL | 0 refills | Status: DC

## 2019-08-08 NOTE — Telephone Encounter (Signed)
Confirmed apt T/D/L of appointment with Dr. Benay Spice on 8/11. Patient has Alzheimer's Dementia, her daughter Warren Lacy has POA. Her son may be accompanying pt to appointment.

## 2019-08-08 NOTE — Telephone Encounter (Signed)
Left VM with t/d/l of initial appointment with Dr. Benay Spice on 9/11 @ 1:45 PM. Requested call back to confirm.

## 2019-08-08 NOTE — Progress Notes (Signed)
Radiation Oncology         [phone] ________________________________  Name: MADDYSON DOSSANTOS        MRN: [id number]  Date of Service: 08/08/2019 DOB: 09-26-1931  EO:[id number], Herbie [place], MD    REFERRING PHYSICIAN: Dr. Marcello Moores  DIAGNOSIS: The encounter diagnosis was Squamous cell carcinoma of anal canal ([place]).   HISTORY OF PRESENT ILLNESS: CHRISTIANNA GUTT is a 85 y.o. female seen at the request of Dr. Marcello Moores for a newly diagnosed anal cancer. The patient has at least a 2 year diagnosis of alzheimers dementia and in the recent past her family noticed she had bleeding through the seat of her pants, increasing complaints of perianal pain, and a few incidents of fecal incontinence. She lives at home and her son lives with her. Her daughters [name] and Shirlean Mylar are her HCPOAs. She was seen by PCP and sent for evaluation once it was noted that she had an anal mass. A CT abdomen pelvis without contrast on 07/18/2019 revealed a large perianal mass and possible adenopathy in the perirectal tissues, and bilateral groins. She was sent for evaluation with CRS and saw Dr. Marcello Moores. She was taken to the OR on 08/02/2019 for an exam under anesthesia revealing an 8 cm mass eminating from the anus. Anoscopy was performed as well and biopsies obtained from the mass were consistent with moderate to poorly differentiate squamous cell carcinoma. She is seen via MyChart encounter along with her children to discuss treatment recommendations. Of note hospice consult was ordered and while she is not enrolled, they are aware of her situation.    PREVIOUS RADIATION THERAPY: No   PAST MEDICAL HISTORY:  Past Medical History:  Diagnosis Date   Bladder prolapse, female, acquired    Cancer ([place])    breast 2002 LEFT RADIATION AND SX DONE   Dementia ([place])    Hypertension    OFF BP MEDS X 2 YEARS   TIA (transient ischemic attack) 2014       PAST SURGICAL HISTORY: Past Surgical History:  Procedure Laterality Date   ABDOMINAL  HYSTERECTOMY     2007   BLADDER [facility]     BREAST SURGERY Left 2002   CANCER LUMPECTOMY     FAMILY HISTORY:  Family History  Problem Relation Age of Onset   Cancer Mother    Cancer Brother      SOCIAL HISTORY:  reports that she has quit smoking. Her smoking use included cigarettes. She has a 40.00 pack-year smoking history. She has never used smokeless tobacco. She reports current alcohol use of about 1.0 standard drinks of alcohol per week. She reports that she does not use drugs. The patient is widowed and lives in [place]. Her son Legrand Como lives with her. Her daughter Warren Lacy and daughter Shirlean Mylar share in [place] responsibilities, and she has another daughter, Orene Desanctis.   ALLERGIES: Patient has no known allergies.   MEDICATIONS:  Current Outpatient Medications  Medication Sig Dispense Refill   LORazepam (ATIVAN) 0.5 MG tablet Crush and mix in food and take 30 minutes prior to radiation treatment or planning. 30 tablet 0   traMADol (ULTRAM) 50 MG tablet Take 1 tablet (50 mg total) by mouth every 6 (six) hours as needed. (Patient not taking: Reported on 08/08/2019) 5 tablet 0   No current facility-administered medications for this encounter.      REVIEW OF SYSTEMS: On review of systems, the patient is unable to verbalize her symptoms because she leaves the call multiple times and is heard  in the background of our video call saying she doesn't want to talk to [place], and she is doing fine and doesn't need a doctor. Her family however verbalize she is quite embarrassed about this problem, is quite private. They believe her to still have pain in the rectum and episodes of bleeding and drainage from the site as well as fecal incontinence. No other concerns are verbalized.     PHYSICAL EXAM:  Wt Readings from Last 3 Encounters:  08/08/19 104 lb (47.2 kg)  08/02/19 104 lb 11.2 oz (47.5 kg)  06/08/13 128 lb (58.1 kg)   Temp Readings from Last 3 Encounters:  08/02/19 97.7 F (36.5 C)    08/12/15 98.2 F (36.8 C) (Oral)  04/30/15 97.5 F (36.4 C)   BP Readings from Last 3 Encounters:  08/02/19 (!) 188/87  08/12/15 169/88  04/30/15 149/67   Pulse Readings from Last 3 Encounters:  08/02/19 74  08/12/15 72  04/30/15 (!) 57   Pain Assessment Pain Score: 4 (Pain in her bottom.)/10  In general this is a well appearing Caucasian female in no acute distress. She's alert and oriented x4 and appropriate throughout the examination. Cardiopulmonary assessment is negative for acute distress and she exhibits normal effort.    ECOG = 1  0 - Asymptomatic (Fully active, able to carry on all predisease activities without restriction)  1 - Symptomatic but completely ambulatory (Restricted in physically strenuous activity but ambulatory and able to carry out work of a light or sedentary nature. For example, light housework, office work)  2 - Symptomatic, <50% in bed during the day (Ambulatory and capable of all self care but unable to carry out any work activities. Up and about more than 50% of waking hours)  3 - Symptomatic, >50% in bed, but not bedbound (Capable of only limited self-care, confined to bed or chair 50% or more of waking hours)  4 - Bedbound (Completely disabled. Cannot carry on any self-care. Totally confined to bed or chair)  5 - Death   Eustace Pen MM, Creech RH, Tormey DC, et al. [phone]). "Toxicity and response criteria of the [facility]". [place] Oncol. 5 (6): 649-55    LABORATORY DATA:  Lab Results  Component Value Date   WBC 9.2 08/12/2015   HGB 12.0 08/12/2015   HCT 35.5 (L) 08/12/2015   MCV 89.9 08/12/2015   PLT 334 08/12/2015   Lab Results  Component Value Date   NA 129 (L) 08/12/2015   K 3.4 (L) 08/12/2015   CL 98 (L) 08/12/2015   CO2 20 (L) 08/12/2015   Lab Results  Component Value Date   ALT 13 (L) 08/12/2015   AST 21 08/12/2015   ALKPHOS 65 08/12/2015   BILITOT 1.1 08/12/2015      RADIOGRAPHY: Ct Abdomen  Pelvis Wo Contrast  Result Date: 07/18/2019 CLINICAL DATA:  85 year old female with rectal mass identified on clinical examination. EXAM: CT ABDOMEN AND PELVIS WITHOUT CONTRAST TECHNIQUE: Multidetector CT imaging of the abdomen and pelvis was performed following the standard protocol without IV contrast. COMPARISON:  None. FINDINGS: Please note that parenchymal abnormalities may be missed without intravenous contrast. Lower chest: No acute abnormality.  The lung bases are clear. Hepatobiliary: No definite hepatic abnormality noted, but decreased sensitivity without IV contrast. The gallbladder is unremarkable. No biliary dilatation. Pancreas: Unremarkable Spleen: Unremarkable. Adrenals/Urinary Tract: The kidneys, RIGHT adrenal gland and bladder are unremarkable. Low-density adreniform enlargement of the LEFT adrenal gland is noted. Stomach/Bowel: Soft tissue  prominence/wall thickening of the rectum (with approximate length of at least 4 cm) noted likely representing this patient's known rectal mass. There are small adjacent perirectal lymph nodes noted. There is no evidence of bowel obstruction or other definite areas of bowel wall thickening. Colonic diverticulosis noted without evidence of diverticulitis. The appendix is normal. Vascular/Lymphatic: Aortic atherosclerosis. Enlarged bilateral inguinal lymph nodes are noted the index RIGHT inguinal node measuring 1.5 x 2.5 cm and index LEFT inguinal node measuring 2 x 3.2 cm. Reproductive: Status post hysterectomy. No adnexal masses. Other: No ascites, focal collection or pneumoperitoneum. Musculoskeletal: No acute or suspicious bony abnormalities are identified. IMPRESSION: 1. Apparent soft tissue prominence/wall thickening of the rectum suspicious for malignancy given history. Mildly enlarged perirectal lymph nodes and enlarged bilateral inguinal lymph nodes may represent metastatic disease. 2. No other definite evidence of metastatic disease, but evaluation of  the solid viscera is slightly limited without intravenous contrast. 3.  Aortic Atherosclerosis (ICD10-I70.0). Electronically Signed   By: Margarette Canada M.D.   On: 07/18/2019 14:24       IMPRESSION/PLAN: 1. Squamous Cell Carcinoma of the Anus. Dr. Lisbeth Renshaw discusses the pathology findings and reviews the nature of Anal cancer. He discusses the findings from her CT and the limitations of no contrast. He recommends staging PET scan to determine her stage and for planning purposes as she would benefit from radiotherapy either in the palliative or definitive setting. She would not likely be a candidate for chemotherapy due to her age and progressive dementia, however the patient's family would like to see her have improvement in her symptoms. We discussed the risks, benefits, short, and long term effects of radiotherapy, and the patient is interested in proceeding. Dr. Lisbeth Renshaw discusses the delivery and logistics of radiotherapy and anticipates a course of either 2 or up to 6 weeks of radiotherapy. We will be in touch with the family as soon as we have PET scan results. She is also going to meet with medical oncology as well.  2. Alzheimer's Dementia. The patient is very confused about her medical condition and that is clear during our call based on her statements. She is a candidate for hospice care and I have discussed her case with Dr. Earnest Rosier at [facility]. Due to the plans for her radiotherapy, they plan to continue palliative care services now, but enroll in hospice care after completion of radiotherapy.  3. Agitation secondary to #2. We discussed the options of using Ativan so the patient may be more cooperative to receive her treatment. A new rx was sent to her pharmacy for her family to crush the medication and mix in applesauce 30 minutes prior to radiotherapy. We will also coordinate with nuclear medicine so she can have iv ativan prior to her PET scan. We discussed the risks and side effect profile of this  medication and her family states agreement with utilizing this.  This encounter was provided by telemedicine platform MyChart.  The patient has given verbal consent for this type of encounter and has been advised to only accept a meeting of this type in a secure network environment. The time spent during this encounter was 60 minutes. The attendants for this meeting include Blenda Nicely, RN, Dr. Lisbeth Renshaw, Hayden Pedro  and [place].Her daughters Warren Lacy, Hart Carwin, and son Legrand Como were all in attendance as well.  During the encounter,  Blenda Nicely, RN, Dr. Lisbeth Renshaw, and Hayden Pedro were located at [facility] Radiation Oncology Department.  Afsana  M Knaus was located at home. Her daughters Warren Lacy, Hart Carwin, and son Legrand Como were all in attendance as well.  The above documentation reflects my direct findings during this shared patient visit. Please see the separate note by Dr. Lisbeth Renshaw on this date for the remainder of the patient's plan of care.    Carola Rhine, PAC

## 2019-08-09 ENCOUNTER — Telehealth: Payer: Self-pay | Admitting: *Deleted

## 2019-08-09 DIAGNOSIS — R451 Restlessness and agitation: Secondary | ICD-10-CM | POA: Insufficient documentation

## 2019-08-09 DIAGNOSIS — F0280 Dementia in other diseases classified elsewhere without behavioral disturbance: Secondary | ICD-10-CM | POA: Insufficient documentation

## 2019-08-09 DIAGNOSIS — G309 Alzheimer's disease, unspecified: Secondary | ICD-10-CM | POA: Insufficient documentation

## 2019-08-09 NOTE — Telephone Encounter (Signed)
Called patient's daughter- [name] to inform of Pet Scan on 08-16-19- arrival time- 10:30 am @ [facility] Radiology, patient to be NPO- 6 hrs. prior to test, patient to abstain from carbs and sugars the day before, spoke with daughter [name] and she is aware of this test and is good with it.

## 2019-08-13 ENCOUNTER — Telehealth: Payer: Self-pay | Admitting: *Deleted

## 2019-08-13 ENCOUNTER — Telehealth: Payer: Self-pay | Admitting: Oncology

## 2019-08-13 NOTE — Telephone Encounter (Signed)
I received a staff msg to reschedule Anna Curtis's appt. When I spoke to her daughter, [name], she wanted to cancel the appt.

## 2019-08-13 NOTE — Telephone Encounter (Signed)
I received a scheduling msg to r/s pt's appt w/Dr. Benay Spice. I cld the pt's daughter and left her a vm to cb to r/s

## 2019-08-13 NOTE — Telephone Encounter (Signed)
Left a voicemail for the patient's daughter Anna Curtis to let her know that PA Dara Lords has spoken with the medical director at [facility] would not be able to be a part of her mother's care no matter which treatment course they were to choose.  Hospice would be able to jump in once radiation was finished.  Call back number was left in the event she has further questions.  Will continue to follow as necessary.  Gloriajean Dell. Leonie Green, BSN

## 2019-08-14 ENCOUNTER — Other Ambulatory Visit: Payer: Self-pay | Admitting: Radiation Oncology

## 2019-08-14 DIAGNOSIS — R451 Restlessness and agitation: Secondary | ICD-10-CM

## 2019-08-14 DIAGNOSIS — C211 Malignant neoplasm of anal canal: Secondary | ICD-10-CM

## 2019-08-14 MED ORDER — LORAZEPAM 2 MG/ML IJ SOLN
0.5000 mg | Freq: Once | INTRAMUSCULAR | Status: DC
  Filled 2019-08-14: qty 1

## 2019-08-14 MED ORDER — LORAZEPAM 2 MG/ML IJ SOLN: 0.5000 mg | Freq: Once | INTRAMUSCULAR | Status: DC

## 2019-08-16 ENCOUNTER — Encounter (HOSPITAL_COMMUNITY): Payer: Medicare Other

## 2019-08-16 ENCOUNTER — Ambulatory Visit: Payer: Medicare Other | Admitting: Oncology

## 2019-08-19 ENCOUNTER — Encounter (HOSPITAL_COMMUNITY)
Admission: RE | Admit: 2019-08-19 | Discharge: 2019-08-19 | Disposition: A | Discharge status: Home / Self Care | Payer: Medicare Other | Source: Ambulatory Visit | Attending: Radiation Oncology | Admitting: Radiation Oncology

## 2019-08-19 ENCOUNTER — Other Ambulatory Visit: Payer: Self-pay

## 2019-08-19 DIAGNOSIS — F0390 Unspecified dementia without behavioral disturbance: Secondary | ICD-10-CM | POA: Insufficient documentation

## 2019-08-19 DIAGNOSIS — Z8673 Personal history of transient ischemic attack (TIA), and cerebral infarction without residual deficits: Secondary | ICD-10-CM | POA: Diagnosis not present

## 2019-08-19 DIAGNOSIS — C7989 Secondary malignant neoplasm of other specified sites: Secondary | ICD-10-CM | POA: Insufficient documentation

## 2019-08-19 DIAGNOSIS — Z853 Personal history of malignant neoplasm of breast: Secondary | ICD-10-CM | POA: Diagnosis not present

## 2019-08-19 DIAGNOSIS — C211 Malignant neoplasm of anal canal: Secondary | ICD-10-CM

## 2019-08-19 DIAGNOSIS — Z87891 Personal history of nicotine dependence: Secondary | ICD-10-CM | POA: Insufficient documentation

## 2019-08-19 DIAGNOSIS — Z79899 Other long term (current) drug therapy: Secondary | ICD-10-CM | POA: Insufficient documentation

## 2019-08-19 DIAGNOSIS — C210 Malignant neoplasm of anus, unspecified: Secondary | ICD-10-CM | POA: Diagnosis not present

## 2019-08-19 LAB — GLUCOSE, CAPILLARY
Glucose-Capillary: 57 mg/dL — ABNORMAL LOW (ref 70–99)
Glucose-Capillary: 82 mg/dL (ref 70–99)

## 2019-08-19 MED ORDER — LORAZEPAM 2 MG/ML IJ SOLN
0.5000 mg | Freq: Once | INTRAMUSCULAR | Status: AC
  Administered 2019-08-19: 0.5 mg via INTRAVENOUS
  Filled 2019-08-19 (×2): qty 0.25

## 2019-08-19 MED ORDER — FLUDEOXYGLUCOSE F - 18 (FDG) INJECTION
5.4100 | Freq: Once | INTRAVENOUS | Status: AC
  Administered 2019-08-19: 5.41 via INTRAVENOUS

## 2019-08-20 ENCOUNTER — Telehealth: Payer: Self-pay | Admitting: Radiation Oncology

## 2019-08-20 NOTE — Telephone Encounter (Signed)
I spoke with the patient's HCPOA, [name] regarding PET scan. Fortunately it appears to be local disease in the pelvis and Dr. Lisbeth Renshaw would still offer the option of 6 weeks of treatment to achieve better chances of local control, versus 2 weeks to palliate her symptoms. [name] states they would like to be as definitive as possible.

## 2019-08-21 ENCOUNTER — Other Ambulatory Visit: Payer: Self-pay

## 2019-08-21 ENCOUNTER — Ambulatory Visit
Admission: RE | Admit: 2019-08-21 | Discharge: 2019-08-21 | Disposition: A | Discharge status: Home / Self Care | Payer: Medicare Other | Source: Ambulatory Visit | Attending: Radiation Oncology | Admitting: Radiation Oncology

## 2019-08-21 DIAGNOSIS — C211 Malignant neoplasm of anal canal: Secondary | ICD-10-CM | POA: Diagnosis not present

## 2019-08-21 DIAGNOSIS — Z510 Encounter for antineoplastic radiation therapy: Secondary | ICD-10-CM | POA: Insufficient documentation

## 2019-08-28 NOTE — Progress Notes (Signed)
  Radiation Oncology         [phone] ________________________________  Name: JAMIKA FRUEH MRN: [id number]  Date: 08/21/2019  DOB: [birth year]  SIMULATION AND TREATMENT PLANNING NOTE   DIAGNOSIS:     ICD-10-CM   1. Squamous cell carcinoma of anal canal (HCC)  C21.1      CONSENT VERIFIED: yes   SET UP: Patient is set-up supine   IMMOBILIZATION: The following immobilization is used: Customized VAC lock bag. This complex treatment device will be used on a daily basis during the patient's treatment.   Diagnosis: Anal cancer   NARRATIVE: The patient was brought to the [facility]. Identity was confirmed. All relevant records and images related to the planned course of therapy were reviewed. Then, the patient was positioned in a stable reproducible clinical set-up for radiation therapy using a customized vac lock bag. Skin markings were placed. The CT images were loaded into the planning software where the target and avoidance structures were contoured.The radiation prescription was entered and confirmed.   The patient will receive 54 Gy in 30 fractions to the high-dose target region.  Daily image guidance is ordered, and this will be used on a daily basis. This is necessary to ensure accurate and precise localization of the target in addition to accurate alignment of the normal tissue structures in this region. This is particularly important given the possible motion of the high-dose target.  Treatment planning then occurred.   I have requested : Intensity Modulated Radiotherapy (IMRT) is medically necessary for this case for the following reason: Dose homogeneity; the target is in close proximity to critical normal structures, including the femoral heads, bladder, and small bowel. IMRT is thus medically to appropriately treat the patient.      ________________________________  Jodelle Gross, MD, PhD

## 2019-08-29 DIAGNOSIS — Z510 Encounter for antineoplastic radiation therapy: Secondary | ICD-10-CM | POA: Diagnosis not present

## 2019-08-29 DIAGNOSIS — C211 Malignant neoplasm of anal canal: Secondary | ICD-10-CM | POA: Diagnosis not present

## 2019-09-02 ENCOUNTER — Ambulatory Visit
Admission: RE | Admit: 2019-09-02 | Discharge: 2019-09-02 | Disposition: A | Discharge status: Home / Self Care | Payer: Medicare Other | Source: Ambulatory Visit | Attending: Radiation Oncology | Admitting: Radiation Oncology

## 2019-09-02 ENCOUNTER — Other Ambulatory Visit: Payer: Self-pay

## 2019-09-02 DIAGNOSIS — Z510 Encounter for antineoplastic radiation therapy: Secondary | ICD-10-CM | POA: Diagnosis not present

## 2019-09-02 DIAGNOSIS — C211 Malignant neoplasm of anal canal: Secondary | ICD-10-CM | POA: Diagnosis not present

## 2019-09-03 ENCOUNTER — Ambulatory Visit
Admission: RE | Admit: 2019-09-03 | Discharge: 2019-09-03 | Disposition: A | Discharge status: Home / Self Care | Payer: Medicare Other | Source: Ambulatory Visit | Attending: Radiation Oncology | Admitting: Radiation Oncology

## 2019-09-03 ENCOUNTER — Other Ambulatory Visit: Payer: Self-pay

## 2019-09-03 DIAGNOSIS — Z510 Encounter for antineoplastic radiation therapy: Secondary | ICD-10-CM | POA: Diagnosis not present

## 2019-09-03 DIAGNOSIS — C211 Malignant neoplasm of anal canal: Secondary | ICD-10-CM | POA: Diagnosis not present

## 2019-09-04 ENCOUNTER — Other Ambulatory Visit: Payer: Self-pay

## 2019-09-04 ENCOUNTER — Ambulatory Visit
Admission: RE | Admit: 2019-09-04 | Discharge: 2019-09-04 | Disposition: A | Discharge status: Home / Self Care | Payer: Medicare Other | Source: Ambulatory Visit | Attending: Radiation Oncology | Admitting: Radiation Oncology

## 2019-09-04 DIAGNOSIS — Z510 Encounter for antineoplastic radiation therapy: Secondary | ICD-10-CM | POA: Diagnosis not present

## 2019-09-04 DIAGNOSIS — C211 Malignant neoplasm of anal canal: Secondary | ICD-10-CM | POA: Diagnosis not present

## 2019-09-05 ENCOUNTER — Other Ambulatory Visit: Payer: Self-pay

## 2019-09-05 ENCOUNTER — Ambulatory Visit
Admission: RE | Admit: 2019-09-05 | Discharge: 2019-09-05 | Disposition: A | Discharge status: Home / Self Care | Payer: Medicare Other | Source: Ambulatory Visit | Attending: Radiation Oncology | Admitting: Radiation Oncology

## 2019-09-05 DIAGNOSIS — C211 Malignant neoplasm of anal canal: Secondary | ICD-10-CM | POA: Insufficient documentation

## 2019-09-05 DIAGNOSIS — Z510 Encounter for antineoplastic radiation therapy: Secondary | ICD-10-CM | POA: Insufficient documentation

## 2019-09-06 ENCOUNTER — Ambulatory Visit
Admission: RE | Admit: 2019-09-06 | Discharge: 2019-09-06 | Disposition: A | Discharge status: Home / Self Care | Payer: Medicare Other | Source: Ambulatory Visit | Attending: Radiation Oncology | Admitting: Radiation Oncology

## 2019-09-06 ENCOUNTER — Other Ambulatory Visit: Payer: Self-pay

## 2019-09-06 DIAGNOSIS — Z510 Encounter for antineoplastic radiation therapy: Secondary | ICD-10-CM | POA: Diagnosis not present

## 2019-09-06 DIAGNOSIS — C211 Malignant neoplasm of anal canal: Secondary | ICD-10-CM | POA: Diagnosis not present

## 2019-09-09 ENCOUNTER — Other Ambulatory Visit: Payer: Self-pay

## 2019-09-09 ENCOUNTER — Ambulatory Visit
Admission: RE | Admit: 2019-09-09 | Discharge: 2019-09-09 | Disposition: A | Discharge status: Home / Self Care | Payer: Medicare Other | Source: Ambulatory Visit | Attending: Radiation Oncology | Admitting: Radiation Oncology

## 2019-09-09 DIAGNOSIS — Z510 Encounter for antineoplastic radiation therapy: Secondary | ICD-10-CM | POA: Diagnosis not present

## 2019-09-09 DIAGNOSIS — C211 Malignant neoplasm of anal canal: Secondary | ICD-10-CM | POA: Diagnosis not present

## 2019-09-10 ENCOUNTER — Ambulatory Visit
Admission: RE | Admit: 2019-09-10 | Discharge: 2019-09-10 | Disposition: A | Discharge status: Home / Self Care | Payer: Medicare Other | Source: Ambulatory Visit | Attending: Radiation Oncology | Admitting: Radiation Oncology

## 2019-09-10 ENCOUNTER — Other Ambulatory Visit: Payer: Self-pay

## 2019-09-10 DIAGNOSIS — C211 Malignant neoplasm of anal canal: Secondary | ICD-10-CM | POA: Diagnosis not present

## 2019-09-10 DIAGNOSIS — Z510 Encounter for antineoplastic radiation therapy: Secondary | ICD-10-CM | POA: Diagnosis not present

## 2019-09-11 ENCOUNTER — Other Ambulatory Visit: Payer: Self-pay

## 2019-09-11 ENCOUNTER — Ambulatory Visit
Admission: RE | Admit: 2019-09-11 | Discharge: 2019-09-11 | Disposition: A | Discharge status: Home / Self Care | Payer: Medicare Other | Source: Ambulatory Visit | Attending: Radiation Oncology | Admitting: Radiation Oncology

## 2019-09-11 DIAGNOSIS — C211 Malignant neoplasm of anal canal: Secondary | ICD-10-CM | POA: Diagnosis not present

## 2019-09-11 DIAGNOSIS — Z510 Encounter for antineoplastic radiation therapy: Secondary | ICD-10-CM | POA: Diagnosis not present

## 2019-09-12 ENCOUNTER — Ambulatory Visit
Admission: RE | Admit: 2019-09-12 | Discharge: 2019-09-12 | Disposition: A | Discharge status: Home / Self Care | Payer: Medicare Other | Source: Ambulatory Visit | Attending: Radiation Oncology | Admitting: Radiation Oncology

## 2019-09-12 ENCOUNTER — Other Ambulatory Visit: Payer: Self-pay

## 2019-09-12 DIAGNOSIS — C211 Malignant neoplasm of anal canal: Secondary | ICD-10-CM | POA: Diagnosis not present

## 2019-09-12 DIAGNOSIS — Z510 Encounter for antineoplastic radiation therapy: Secondary | ICD-10-CM | POA: Diagnosis not present

## 2019-09-13 ENCOUNTER — Ambulatory Visit
Admission: RE | Admit: 2019-09-13 | Discharge: 2019-09-13 | Disposition: A | Discharge status: Home / Self Care | Payer: Medicare Other | Source: Ambulatory Visit | Attending: Radiation Oncology | Admitting: Radiation Oncology

## 2019-09-13 ENCOUNTER — Other Ambulatory Visit: Payer: Self-pay

## 2019-09-13 DIAGNOSIS — Z510 Encounter for antineoplastic radiation therapy: Secondary | ICD-10-CM | POA: Diagnosis not present

## 2019-09-13 DIAGNOSIS — C211 Malignant neoplasm of anal canal: Secondary | ICD-10-CM | POA: Diagnosis not present

## 2019-09-16 ENCOUNTER — Other Ambulatory Visit: Payer: Self-pay

## 2019-09-16 ENCOUNTER — Ambulatory Visit
Admission: RE | Admit: 2019-09-16 | Discharge: 2019-09-16 | Disposition: A | Discharge status: Home / Self Care | Payer: Medicare Other | Source: Ambulatory Visit | Attending: Radiation Oncology | Admitting: Radiation Oncology

## 2019-09-16 DIAGNOSIS — Z510 Encounter for antineoplastic radiation therapy: Secondary | ICD-10-CM | POA: Diagnosis not present

## 2019-09-16 DIAGNOSIS — C211 Malignant neoplasm of anal canal: Secondary | ICD-10-CM | POA: Diagnosis not present

## 2019-09-17 ENCOUNTER — Other Ambulatory Visit: Payer: Self-pay

## 2019-09-17 ENCOUNTER — Ambulatory Visit
Admission: RE | Admit: 2019-09-17 | Discharge: 2019-09-17 | Disposition: A | Discharge status: Home / Self Care | Payer: Medicare Other | Source: Ambulatory Visit | Attending: Radiation Oncology | Admitting: Radiation Oncology

## 2019-09-17 DIAGNOSIS — C211 Malignant neoplasm of anal canal: Secondary | ICD-10-CM | POA: Diagnosis not present

## 2019-09-17 DIAGNOSIS — Z510 Encounter for antineoplastic radiation therapy: Secondary | ICD-10-CM | POA: Diagnosis not present

## 2019-09-18 ENCOUNTER — Telehealth: Payer: Self-pay | Admitting: *Deleted

## 2019-09-18 ENCOUNTER — Other Ambulatory Visit: Payer: Self-pay

## 2019-09-18 ENCOUNTER — Ambulatory Visit
Admission: RE | Admit: 2019-09-18 | Discharge: 2019-09-18 | Disposition: A | Discharge status: Home / Self Care | Payer: Medicare Other | Source: Ambulatory Visit | Attending: Radiation Oncology | Admitting: Radiation Oncology

## 2019-09-18 ENCOUNTER — Telehealth: Payer: Self-pay | Admitting: Radiation Oncology

## 2019-09-18 DIAGNOSIS — Z510 Encounter for antineoplastic radiation therapy: Secondary | ICD-10-CM | POA: Diagnosis not present

## 2019-09-18 DIAGNOSIS — C211 Malignant neoplasm of anal canal: Secondary | ICD-10-CM | POA: Diagnosis not present

## 2019-09-18 MED ORDER — OXYCODONE HCL 5 MG PO TABS: 2.5000 mg | ORAL_TABLET | ORAL | 0 refills | Status: DC | PRN

## 2019-09-18 NOTE — Telephone Encounter (Signed)
I spoke with nursing stating that the patient is starting to have anal pain. Nursing was instructed to discuss oxycodone and this will be called in and should not be taking with etoh as the patient enjoys nightly wine. She can also use dermaplast spray for topical pain to the anus.

## 2019-09-18 NOTE — Telephone Encounter (Signed)
Spoke with Anna Curtis to let her know that a prescription for oxycodone will be sent in to the pharmacy.  I informed her that she could have her mom use dermaplast spray, A&D, and neosporin with pain relief.  She verbalized understanding.  Will continue to follow as necessary.  Anna Curtis. Leonie Green, BSN

## 2019-09-19 ENCOUNTER — Ambulatory Visit
Admission: RE | Admit: 2019-09-19 | Discharge: 2019-09-19 | Disposition: A | Discharge status: Home / Self Care | Payer: Medicare Other | Source: Ambulatory Visit | Attending: Radiation Oncology | Admitting: Radiation Oncology

## 2019-09-19 ENCOUNTER — Other Ambulatory Visit: Payer: Self-pay

## 2019-09-19 DIAGNOSIS — Z510 Encounter for antineoplastic radiation therapy: Secondary | ICD-10-CM | POA: Diagnosis not present

## 2019-09-19 DIAGNOSIS — C211 Malignant neoplasm of anal canal: Secondary | ICD-10-CM | POA: Diagnosis not present

## 2019-09-20 ENCOUNTER — Other Ambulatory Visit: Payer: Self-pay

## 2019-09-20 ENCOUNTER — Ambulatory Visit
Admission: RE | Admit: 2019-09-20 | Discharge: 2019-09-20 | Disposition: A | Discharge status: Home / Self Care | Payer: Medicare Other | Source: Ambulatory Visit | Attending: Radiation Oncology | Admitting: Radiation Oncology

## 2019-09-20 DIAGNOSIS — Z510 Encounter for antineoplastic radiation therapy: Secondary | ICD-10-CM | POA: Diagnosis not present

## 2019-09-20 DIAGNOSIS — C211 Malignant neoplasm of anal canal: Secondary | ICD-10-CM | POA: Diagnosis not present

## 2019-09-23 ENCOUNTER — Other Ambulatory Visit: Payer: Self-pay

## 2019-09-23 ENCOUNTER — Ambulatory Visit
Admission: RE | Admit: 2019-09-23 | Discharge: 2019-09-23 | Disposition: A | Discharge status: Home / Self Care | Payer: Medicare Other | Source: Ambulatory Visit | Attending: Radiation Oncology | Admitting: Radiation Oncology

## 2019-09-23 DIAGNOSIS — C211 Malignant neoplasm of anal canal: Secondary | ICD-10-CM | POA: Diagnosis not present

## 2019-09-23 DIAGNOSIS — Z510 Encounter for antineoplastic radiation therapy: Secondary | ICD-10-CM | POA: Diagnosis not present

## 2019-09-24 ENCOUNTER — Ambulatory Visit
Admission: RE | Admit: 2019-09-24 | Discharge: 2019-09-24 | Disposition: A | Discharge status: Home / Self Care | Payer: Medicare Other | Source: Ambulatory Visit | Attending: Radiation Oncology | Admitting: Radiation Oncology

## 2019-09-24 ENCOUNTER — Other Ambulatory Visit: Payer: Self-pay

## 2019-09-24 DIAGNOSIS — Z510 Encounter for antineoplastic radiation therapy: Secondary | ICD-10-CM | POA: Diagnosis not present

## 2019-09-24 DIAGNOSIS — C211 Malignant neoplasm of anal canal: Secondary | ICD-10-CM | POA: Diagnosis not present

## 2019-09-25 ENCOUNTER — Ambulatory Visit
Admission: RE | Admit: 2019-09-25 | Discharge: 2019-09-25 | Disposition: A | Discharge status: Home / Self Care | Payer: Medicare Other | Source: Ambulatory Visit | Attending: Radiation Oncology | Admitting: Radiation Oncology

## 2019-09-25 ENCOUNTER — Other Ambulatory Visit: Payer: Self-pay

## 2019-09-25 DIAGNOSIS — Z510 Encounter for antineoplastic radiation therapy: Secondary | ICD-10-CM | POA: Diagnosis not present

## 2019-09-25 DIAGNOSIS — C211 Malignant neoplasm of anal canal: Secondary | ICD-10-CM | POA: Diagnosis not present

## 2019-09-26 ENCOUNTER — Ambulatory Visit
Admission: RE | Admit: 2019-09-26 | Discharge: 2019-09-26 | Disposition: A | Discharge status: Home / Self Care | Payer: Medicare Other | Source: Ambulatory Visit | Attending: Radiation Oncology | Admitting: Radiation Oncology

## 2019-09-26 ENCOUNTER — Other Ambulatory Visit: Payer: Self-pay

## 2019-09-26 DIAGNOSIS — Z510 Encounter for antineoplastic radiation therapy: Secondary | ICD-10-CM | POA: Diagnosis not present

## 2019-09-26 DIAGNOSIS — C211 Malignant neoplasm of anal canal: Secondary | ICD-10-CM | POA: Diagnosis not present

## 2019-09-27 ENCOUNTER — Ambulatory Visit
Admission: RE | Admit: 2019-09-27 | Discharge: 2019-09-27 | Disposition: A | Discharge status: Home / Self Care | Payer: Medicare Other | Source: Ambulatory Visit | Attending: Radiation Oncology | Admitting: Radiation Oncology

## 2019-09-27 DIAGNOSIS — C211 Malignant neoplasm of anal canal: Secondary | ICD-10-CM | POA: Diagnosis not present

## 2019-09-27 DIAGNOSIS — Z510 Encounter for antineoplastic radiation therapy: Secondary | ICD-10-CM | POA: Diagnosis not present

## 2019-09-30 ENCOUNTER — Ambulatory Visit
Admission: RE | Admit: 2019-09-30 | Discharge: 2019-09-30 | Disposition: A | Discharge status: Home / Self Care | Payer: Medicare Other | Source: Ambulatory Visit | Attending: Radiation Oncology | Admitting: Radiation Oncology

## 2019-09-30 ENCOUNTER — Other Ambulatory Visit: Payer: Self-pay

## 2019-09-30 DIAGNOSIS — Z510 Encounter for antineoplastic radiation therapy: Secondary | ICD-10-CM | POA: Diagnosis not present

## 2019-09-30 DIAGNOSIS — C211 Malignant neoplasm of anal canal: Secondary | ICD-10-CM | POA: Diagnosis not present

## 2019-10-01 ENCOUNTER — Ambulatory Visit
Admission: RE | Admit: 2019-10-01 | Discharge: 2019-10-01 | Disposition: A | Discharge status: Home / Self Care | Payer: Medicare Other | Source: Ambulatory Visit | Attending: Radiation Oncology | Admitting: Radiation Oncology

## 2019-10-01 ENCOUNTER — Other Ambulatory Visit: Payer: Self-pay

## 2019-10-01 DIAGNOSIS — C211 Malignant neoplasm of anal canal: Secondary | ICD-10-CM | POA: Diagnosis not present

## 2019-10-01 DIAGNOSIS — Z510 Encounter for antineoplastic radiation therapy: Secondary | ICD-10-CM | POA: Diagnosis not present

## 2019-10-02 ENCOUNTER — Ambulatory Visit
Admission: RE | Admit: 2019-10-02 | Discharge: 2019-10-02 | Disposition: A | Discharge status: Home / Self Care | Payer: Medicare Other | Source: Ambulatory Visit | Attending: Radiation Oncology | Admitting: Radiation Oncology

## 2019-10-02 ENCOUNTER — Other Ambulatory Visit: Payer: Self-pay | Admitting: *Deleted

## 2019-10-02 ENCOUNTER — Other Ambulatory Visit: Payer: Self-pay

## 2019-10-02 ENCOUNTER — Other Ambulatory Visit: Payer: Self-pay | Admitting: Radiation Oncology

## 2019-10-02 DIAGNOSIS — C211 Malignant neoplasm of anal canal: Secondary | ICD-10-CM

## 2019-10-02 DIAGNOSIS — R300 Dysuria: Secondary | ICD-10-CM

## 2019-10-02 DIAGNOSIS — Z510 Encounter for antineoplastic radiation therapy: Secondary | ICD-10-CM | POA: Diagnosis not present

## 2019-10-02 LAB — CMP (CANCER CENTER ONLY)
ALT: 7 U/L (ref 0–44)
AST: 11 U/L — ABNORMAL LOW (ref 15–41)
Albumin: 2.9 g/dL — ABNORMAL LOW (ref 3.5–5.0)
Alkaline Phosphatase: 62 U/L (ref 38–126)
Anion gap: 12 (ref 5–15)
BUN: 15 mg/dL (ref 8–23)
CO2: 24 mmol/L (ref 22–32)
Calcium: 8.6 mg/dL — ABNORMAL LOW (ref 8.9–10.3)
Chloride: 98 mmol/L (ref 98–111)
Creatinine: 0.68 mg/dL (ref 0.44–1.00)
GFR, Est AFR Am: >60 mL/min (ref >60)
GFR, Estimated: >60 mL/min (ref >60)
Glucose, Bld: 86 mg/dL (ref 70–99)
Potassium: 3.5 mmol/L (ref 3.5–5.1)
Sodium: 134 mmol/L — ABNORMAL LOW (ref 135–145)
Total Bilirubin: 0.6 mg/dL (ref 0.3–1.2)
Total Protein: 6.2 g/dL — ABNORMAL LOW (ref 6.5–8.1)

## 2019-10-02 LAB — CBC WITH DIFFERENTIAL (CANCER CENTER ONLY)
Abs Immature Granulocytes: 0.02 10*3/uL (ref 0.00–0.07)
Basophils Absolute: 0 10*3/uL (ref 0.0–0.1)
Basophils Relative: 1 %
Eosinophils Absolute: 0.1 10*3/uL (ref 0.0–0.5)
Eosinophils Relative: 3 %
HCT: 31.3 % — ABNORMAL LOW (ref 36.0–46.0)
Hemoglobin: 10.4 g/dL — ABNORMAL LOW (ref 12.0–15.0)
Immature Granulocytes: 0 %
Lymphocytes Relative: 4 %
Lymphs Abs: 0.2 10*3/uL — ABNORMAL LOW (ref 0.7–4.0)
MCH: 28.7 pg (ref 26.0–34.0)
MCHC: 33.2 g/dL (ref 30.0–36.0)
MCV: 86.5 fL (ref 80.0–100.0)
Monocytes Absolute: 0.6 10*3/uL (ref 0.1–1.0)
Monocytes Relative: 11 %
Neutro Abs: 3.9 10*3/uL (ref 1.7–7.7)
Neutrophils Relative %: 81 %
Platelet Count: 344 10*3/uL (ref 150–400)
RBC: 3.62 MIL/uL — ABNORMAL LOW (ref 3.87–5.11)
RDW: 15.9 % — ABNORMAL HIGH (ref 11.5–15.5)
WBC Count: 4.8 10*3/uL (ref 4.0–10.5)
nRBC: 0 % (ref 0.0–0.2)

## 2019-10-03 ENCOUNTER — Telehealth: Payer: Self-pay | Admitting: *Deleted

## 2019-10-03 ENCOUNTER — Other Ambulatory Visit: Payer: Self-pay

## 2019-10-03 ENCOUNTER — Ambulatory Visit
Admission: RE | Admit: 2019-10-03 | Payer: Medicare Other | Source: Ambulatory Visit | Attending: Radiation Oncology | Admitting: Radiation Oncology

## 2019-10-03 ENCOUNTER — Ambulatory Visit
Admission: RE | Admit: 2019-10-03 | Discharge: 2019-10-03 | Disposition: A | Discharge status: Home / Self Care | Payer: Medicare Other | Source: Ambulatory Visit | Attending: Radiation Oncology | Admitting: Radiation Oncology

## 2019-10-03 DIAGNOSIS — C211 Malignant neoplasm of anal canal: Secondary | ICD-10-CM | POA: Diagnosis not present

## 2019-10-03 DIAGNOSIS — Z510 Encounter for antineoplastic radiation therapy: Secondary | ICD-10-CM | POA: Diagnosis not present

## 2019-10-03 NOTE — Telephone Encounter (Signed)
Spoke with the patient's daughter Anna Curtis to let her know that her moms lab work does not show dehydration. PA does not think she needs IV fluids right now and wants her to push fluids as much as possible.  She verbalized understanding.  Will continue to follow as necessary.  Gloriajean Dell. Leonie Green, BSN

## 2019-10-04 ENCOUNTER — Other Ambulatory Visit: Payer: Self-pay

## 2019-10-04 ENCOUNTER — Ambulatory Visit
Admission: RE | Admit: 2019-10-04 | Discharge: 2019-10-04 | Disposition: A | Discharge status: Home / Self Care | Payer: Medicare Other | Source: Ambulatory Visit | Attending: Radiation Oncology | Admitting: Radiation Oncology

## 2019-10-04 DIAGNOSIS — Z510 Encounter for antineoplastic radiation therapy: Secondary | ICD-10-CM | POA: Diagnosis not present

## 2019-10-04 DIAGNOSIS — C211 Malignant neoplasm of anal canal: Secondary | ICD-10-CM | POA: Diagnosis not present

## 2019-10-07 ENCOUNTER — Other Ambulatory Visit: Payer: Self-pay

## 2019-10-07 ENCOUNTER — Ambulatory Visit
Admission: RE | Admit: 2019-10-07 | Discharge: 2019-10-07 | Disposition: A | Discharge status: Home / Self Care | Payer: Medicare Other | Source: Ambulatory Visit | Attending: Radiation Oncology | Admitting: Radiation Oncology

## 2019-10-07 DIAGNOSIS — Z510 Encounter for antineoplastic radiation therapy: Secondary | ICD-10-CM | POA: Diagnosis not present

## 2019-10-07 DIAGNOSIS — C211 Malignant neoplasm of anal canal: Secondary | ICD-10-CM | POA: Insufficient documentation

## 2019-10-08 ENCOUNTER — Ambulatory Visit
Admission: RE | Admit: 2019-10-08 | Discharge: 2019-10-08 | Disposition: A | Discharge status: Home / Self Care | Payer: Medicare Other | Source: Ambulatory Visit | Attending: Radiation Oncology | Admitting: Radiation Oncology

## 2019-10-08 ENCOUNTER — Other Ambulatory Visit: Payer: Self-pay

## 2019-10-08 DIAGNOSIS — C211 Malignant neoplasm of anal canal: Secondary | ICD-10-CM | POA: Diagnosis not present

## 2019-10-08 DIAGNOSIS — Z510 Encounter for antineoplastic radiation therapy: Secondary | ICD-10-CM | POA: Diagnosis not present

## 2019-10-09 ENCOUNTER — Ambulatory Visit
Admission: RE | Admit: 2019-10-09 | Discharge: 2019-10-09 | Disposition: A | Discharge status: Home / Self Care | Payer: Medicare Other | Source: Ambulatory Visit | Attending: Radiation Oncology | Admitting: Radiation Oncology

## 2019-10-09 ENCOUNTER — Other Ambulatory Visit: Payer: Self-pay

## 2019-10-09 DIAGNOSIS — Z510 Encounter for antineoplastic radiation therapy: Secondary | ICD-10-CM | POA: Diagnosis not present

## 2019-10-09 DIAGNOSIS — C211 Malignant neoplasm of anal canal: Secondary | ICD-10-CM | POA: Diagnosis not present

## 2019-10-10 ENCOUNTER — Other Ambulatory Visit: Payer: Self-pay

## 2019-10-10 ENCOUNTER — Ambulatory Visit
Admission: RE | Admit: 2019-10-10 | Discharge: 2019-10-10 | Disposition: A | Discharge status: Home / Self Care | Payer: Medicare Other | Source: Ambulatory Visit | Attending: Radiation Oncology | Admitting: Radiation Oncology

## 2019-10-10 DIAGNOSIS — C211 Malignant neoplasm of anal canal: Secondary | ICD-10-CM | POA: Diagnosis not present

## 2019-10-10 DIAGNOSIS — Z510 Encounter for antineoplastic radiation therapy: Secondary | ICD-10-CM | POA: Diagnosis not present

## 2019-10-11 ENCOUNTER — Encounter: Payer: Self-pay | Admitting: Radiation Oncology

## 2019-10-11 ENCOUNTER — Other Ambulatory Visit: Payer: Self-pay

## 2019-10-11 ENCOUNTER — Ambulatory Visit
Admission: RE | Admit: 2019-10-11 | Discharge: 2019-10-11 | Disposition: A | Discharge status: Home / Self Care | Payer: Medicare Other | Source: Ambulatory Visit | Attending: Radiation Oncology | Admitting: Radiation Oncology

## 2019-10-11 DIAGNOSIS — C211 Malignant neoplasm of anal canal: Secondary | ICD-10-CM | POA: Diagnosis not present

## 2019-10-11 DIAGNOSIS — Z510 Encounter for antineoplastic radiation therapy: Secondary | ICD-10-CM | POA: Diagnosis not present

## 2019-10-15 ENCOUNTER — Ambulatory Visit (INDEPENDENT_AMBULATORY_CARE_PROVIDER_SITE_OTHER): Payer: Medicare Other | Admitting: Podiatry

## 2019-10-15 ENCOUNTER — Encounter: Payer: Self-pay | Admitting: Podiatry

## 2019-10-15 ENCOUNTER — Other Ambulatory Visit: Payer: Self-pay

## 2019-10-15 DIAGNOSIS — B351 Tinea unguium: Secondary | ICD-10-CM | POA: Diagnosis not present

## 2019-10-15 DIAGNOSIS — M79675 Pain in left toe(s): Secondary | ICD-10-CM

## 2019-10-15 DIAGNOSIS — M79674 Pain in right toe(s): Secondary | ICD-10-CM

## 2019-10-15 NOTE — Progress Notes (Signed)
This patient presents to the office for preventative foot care services.  This patient presents the office in a wheelchair and accompanied by her son.  This patient has been diagnosed with Alzheimer's and is presently undergoing radiation for cancer in her anal canal.  Her son says she just finished treatment prior to this visit and is having difficulty sitting due to the pain that she is experiencing.  Ammie  was unable to have her keep her mask on as well as take her blood pressure.  This patient was agitated when I entered the room for preventative foot care services.  Due to her agitation and noncompliance I was unable to completely evaluate her vascular and neurologic status.  Patient has no evidence of any ulcer or infection or drainage noted.  Patient has thick disfigured discolored nails both feet x10.  No evidence of any drainage or paronychia noted.    Onychomycosis  Initial exam debridement of nails x 10.  Discussed reappoint for  this patient with her son and we decided to  schedule her future appointment in 9 weeks when she will no longer be under cancer treatment and should be easier to treat.   Gardiner Barefoot DPM

## 2019-10-23 ENCOUNTER — Other Ambulatory Visit: Payer: Self-pay

## 2019-10-23 ENCOUNTER — Emergency Department (HOSPITAL_COMMUNITY): Payer: Medicare Other

## 2019-10-23 ENCOUNTER — Encounter (HOSPITAL_COMMUNITY): Payer: Self-pay | Admitting: Internal Medicine

## 2019-10-23 ENCOUNTER — Inpatient Hospital Stay (HOSPITAL_COMMUNITY)
Admission: EM | Admit: 2019-10-23 | Discharge: 2019-10-25 | DRG: 070 | Disposition: A | Discharge status: Hospice | Payer: Medicare Other | Attending: Internal Medicine | Admitting: Internal Medicine

## 2019-10-23 ENCOUNTER — Other Ambulatory Visit (HOSPITAL_COMMUNITY): Payer: Medicare Other

## 2019-10-23 DIAGNOSIS — I10 Essential (primary) hypertension: Secondary | ICD-10-CM | POA: Diagnosis not present

## 2019-10-23 DIAGNOSIS — F0281 Dementia in other diseases classified elsewhere with behavioral disturbance: Secondary | ICD-10-CM | POA: Diagnosis not present

## 2019-10-23 DIAGNOSIS — G309 Alzheimer's disease, unspecified: Secondary | ICD-10-CM | POA: Diagnosis not present

## 2019-10-23 DIAGNOSIS — Z515 Encounter for palliative care: Secondary | ICD-10-CM | POA: Diagnosis present

## 2019-10-23 DIAGNOSIS — Y92009 Unspecified place in unspecified non-institutional (private) residence as the place of occurrence of the external cause: Secondary | ICD-10-CM | POA: Diagnosis not present

## 2019-10-23 DIAGNOSIS — Z20828 Contact with and (suspected) exposure to other viral communicable diseases: Secondary | ICD-10-CM | POA: Diagnosis not present

## 2019-10-23 DIAGNOSIS — Z8673 Personal history of transient ischemic attack (TIA), and cerebral infarction without residual deficits: Secondary | ICD-10-CM

## 2019-10-23 DIAGNOSIS — Z681 Body mass index (BMI) 19 or less, adult: Secondary | ICD-10-CM | POA: Diagnosis not present

## 2019-10-23 DIAGNOSIS — F0280 Dementia in other diseases classified elsewhere without behavioral disturbance: Secondary | ICD-10-CM | POA: Diagnosis present

## 2019-10-23 DIAGNOSIS — Z853 Personal history of malignant neoplasm of breast: Secondary | ICD-10-CM

## 2019-10-23 DIAGNOSIS — S0990XA Unspecified injury of head, initial encounter: Secondary | ICD-10-CM | POA: Diagnosis not present

## 2019-10-23 DIAGNOSIS — E876 Hypokalemia: Secondary | ICD-10-CM | POA: Diagnosis not present

## 2019-10-23 DIAGNOSIS — S0101XA Laceration without foreign body of scalp, initial encounter: Secondary | ICD-10-CM | POA: Diagnosis not present

## 2019-10-23 DIAGNOSIS — R4182 Altered mental status, unspecified: Secondary | ICD-10-CM | POA: Diagnosis not present

## 2019-10-23 DIAGNOSIS — E43 Unspecified severe protein-calorie malnutrition: Secondary | ICD-10-CM | POA: Diagnosis present

## 2019-10-23 DIAGNOSIS — Z87891 Personal history of nicotine dependence: Secondary | ICD-10-CM

## 2019-10-23 DIAGNOSIS — L8990 Pressure ulcer of unspecified site, unspecified stage: Secondary | ICD-10-CM | POA: Diagnosis present

## 2019-10-23 DIAGNOSIS — R627 Adult failure to thrive: Secondary | ICD-10-CM | POA: Diagnosis present

## 2019-10-23 DIAGNOSIS — E871 Hypo-osmolality and hyponatremia: Secondary | ICD-10-CM | POA: Diagnosis present

## 2019-10-23 DIAGNOSIS — R638 Other symptoms and signs concerning food and fluid intake: Secondary | ICD-10-CM | POA: Diagnosis present

## 2019-10-23 DIAGNOSIS — S199XXA Unspecified injury of neck, initial encounter: Secondary | ICD-10-CM | POA: Diagnosis not present

## 2019-10-23 DIAGNOSIS — E860 Dehydration: Secondary | ICD-10-CM | POA: Diagnosis not present

## 2019-10-23 DIAGNOSIS — R451 Restlessness and agitation: Secondary | ICD-10-CM | POA: Diagnosis not present

## 2019-10-23 DIAGNOSIS — R197 Diarrhea, unspecified: Secondary | ICD-10-CM | POA: Diagnosis not present

## 2019-10-23 DIAGNOSIS — W19XXXA Unspecified fall, initial encounter: Secondary | ICD-10-CM | POA: Diagnosis not present

## 2019-10-23 DIAGNOSIS — E869 Volume depletion, unspecified: Secondary | ICD-10-CM | POA: Diagnosis present

## 2019-10-23 DIAGNOSIS — R54 Age-related physical debility: Secondary | ICD-10-CM | POA: Diagnosis present

## 2019-10-23 DIAGNOSIS — C211 Malignant neoplasm of anal canal: Secondary | ICD-10-CM | POA: Diagnosis present

## 2019-10-23 DIAGNOSIS — Z66 Do not resuscitate: Secondary | ICD-10-CM | POA: Diagnosis present

## 2019-10-23 DIAGNOSIS — I160 Hypertensive urgency: Secondary | ICD-10-CM | POA: Diagnosis present

## 2019-10-23 DIAGNOSIS — Z923 Personal history of irradiation: Secondary | ICD-10-CM

## 2019-10-23 DIAGNOSIS — G9341 Metabolic encephalopathy: Secondary | ICD-10-CM | POA: Diagnosis not present

## 2019-10-23 DIAGNOSIS — L89312 Pressure ulcer of right buttock, stage 2: Secondary | ICD-10-CM | POA: Diagnosis present

## 2019-10-23 DIAGNOSIS — L89322 Pressure ulcer of left buttock, stage 2: Secondary | ICD-10-CM | POA: Diagnosis present

## 2019-10-23 LAB — CBC WITH DIFFERENTIAL/PLATELET
Abs Immature Granulocytes: 0 10*3/uL (ref 0.00–0.07)
Band Neutrophils: 15 %
Basophils Absolute: 0 10*3/uL (ref 0.0–0.1)
Basophils Relative: 2 %
Eosinophils Absolute: 0.1 10*3/uL (ref 0.0–0.5)
Eosinophils Relative: 4 %
HCT: 30.4 % — ABNORMAL LOW (ref 36.0–46.0)
Hemoglobin: 10.3 g/dL — ABNORMAL LOW (ref 12.0–15.0)
Lymphocytes Relative: 22 %
Lymphs Abs: 0.4 10*3/uL — ABNORMAL LOW (ref 0.7–4.0)
MCH: 28.4 pg (ref 26.0–34.0)
MCHC: 33.9 g/dL (ref 30.0–36.0)
MCV: 83.7 fL (ref 80.0–100.0)
Monocytes Absolute: 0.4 10*3/uL (ref 0.1–1.0)
Monocytes Relative: 18 %
Myelocytes: 2 %
Neutro Abs: 1 10*3/uL — ABNORMAL LOW (ref 1.7–7.7)
Neutrophils Relative %: 37 %
Platelets: 245 10*3/uL (ref 150–400)
RBC: 3.63 MIL/uL — ABNORMAL LOW (ref 3.87–5.11)
RDW: 16.2 % — ABNORMAL HIGH (ref 11.5–15.5)
WBC: 2 10*3/uL — ABNORMAL LOW (ref 4.0–10.5)
nRBC: 0 % (ref 0.0–0.2)

## 2019-10-23 LAB — URINALYSIS, ROUTINE W REFLEX MICROSCOPIC
Bilirubin Urine: NEGATIVE
Glucose, UA: NEGATIVE mg/dL
Ketones, ur: 5 mg/dL — AB
Nitrite: NEGATIVE
Protein, ur: NEGATIVE mg/dL
Specific Gravity, Urine: 1.003 — ABNORMAL LOW (ref 1.005–1.030)
pH: 6 (ref 5.0–8.0)

## 2019-10-23 LAB — COMPREHENSIVE METABOLIC PANEL
ALT: 16 U/L (ref 0–44)
AST: 16 U/L (ref 15–41)
Albumin: 2.9 g/dL — ABNORMAL LOW (ref 3.5–5.0)
Alkaline Phosphatase: 51 U/L (ref 38–126)
Anion gap: 12 (ref 5–15)
BUN: 19 mg/dL (ref 8–23)
CO2: 23 mmol/L (ref 22–32)
Calcium: 8.2 mg/dL — ABNORMAL LOW (ref 8.9–10.3)
Chloride: 96 mmol/L — ABNORMAL LOW (ref 98–111)
Creatinine, Ser: 0.69 mg/dL (ref 0.44–1.00)
GFR calc Af Amer: >60 mL/min (ref >60)
GFR calc non Af Amer: >60 mL/min (ref >60)
Glucose, Bld: 108 mg/dL — ABNORMAL HIGH (ref 70–99)
Potassium: 2.9 mmol/L — ABNORMAL LOW (ref 3.5–5.1)
Sodium: 131 mmol/L — ABNORMAL LOW (ref 135–145)
Total Bilirubin: 0.9 mg/dL (ref 0.3–1.2)
Total Protein: 5.7 g/dL — ABNORMAL LOW (ref 6.5–8.1)

## 2019-10-23 LAB — SARS CORONAVIRUS 2 (TAT 6-24 HRS): SARS Coronavirus 2: NEGATIVE

## 2019-10-23 LAB — TROPONIN I (HIGH SENSITIVITY)
Troponin I (High Sensitivity): 107 ng/L (ref <18)
Troponin I (High Sensitivity): 104 ng/L (ref <18)

## 2019-10-23 MED ORDER — LORAZEPAM 2 MG/ML IJ SOLN
1.0000 mg | Freq: Once | INTRAMUSCULAR | Status: AC
  Administered 2019-10-23: 1 mg via INTRAVENOUS
  Filled 2019-10-23: qty 1

## 2019-10-23 MED ORDER — INFLUENZA VAC A&B SA ADJ QUAD 0.5 ML IM PRSY
0.5000 mL | PREFILLED_SYRINGE | INTRAMUSCULAR | Status: DC
  Filled 2019-10-23: qty 0.5

## 2019-10-23 MED ORDER — ACETAMINOPHEN 325 MG PO TABS: 650.0000 mg | ORAL_TABLET | Freq: Four times a day (QID) | ORAL | Status: DC | PRN

## 2019-10-23 MED ORDER — LIDOCAINE HCL (PF) 1 % IJ SOLN
10.0000 mL | Freq: Once | INTRAMUSCULAR | Status: DC
  Filled 2019-10-23: qty 30

## 2019-10-23 MED ORDER — ENOXAPARIN SODIUM 40 MG/0.4ML ~~LOC~~ SOLN
40.0000 mg | SUBCUTANEOUS | Status: DC
  Administered 2019-10-23: 40 mg via SUBCUTANEOUS
  Filled 2019-10-23: qty 0.4

## 2019-10-23 MED ORDER — ONDANSETRON HCL 4 MG PO TABS: 4.0000 mg | ORAL_TABLET | Freq: Four times a day (QID) | ORAL | Status: DC | PRN

## 2019-10-23 MED ORDER — KCL IN DEXTROSE-NACL 40-5-0.45 MEQ/L-%-% IV SOLN
INTRAVENOUS | Status: DC
  Administered 2019-10-23 – 2019-10-25 (×3): via INTRAVENOUS
  Filled 2019-10-23 (×5): qty 1000

## 2019-10-23 MED ORDER — STERILE WATER FOR INJECTION IJ SOLN
INTRAMUSCULAR | Status: AC
  Administered 2019-10-23: 0.75 mL
  Filled 2019-10-23: qty 10

## 2019-10-23 MED ORDER — ACETAMINOPHEN 650 MG RE SUPP: 650.0000 mg | Freq: Four times a day (QID) | RECTAL | Status: DC | PRN

## 2019-10-23 MED ORDER — ZIPRASIDONE MESYLATE 20 MG IM SOLR
10.0000 mg | Freq: Once | INTRAMUSCULAR | Status: AC
  Administered 2019-10-23: 10 mg via INTRAMUSCULAR
  Filled 2019-10-23: qty 20

## 2019-10-23 MED ORDER — POTASSIUM CHLORIDE CRYS ER 20 MEQ PO TBCR: 40.0000 meq | EXTENDED_RELEASE_TABLET | Freq: Once | ORAL | Status: DC

## 2019-10-23 MED ORDER — SODIUM CHLORIDE 0.9 % IV BOLUS
1000.0000 mL | Freq: Once | INTRAVENOUS | Status: AC
  Administered 2019-10-23: 1000 mL via INTRAVENOUS

## 2019-10-23 MED ORDER — HYDROCODONE-ACETAMINOPHEN 5-325 MG PO TABS: 1.0000 | ORAL_TABLET | ORAL | Status: DC | PRN

## 2019-10-23 MED ORDER — ONDANSETRON HCL 4 MG/2ML IJ SOLN: 4.0000 mg | Freq: Four times a day (QID) | INTRAMUSCULAR | Status: DC | PRN

## 2019-10-23 MED ORDER — HALOPERIDOL LACTATE 5 MG/ML IJ SOLN
1.0000 mg | Freq: Four times a day (QID) | INTRAMUSCULAR | Status: DC | PRN
  Administered 2019-10-23 – 2019-10-24 (×2): 1 mg via INTRAVENOUS
  Filled 2019-10-23 (×2): qty 1

## 2019-10-23 MED ORDER — MORPHINE SULFATE (PF) 2 MG/ML IV SOLN
1.0000 mg | Freq: Once | INTRAVENOUS | Status: AC
  Administered 2019-10-23: 1 mg via INTRAVENOUS
  Filled 2019-10-23: qty 1

## 2019-10-23 NOTE — H&P (Addendum)
[facility] History and Physical  Anna Curtis [id number] DOB: [birth year] DOA: 10/23/2019  Referring physician: ED  PCP: Josetta Huddle, MD   Chief Complaint: Fall, weakness  History limited due to patient's condition.  Spoke with the patient's son at bedside.  HPI: Anna Curtis is a 85 y.o. female with history of rectal cancer status post radiation treatment, hypertension, breast cancer, dementia with confusion at baseline, hypertension, history of TIA, presented to hospital with fall episode yesterday.  Patient is a poor historian and it was unclear whether she had syncope or mechanical fall.  Of note, patient has been having frequent bathroom trips for diarrhea.  She does have history of rectal cancer status post radiation treatment x 6 weeks.  There is no mention of nausea or vomiting but patient has poor oral intake and has lost significant weight as per the patient son at bedside.  Patient does walk by herself to the bathroom and after undergoing radiation treatment for rectal cancer, her diarrhea has actually improved compared to without treatment.  No passage of blood nowadays but stool is watery, 4-5 times a day.  Patient son stated that she does have back pain.  In the ED, patient was noted to have a an laceration on the scalp from fall.  Patient's family mention no shortness of breath cough or fever chills at home.  There is no mention of urinary trouble at home either.  Limited history was obtained. Patient's family reported that patient is confused at baseline from her dementia but is able to recognize people and perform activities like going to the bathroom. Today, she has been recently more agitated and combative.    ED Course: In the ED, patient received potassium p.o. IV fluids, Ativan and Geodon and was put on one-to-one observation and restraints.  Due to increasing combativeness, confusion, significant hypokalemia, imbalance and ongoing diarrhea patient was  considered for observation in the hospital.   Review of Systems:  Limited due to patient's condition.  Past Medical History:  Diagnosis Date   Bladder prolapse, female, acquired    Cancer ([place])    breast 2002 LEFT RADIATION AND SX DONE   Dementia ([place])    Hypertension    OFF BP MEDS X 2 YEARS   TIA (transient ischemic attack) 2014   Past Surgical History:  Procedure Laterality Date   ABDOMINAL HYSTERECTOMY     2007   BLADDER [facility]     BREAST SURGERY Left 2002   CANCER LUMPECTOMY    Social History:  reports that she has quit smoking. Her smoking use included cigarettes. She has a 40.00 pack-year smoking history. She has never used smokeless tobacco. She reports current alcohol use of about 1.0 standard drinks of alcohol per week. She reports that she does not use drugs.  No Known Allergies  Family History  Problem Relation Age of Onset   Cancer Mother    Cancer Brother      Prior to Admission medications   Not on File    Physical Exam: Vitals:   10/23/19 0742 10/23/19 0800 10/23/19 1023 10/23/19 1124  BP: (!) 149/89   (!) 177/79  Pulse: 61 94 67 73  Resp: 19 19  20   SpO2: 97% 100% 99% 97%   Wt Readings from Last 3 Encounters:  08/08/19 47.2 kg  08/02/19 47.5 kg  06/08/13 58.1 kg   There is no height or weight on file to calculate BMI.  General: Elderly female, cachectic, constantly agitated and tossing  around.  Patient's son at bedside HENT: Normocephalic, pupils equally reacting to light and accommodation.  No scleral pallor or icterus noted. Oral mucosa is dry.  Scalp with laceration Chest:  Clear breath sounds.  Diminished breath sounds bilaterally. No crackles or wheezes.  CVS: S1 &S2 heard. No murmur.  Regular rate and rhythm. Abdomen: Soft, nontender, nondistended.  Bowel sounds are heard.  Liver is not palpable, no abdominal mass palpated Extremities: No cyanosis, clubbing or edema.  Peripheral pulses are palpable. Psych: Alert, awake but  restless disoriented.  Fidgety CNS: Moving all extremities. Skin: Warm and dry.  Bilateral stage 2 pressure ulceration present on admission.  Pressure Injury 10/23/19 Buttocks Right Stage II -  Partial thickness loss of dermis presenting as a shallow open ulcer with a red, pink wound bed without slough. (Active)  10/23/19 1438  Location: Buttocks  Location Orientation: Right  Staging: Stage II -  Partial thickness loss of dermis presenting as a shallow open ulcer with a red, pink wound bed without slough.  Wound Description (Comments):   Present on Admission: Yes     Pressure Injury 10/23/19 Buttocks Left Stage II -  Partial thickness loss of dermis presenting as a shallow open ulcer with a red, pink wound bed without slough. (Active)  10/23/19 1438  Location: Buttocks  Location Orientation: Left  Staging: Stage II -  Partial thickness loss of dermis presenting as a shallow open ulcer with a red, pink wound bed without slough.  Wound Description (Comments):   Present on Admission: Yes     Labs on Admission:    CBC: Recent Labs  Lab 10/23/19 0644  WBC 2.0*  NEUTROABS 1.0*  HGB 10.3*  HCT 30.4*  MCV 83.7  PLT 99991111    Basic Metabolic Panel: Recent Labs  Lab 10/23/19 0644  NA 131*  K 2.9*  CL 96*  CO2 23  GLUCOSE 108*  BUN 19  CREATININE 0.69  CALCIUM 8.2*    Liver Function Tests: Recent Labs  Lab 10/23/19 0644  AST 16  ALT 16  ALKPHOS 51  BILITOT 0.9  PROT 5.7*  ALBUMIN 2.9*   No results for input(s): LIPASE, AMYLASE in the last 168 hours. No results for input(s): AMMONIA in the last 168 hours.  Cardiac Enzymes: No results for input(s): CKTOTAL, CKMB, CKMBINDEX, TROPONINI in the last 168 hours.  BNP (last 3 results) No results for input(s): BNP in the last 8760 hours.  ProBNP (last 3 results) No results for input(s): PROBNP in the last 8760 hours.  CBG: No results for input(s): GLUCAP in the last 168 hours.  Lipase     Component Value  Date/Time   LIPASE 23 08/12/2015 1401     Urinalysis    Component Value Date/Time   COLORURINE YELLOW 10/23/2019 0924   APPEARANCEUR HAZY (A) 10/23/2019 0924   LABSPEC 1.003 (L) 10/23/2019 0924   PHURINE 6.0 10/23/2019 0924   GLUCOSEU NEGATIVE 10/23/2019 0924   HGBUR SMALL (A) 10/23/2019 0924   BILIRUBINUR NEGATIVE 10/23/2019 0924   KETONESUR 5 (A) 10/23/2019 0924   PROTEINUR NEGATIVE 10/23/2019 0924   UROBILINOGEN 0.2 08/12/2015 1612   NITRITE NEGATIVE 10/23/2019 0924   LEUKOCYTESUR SMALL (A) 10/23/2019 0924     Drugs of Abuse     Component Value Date/Time   LABOPIA NONE DETECTED 06/08/2013 1241   COCAINSCRNUR NONE DETECTED 06/08/2013 1241   LABBENZ NONE DETECTED 06/08/2013 1241   AMPHETMU NONE DETECTED 06/08/2013 1241   THCU NONE DETECTED 06/08/2013 1241  LABBARB NONE DETECTED 06/08/2013 1241      Radiological Exams on Admission: Ct Head Wo Contrast  Result Date: 10/23/2019 CLINICAL DATA:  Fall EXAM: CT HEAD WITHOUT CONTRAST TECHNIQUE: Contiguous axial images were obtained from the base of the skull through the vertex without intravenous contrast. COMPARISON:  2014 FINDINGS: Brain: There is no acute intracranial hemorrhage, mass effect, or edema. Confluent hypoattenuation in the supratentorial white matter is nonspecific but probably reflects advanced chronic microvascular ischemic changes. Prominence of the ventricles and sulci reflects parenchymal volume loss. These findings have progressed since 2014. Relative prominence of the ventricles is favored to be on an ex vacuo basis. There is no extra-axial fluid collection. Vascular: Intracranial atherosclerotic calcification is present at the skull base. Skull: Calvarium is unremarkable. Sinuses/Orbits: Aerated.  Orbits are unremarkable. Other: None IMPRESSION: No evidence of acute intracranial injury. Progression of chronic microvascular ischemic changes and parenchymal volume loss since 2014. Electronically Signed   By: Macy Mis M.D.   On: 10/23/2019 07:54   Ct Cervical Spine Wo Contrast  Result Date: 10/23/2019 CLINICAL DATA:  Recent fall with facial injury, initial encounter EXAM: CT CERVICAL SPINE WITHOUT CONTRAST TECHNIQUE: Multidetector CT imaging of the cervical spine was performed without intravenous contrast. Multiplanar CT image reconstructions were also generated. COMPARISON:  None. FINDINGS: Alignment: Within normal limits. Skull base and vertebrae: The images are somewhat limited by patient motion artifact. Seven cervical segments are well visualized. Vertebral body height is well maintained. Multilevel facet hypertrophic changes are seen predominately on the left. No acute fracture or acute facet abnormality is noted. Mild disc space narrowing is noted from Soft tissues and spinal canal: Surrounding soft tissue structures demonstrate atherosclerotic calcifications of the carotid arteries. No other specific soft tissue abnormality is noted. Upper chest: Visualized lung apices are within normal limits. Other: None IMPRESSION: Multilevel degenerative change without acute abnormality. Electronically Signed   By: Inez Catalina M.D.   On: 10/23/2019 08:05    EKG: Personally reviewed by me which shows sinus rhythm Assessment/Plan Principal Problem:   Fall at home, initial encounter Active Problems:   HTN (hypertension)   Alzheimer's disease ([place])   Agitation   Hypokalemia   Diarrhea   Acute metabolic encephalopathy   Fall question syncope versus mechanical.  Poor historian.  Possibility of volume depletion from poor oral intake and she does have history of ataxia.  Continue hydration.  Check PT, OT evaluation.  CT head was negative.  CT C-spine with multilevel degenerative changes.  Check 2D echocardiogram.  Telemetry monitor.  And troponins.  Agitation, increased confusion on the background of confusion secondary to dementia.  This could be metabolic secondary to volume depletion, electrolyte  abnormalities.  Rule out infection.  Urinalysis was not impressive.  Chest x-ray will be done.  Received Ativan and Geodon in the ED.  Will closely monitor.  One-to-one precautions, restraints as needed.  CT head scan was negative for acute findings.  Could consider MRI scan if her mentation does not improve.  Keep n.p.o. for now.  Consult speech and swallow with safety with swallowing.  Mild leukopenia.  Will closely monitor.  Check CBC in a.m.  Ongoing diarrhea.  History of rectal cancer undergoing radiation treatment.  Possible radiation induced.  As per the family diarrhea has been improving.  Check stool culture, stool for ova.  No indication for antibiotic at this time.  Significant hypokalemia likely secondary to diarrhea, poor oral intake.  Will replenish iv. Check magnesium levels as well.  Accelerated hypertension on presentation.  Likely secondary to agitation.   Mild hyponatremia.  Sodium of 131 today.  We will continue with hydration.  Check levels in a.m.  History of rectal cancer status post radiation with diarrhea.  Will rule out infective cause of diarrhea.  Patient follows up with Dr. Lisbeth Renshaw and Dr Marcello Moores  Laceration of the scalp.  Local wound care  Debility, deconditioning.  Ongoing cancer treatment.  Patient has significant weight loss on presentation with failure to thrive.  Likely severe protein calorie malnutrition, present on admission..  Will get nutritional evaluation.  Will be n.p.o. until her mentation improved.  Get speech and swallow evaluation.  Bilateral buttocks pressure ulceration stage II present on admission.  Consult wound care.   DVT Prophylaxis: Lovenox   Consultant: None  Code Status: Spoke with the son at bedside.  He states that patient is full code.  He is however going to speak with the rest of the family and make a final decision.  One of his sisters is the health power of attorney.   Microbiology   COVID-19 pending  Antibiotics: None for  now  Family Communication:  Patients' condition and plan of care including tests being ordered have been discussed with the patient and family- who indicate understanding and agree with the plan.  Disposition Plan: Home with home health  Severity of Illness: The appropriate patient status for this patient is OBSERVATION. Observation status is judged to be reasonable and necessary in order to provide the required intensity of service to ensure the patient's safety. The patient's presenting symptoms, physical exam findings, and initial radiographic and laboratory data in the context of their medical condition is felt to place them at decreased risk for further clinical deterioration. Furthermore, it is anticipated that the patient will be medically stable for discharge from the hospital within 2 midnights of admission.   Signed, Flora Lipps, MD [facility] 10/23/2019

## 2019-10-23 NOTE — Progress Notes (Signed)
Patient arrived on unit. Patient very agitated.  Multiple attempts to take complete set of vitals unsuccessful.  Will continue to try again until successful.    Virginia Rochester, RN

## 2019-10-23 NOTE — ED Triage Notes (Signed)
From home lives with son pt hall  Causing laceration to back of head no LOC reported. Pt is very poor historian has reported history rectal cancer and just finish 6 week course of radiation tx.

## 2019-10-23 NOTE — ED Notes (Signed)
Patient can not have sutures completed at this time due to being extremely anxious and irritated. ED PA Beverley Fiedler has been made aware and has witnessed patient behavior.

## 2019-10-23 NOTE — ED Notes (Signed)
Hospitalist at bedside with patient talking with patient son.

## 2019-10-23 NOTE — ED Notes (Signed)
Echo-Cardiology Tech at bedside.

## 2019-10-23 NOTE — ED Notes (Signed)
RN receiving report has requested RN keep patient in ED for 5-10 minutes before transporting to attain appropriate bed for patient. RN receiving patient will call ED if bed is ready for patient before 10 minutes.

## 2019-10-23 NOTE — ED Notes (Signed)
ED Provider at bedside. 

## 2019-10-23 NOTE — ED Notes (Signed)
NT has changed sheets and cleaned and provided fresh undergarments for patient.

## 2019-10-23 NOTE — ED Notes (Signed)
Urine Culture has been sent down to Lab.

## 2019-10-23 NOTE — ED Provider Notes (Signed)
LACERATION REPAIR Performed by: Janeece Fitting Authorized by: Janeece Fitting Consent: Verbal consent obtained. Risks and benefits: risks, benefits and alternatives were discussed Consent given by: patient Patient identity confirmed: provided demographic data Prepped and Draped in normal sterile fashion Wound explored  Laceration Location: occipital   Laceration Length: 4 cm  No Foreign Bodies seen or palpated   Irrigation method: saline syringe Amount of cleaning: standard  Skin closure: close   Number of staples: 2  Technique: closed   Patient tolerance: Patient tolerated the procedure well with no immediate complications.     Janeece Fitting, PA-C 10/23/19 1320    Dorie Rank, MD 10/23/19 1322

## 2019-10-23 NOTE — ED Notes (Signed)
Patient transported to CT 

## 2019-10-23 NOTE — ED Provider Notes (Signed)
Clinical Course as of Oct 22 852  Wed Oct 23, 2019  0846 Labs reviewed.   [JK]  0847 White blood cell count decreased from baseline.  Anemia stable.   [JK]  0847 Hypokalemia is new, potassium ordered.   [JK]    Clinical Course User Index [JK] Dorie Rank, MD   Patient was initially seen by Dr. Roxanne Mins.  Please see his note.  I have reviewed the most recent test results with the patient and her son.  Patient remains significantly confused and agitated.  She lives at home alone and typically her family checks on her.  She is significantly more confused and agitated than usual.  She has been having trouble with significant diarrhea associated with her recent radiation treatments.  UA is pending.   UA without definite uti.  Suspect the diarrhea may be the infectious source contributing to her worsening confusion, agitation.  Pt has required multiple doses of medications to help sedate, manage.  With her weakness, ?etiology of fall, Would benefit from continued IV fluids, consider MRI if mentation does not improve.  I will consult with medical service for admission.   Dorie Rank, MD 10/23/19 1150

## 2019-10-23 NOTE — ED Notes (Signed)
Patient is still moving around anxiously, grabbing and is not calm at all since last dose of medication was given. ED Provider has been made aware.

## 2019-10-23 NOTE — Progress Notes (Signed)
  Echocardiogram 2D Echocardiogram was attempted but the patient was confused and combative. We will try again tomorrow.   Jennette Dubin 10/23/2019, 1:39 PM

## 2019-10-23 NOTE — ED Notes (Signed)
2x staples have been placed in posterior head.

## 2019-10-23 NOTE — Progress Notes (Signed)
PT Cancellation Note  Patient Details Name: Anna Curtis MRN: [id number] DOB: [birth year]   Cancelled Treatment:    Reason Eval/Treat Not Completed: Other (comment)   Pt just admitted and not following any commands.  Was combative/agitated at admission.  With rectal CA and FTT.  Will f/u as able.    Mikael Spray Setsuko Robins 10/23/2019, 4:45 PM

## 2019-10-23 NOTE — ED Provider Notes (Signed)
[facility] DEPT Provider Note   CSN: [id number] Arrival date & time: 10/23/19  [id number]    History   Chief Complaint Chief Complaint  Patient presents with  . Fall    HPI Anna Curtis is a 85 y.o. female.   The history is provided by a relative. The history is limited by the condition of the patient (Dementia).  Fall  She has history of dementia and fell at home.  She did suffer a laceration to her scalp.  She is up-to-date on tetanus immunizations.  Her daughter who is with her is concerned that she has not been eating or drinking very much in the last several days and wants to have her checked for dehydration.  She is normally very confused, but she is somewhat agitated now which is unusual for her.  Past Medical History:  Diagnosis Date  . Bladder prolapse, female, acquired   . Cancer ([place])    breast 2002 LEFT RADIATION AND SX DONE  . Dementia ([place])   . Hypertension    OFF BP MEDS X 2 YEARS  . TIA (transient ischemic attack) 2014    Patient Active Problem List   Diagnosis Date Noted  . Pain due to onychomycosis of toenails of both feet 10/15/2019  . Alzheimer's disease ([place]) 08/09/2019  . Agitation 08/09/2019  . Squamous cell carcinoma of anal canal ([place]) 08/08/2019  . Ataxia 06/08/2013  . Diplopia 06/08/2013  . Dizziness 06/08/2013  . Sinusitis 06/08/2013  . HTN (hypertension) 06/08/2013  . Tobacco abuse 06/08/2013    Past Surgical History:  Procedure Laterality Date  . ABDOMINAL HYSTERECTOMY     2007  . BLADDER TACH    . BREAST SURGERY Left 2002   CANCER LUMPECTOMY     OB History    Gravida  0   Para  0   Term  0   Preterm  0   AB  0   Living        SAB  0   TAB  0   Ectopic  0   Multiple      Live Births               Home Medications    Prior to Admission medications   Not on File    Family History Family History  Problem Relation Age of Onset  . Cancer Mother   . Cancer Brother     Social History Social History   Tobacco Use  . Smoking status: Former Smoker    Packs/day: 1.00    Years: 40.00    Pack years: 40.00    Types: Cigarettes  . Smokeless tobacco: Never Used  . Tobacco comment: QUIT AGE 34  Substance Use Topics  . Alcohol use: Yes    Alcohol/week: 1.0 standard drinks    Types: 1 Glasses of wine per week    Comment: every night 1 OR 2 GLASSES WINE  . Drug use: No     Allergies   Patient has no known allergies.   Review of Systems Review of Systems  Unable to perform ROS: Dementia     Physical Exam Updated Vital Signs BP (!) 144/88 (BP Location: Right Arm)   Pulse 85   Temp (!) 97.5 F (36.4 C) (Oral)   Resp 18   SpO2 100%    Physical Exam Vitals signs and nursing note reviewed.    85 year old female, resting comfortably and in no acute distress. Vital signs  are significant for elevated blood pressure. Oxygen saturation is %, which is normal. Head is normocephalic.  There is a small laceration on the right side of the occiput.Marland Kitchen PERRLA, EOMI. Oropharynx is clear.  Mucous membranes are dry. Neck is nontender without adenopathy or JVD. Back is nontender and there is no CVA tenderness. Lungs are clear without rales, wheezes, or rhonchi. Chest is nontender. Heart has regular rate and rhythm without murmur. Abdomen is soft, flat, nontender without masses or hepatosplenomegaly and peristalsis is normoactive. Extremities have no cyanosis or edema, full range of motion is present. Skin is warm and dry without rash.  Decreased skin turgor. Neurologic: Awake and oriented to person but not place or time, will follow some commands and answer some questions, moderately agitated, cranial nerves are intact, moves all extremities equally.  ED Treatments / Results  Labs (all labs ordered are listed, but only abnormal results are displayed) Labs Reviewed - No data to display  Radiology No results found.  Procedures Procedures   Medications  Ordered in ED Medications - No data to display   Initial Impression / Assessment and Plan / ED Course  I have reviewed the triage vital signs and the nursing notes.  Pertinent labs & imaging results that were available during my care of the patient were reviewed by me and considered in my medical decision making (see chart for details).  Fall at home with scalp laceration.  Will send for CT of head and cervical spine.  Decreased oral intake.  Will check screening labs including urinalysis and give IV fluids.  Case is signed out to Dr. Tomi Bamberger.  Final Clinical Impressions(s) / ED Diagnoses   Final diagnoses:  Fall at home, initial encounter  Scalp laceration, initial encounter    ED Discharge Orders    None       Delora Fuel, MD [id number]

## 2019-10-24 ENCOUNTER — Other Ambulatory Visit (HOSPITAL_COMMUNITY): Payer: Medicare Other

## 2019-10-24 DIAGNOSIS — F0281 Dementia in other diseases classified elsewhere with behavioral disturbance: Secondary | ICD-10-CM | POA: Diagnosis not present

## 2019-10-24 DIAGNOSIS — Z7189 Other specified counseling: Secondary | ICD-10-CM | POA: Diagnosis not present

## 2019-10-24 DIAGNOSIS — C211 Malignant neoplasm of anal canal: Secondary | ICD-10-CM | POA: Diagnosis present

## 2019-10-24 DIAGNOSIS — C20 Malignant neoplasm of rectum: Secondary | ICD-10-CM | POA: Diagnosis not present

## 2019-10-24 DIAGNOSIS — W19XXXA Unspecified fall, initial encounter: Secondary | ICD-10-CM | POA: Diagnosis not present

## 2019-10-24 DIAGNOSIS — I160 Hypertensive urgency: Secondary | ICD-10-CM | POA: Diagnosis present

## 2019-10-24 DIAGNOSIS — F0390 Unspecified dementia without behavioral disturbance: Secondary | ICD-10-CM | POA: Diagnosis not present

## 2019-10-24 DIAGNOSIS — E876 Hypokalemia: Secondary | ICD-10-CM

## 2019-10-24 DIAGNOSIS — L89312 Pressure ulcer of right buttock, stage 2: Secondary | ICD-10-CM | POA: Diagnosis present

## 2019-10-24 DIAGNOSIS — R54 Age-related physical debility: Secondary | ICD-10-CM | POA: Diagnosis present

## 2019-10-24 DIAGNOSIS — Z681 Body mass index (BMI) 19 or less, adult: Secondary | ICD-10-CM | POA: Diagnosis not present

## 2019-10-24 DIAGNOSIS — E43 Unspecified severe protein-calorie malnutrition: Secondary | ICD-10-CM | POA: Diagnosis present

## 2019-10-24 DIAGNOSIS — G9341 Metabolic encephalopathy: Secondary | ICD-10-CM | POA: Diagnosis present

## 2019-10-24 DIAGNOSIS — Y92009 Unspecified place in unspecified non-institutional (private) residence as the place of occurrence of the external cause: Secondary | ICD-10-CM

## 2019-10-24 DIAGNOSIS — L89302 Pressure ulcer of unspecified buttock, stage 2: Secondary | ICD-10-CM | POA: Diagnosis not present

## 2019-10-24 DIAGNOSIS — Z923 Personal history of irradiation: Secondary | ICD-10-CM | POA: Diagnosis not present

## 2019-10-24 DIAGNOSIS — R627 Adult failure to thrive: Secondary | ICD-10-CM | POA: Diagnosis not present

## 2019-10-24 DIAGNOSIS — Z741 Need for assistance with personal care: Secondary | ICD-10-CM | POA: Diagnosis not present

## 2019-10-24 DIAGNOSIS — N8110 Cystocele, unspecified: Secondary | ICD-10-CM | POA: Diagnosis not present

## 2019-10-24 DIAGNOSIS — E869 Volume depletion, unspecified: Secondary | ICD-10-CM | POA: Diagnosis present

## 2019-10-24 DIAGNOSIS — I10 Essential (primary) hypertension: Secondary | ICD-10-CM | POA: Diagnosis present

## 2019-10-24 DIAGNOSIS — Z515 Encounter for palliative care: Secondary | ICD-10-CM

## 2019-10-24 DIAGNOSIS — R638 Other symptoms and signs concerning food and fluid intake: Secondary | ICD-10-CM | POA: Diagnosis present

## 2019-10-24 DIAGNOSIS — S0101XA Laceration without foreign body of scalp, initial encounter: Secondary | ICD-10-CM | POA: Diagnosis present

## 2019-10-24 DIAGNOSIS — R197 Diarrhea, unspecified: Secondary | ICD-10-CM | POA: Diagnosis present

## 2019-10-24 DIAGNOSIS — R296 Repeated falls: Secondary | ICD-10-CM | POA: Diagnosis not present

## 2019-10-24 DIAGNOSIS — I679 Cerebrovascular disease, unspecified: Secondary | ICD-10-CM | POA: Diagnosis not present

## 2019-10-24 DIAGNOSIS — G309 Alzheimer's disease, unspecified: Secondary | ICD-10-CM

## 2019-10-24 DIAGNOSIS — Z20828 Contact with and (suspected) exposure to other viral communicable diseases: Secondary | ICD-10-CM | POA: Diagnosis present

## 2019-10-24 DIAGNOSIS — Z66 Do not resuscitate: Secondary | ICD-10-CM | POA: Diagnosis present

## 2019-10-24 DIAGNOSIS — E871 Hypo-osmolality and hyponatremia: Secondary | ICD-10-CM | POA: Diagnosis present

## 2019-10-24 DIAGNOSIS — L89322 Pressure ulcer of left buttock, stage 2: Secondary | ICD-10-CM | POA: Diagnosis present

## 2019-10-24 DIAGNOSIS — Z87891 Personal history of nicotine dependence: Secondary | ICD-10-CM | POA: Diagnosis not present

## 2019-10-24 DIAGNOSIS — R451 Restlessness and agitation: Secondary | ICD-10-CM | POA: Diagnosis present

## 2019-10-24 DIAGNOSIS — E860 Dehydration: Secondary | ICD-10-CM | POA: Diagnosis not present

## 2019-10-24 DIAGNOSIS — Z8673 Personal history of transient ischemic attack (TIA), and cerebral infarction without residual deficits: Secondary | ICD-10-CM | POA: Diagnosis not present

## 2019-10-24 DIAGNOSIS — Z853 Personal history of malignant neoplasm of breast: Secondary | ICD-10-CM | POA: Diagnosis not present

## 2019-10-24 LAB — BASIC METABOLIC PANEL
Anion gap: 11 (ref 5–15)
BUN: 8 mg/dL (ref 8–23)
CO2: 24 mmol/L (ref 22–32)
Calcium: 8 mg/dL — ABNORMAL LOW (ref 8.9–10.3)
Chloride: 101 mmol/L (ref 98–111)
Creatinine, Ser: 0.52 mg/dL (ref 0.44–1.00)
GFR calc Af Amer: >60 mL/min (ref >60)
GFR calc non Af Amer: >60 mL/min (ref >60)
Glucose, Bld: 105 mg/dL — ABNORMAL HIGH (ref 70–99)
Potassium: 4.1 mmol/L (ref 3.5–5.1)
Sodium: 136 mmol/L (ref 135–145)

## 2019-10-24 LAB — MAGNESIUM: Magnesium: 1.4 mg/dL — ABNORMAL LOW (ref 1.7–2.4)

## 2019-10-24 MED ORDER — MORPHINE SULFATE (PF) 2 MG/ML IV SOLN
1.0000 mg | INTRAVENOUS | Status: DC | PRN
  Administered 2019-10-24 – 2019-10-25 (×5): 1 mg via INTRAVENOUS
  Filled 2019-10-24 (×5): qty 1

## 2019-10-24 MED ORDER — MORPHINE SULFATE (CONCENTRATE) 10 MG/0.5ML PO SOLN: 5.0000 mg | ORAL | Status: DC | PRN

## 2019-10-24 NOTE — Evaluation (Signed)
SLP Cancellation Note  Patient Details Name: Anna Curtis MRN: [id number] DOB: 04-23-1931   Cancelled treatment:        Pt asleep at this time with daughter in room. Dr Domingo Cocking just concluded palliative referral.  Will continue efforts.   Luanna Salk, MS [facility] SLP Acute Rehab Services Pager [phone] Office [phone]    Macario Golds 10/24/2019, 11:13 AM

## 2019-10-24 NOTE — Consult Note (Signed)
Palliative care consult note  Reason for consult: Goals of care in light of rectal cancer, dementia, failure to thrive  Palliative care consult received.  Chart reviewed including personal review of pertinent labs and imaging.  Briefly, [name] is an 85 year old female with history of rectal cancer status post radiation treatment, hypertension, breast cancer, dementia with confusion at baseline, hypertension, TIA presented to the hospital after fall yesterday.  She had laceration on head and is status post staples.  She had lived alone, but her children have been staying with her 24 hours.  She has recently completed radiation for her rectal cancer.  Chart reviews consideration for hospice prior to starting radiation therapy.  I met today in the room with patient's daughter, Anna Curtis.  In looking through the chart, she has healthcare power of attorney paperwork naming her daughters, Anna Curtis, and Anna Curtis as her surrogate decision makers.  Anna Curtis reports that her mother having pain controlled, being able to spend time with family, being able to return to her own home with the most important things.  We discussed clinical course as well as wishes moving forward in regard to care plan.  Concepts specific to code status and rehospitalization discussed.  We discussed difference between a aggressive medical intervention path and a palliative, comfort focused care path.  Values and goals of care important to patient and family were attempted to be elicited.  We discussed that in light of multiple chronic medical problems that have worsened with this acute problem, care should be focused on interventions that are likely to allow the patient to achieve goal of getting back to home and spending time with family. I discussed with family regarding heroic interventions at the end-of-life and they agree this would not be in line with prior expressed wishes for a natural death or be likely to lead to getting well  enough to go back home. They were in agreement with changing CODE STATUS to DO NOT RESUSCITATE.  Concept of Hospice and Palliative Care were discussed.  I then called and was able to reach her other daughter listed on healthcare power of attorney paperwork (Anna Curtis) and reviewed care plan with her.  - DNR/DNI - Agitation much improved following pain medication.  Continue morphine as needed for pain.  On discharge would recommend that she be discharged with oral morphine concentrate as needed for pain. - Family has been in discussion regarding home hospice and would like to work to enroll her in home hospice on discharge.  In the past, they have spoken with [facility] about home hospice services and would like to utilize them moving forward.  Her daughter Anna Curtis reports that the only reason she was not on hospice previously is that they wanted to complete radiation therapy first as she had bleeding and very bad diarrhea with hope that radiation would help with the symptoms.  I placed a referral to transition of care team to help facilitate. - She becomes agitated easily and benefits from having family at the bedside around-the-clock.  Discussed with care team and recommend family members continue to be allowed to rotate (one at a time) and stay around the clock if needed to help provide familiarity and calming presence for her safety.  On discharge, would recommend scripts for: - Morphine Concentrate 62m/0.5ml: 556m(0.2552msublingual every 1 hour as needed for pain or shortness of breath: Disp 71m76mLorazepam 2mg/45mconcentrated solution: 1mg (33mml) s92mingual every 4 hours as needed for anxiety: Disp 71ml -33m  Haldol 10m/ml solution: 0.555m(0.2523msublingual every 4 hours as needed for agitation or nausea: Disp 108m7mQuestions and concerns addressed.     Please let me know if there are other specific areas with which palliative care can be of assistance.  Time in: 0940 Time out: 1100 Total time:  80 minutes  GeneMicheline Rough [place] [phone]

## 2019-10-24 NOTE — Progress Notes (Signed)
OT Cancellation Note  Patient Details Name: Anna Curtis MRN: [id number] DOB: 05-31-1931   Cancelled Treatment:    Reason Eval/Treat Not Completed: Other (comment).  Noted pt is going home with daughter and home hospice. Will sign off.  Lucciano Vitali 10/24/2019, 3:44 PM  Lesle Chris, OTR/L Acute Rehabilitation Services [phone] [facility] pager [phone] office 10/24/2019

## 2019-10-24 NOTE — Progress Notes (Signed)
Hydrologist [facility] 2)  Hospital Liaison: RN note    Notified by [facility] [place], CSW of patient/family request for [facility] services at home after discharge. Chart and patient information under review by [facility] physician. Hospice eligibility pending currently.     Writer spoke with daughter [name]                 to initiate education related to hospice philosophy, services and team approach to care. [name] verbalized understanding of information given. Per discussion, plan is for discharge to home by private vehicle.     Please send signed and completed DNR form home with patient/family. Patient will need prescriptions for discharge comfort medications.     DME needs have been discussed, patient currently has the following equipment in the home:  Walker, shower chair.  Patient/family requests the following DME for delivery to the home: [employer]. [employer] equipment manager has been notified and will contact [employer] to arrange delivery to the home. Home address has been verified and is correct in the chart.               [name] is the family member to contact to arrange time of delivery.     [facility] aware of the above. Please notify [facility] when patient is ready to leave the unit at discharge. (Call [phone] or [phone] after 5pm.) [facility] information and contact numbers given to [name].      Please call with any hospice related questions.     Thank you for this referral.     Farrel Gordon, RN, CCM  [place] (listed on AMION under [facility] [place])  [phone]

## 2019-10-24 NOTE — Progress Notes (Signed)
Initial Nutrition Assessment  INTERVENTION:   -Patient needs weight for admission -If diet is advanced, recommend Ensure Enlive BID and Juven BID  NUTRITION DIAGNOSIS:   Increased nutrient needs related to wound healing, cancer and cancer related treatments as evidenced by estimated needs.  GOAL:   Patient will meet greater than or equal to 90% of their needs  MONITOR:   Labs, Weight trends, I & O's, Skin, Diet advancement  REASON FOR ASSESSMENT:   Consult Assessment of nutrition requirement/status  ASSESSMENT:   85 y.o. female with history of rectal cancer status post radiation treatment, hypertension, breast cancer, dementia with confusion at baseline, hypertension, history of TIA, presented to hospital with fall episode yesterday.  Patient is a poor historian and it was unclear whether she had syncope or mechanical fall.  Patient with dementia, alert/oriented x 1. Pt was combative with staff yesterday. Pt with history of rectal cancer and recently completed 6 weeks of radiation. Pt's family reports pt has not been eating well lately.  Pt currently NPO, awaiting SLP evaluation and Palliative care consult.  Recommend Ensure supplements if diet is advanced given cancer treatments and stage 2 buttocks pressure injuries.   No recent weights in chart. Last recorded weight was 103 lbs on 9/3. Height at that time was 5'8". Pt was underweight at that time. Suspect some degree of malnutrition but unable to determine at this time.   I/Os: +2.4L since admit  Medications reviewed. Labs reviewed:  CBGs: 104-107  NUTRITION - FOCUSED PHYSICAL EXAM:  Pt with combativeness/agitation per chart review.  Diet Order:   Diet Order            Diet NPO time specified  Diet effective now              EDUCATION NEEDS:   No education needs have been identified at this time  Skin:  Skin Assessment: Skin Integrity Issues: Skin Integrity Issues:: Stage II Stage II: right and left  buttocks  Last BM:  11/18  Height:   Ht Readings from Last 1 Encounters:  08/08/19 5\' 8"  (1.727 m)    Weight:   Wt Readings from Last 1 Encounters:  08/08/19 47.2 kg    Ideal Body Weight:  63.6 kg based on height from 5'8"  BMI:  9/3: 15.7 kg/m^2.  Estimated Nutritional Needs:   Kcal:  unable to calculate without recent weight  Protein:  "  Fluid:  "  Clayton Bibles, MS, RD, LDN Inpatient Clinical Dietitian Pager: [phone] After Hours Pager: [phone]

## 2019-10-24 NOTE — Progress Notes (Signed)
Bilateral wrist restraints discontinued at 0000 secondary to daughter at bedside

## 2019-10-24 NOTE — Progress Notes (Signed)
   10/24/19 1507  What Happened  Was fall witnessed? No  Was patient injured? Yes  Patient found on floor  Found by Staff-comment  Stated prior activity other (comment) (Pt in bed)  Follow Up  MD notified yes  Time MD notified 1452  Family notified Yes - comment  Time family notified 1510  Additional tests No  Simple treatment Dressing  Adult Fall Risk Assessment  Risk Factor Category (scoring not indicated) High fall risk per protocol (document High fall risk)  Patient Fall Risk Level High fall risk  Adult Fall Risk Interventions  Required Bundle Interventions *See Row Information* High fall risk - low, moderate, and high requirements implemented  Additional Interventions Family Supervision  Screening for Fall Injury Risk (To be completed on HIGH fall risk patients) - Assessing Need for Low Bed  Risk For Fall Injury- Low Bed Criteria None identified - Continue screening  Will Implement Low Bed and Floor Mats Yes  Screening for Fall Injury Risk (To be completed on HIGH fall risk patients who do not meet crieteria for Low Bed) - Assessing Need for Floor Mats Only  Risk For Fall Injury- Criteria for Floor Mats None identified - No additional interventions needed  Will Implement Floor Mats Yes  Vitals  Temp 97.8 F (36.6 C)  Temp Source Oral  BP (!) 187/89  MAP (mmHg) 118  BP Location Left Arm  BP Method Automatic  Patient Position (if appropriate) Lying  Pulse Rate 78  Resp 18  Oxygen Therapy  SpO2 98 %  O2 Device Room Air  Pain Assessment  Pain Scale Faces  Faces Pain Scale 2  Neurological  Level of Consciousness Alert  Orientation Level Oriented to person  Cognition Poor attention/concentration;Poor safety awareness;Memory impairment  Speech Clear  Pupil Assessment  No  Neuro Symptoms Forgetful;Anxiety  Glasgow Coma Scale  Eye Opening 4  Best Verbal Response (NON-intubated) 5  Best Motor Response 6  Glasgow Coma Scale Score 15  Musculoskeletal  Musculoskeletal  (WDL) X  Assistive Device None  Generalized Weakness Yes  Weight Bearing Restrictions No  Integumentary  Integumentary (WDL) X  Skin Color Appropriate for ethnicity  Skin Condition Dry  Skin Integrity MASD;Abrasion  Abrasion Location Knee  Abrasion Location Orientation Left  Abrasion Intervention Thin film  Moisture Associated Skin Damage Location Buttocks  Moisture Associated Skin Damage Orientation Right  Moisture Associated Skin Damage Intervention Cleansed  Pain Screening  Clinical Progression Not changed

## 2019-10-24 NOTE — Progress Notes (Signed)
Pt high fall risk with fall today.  Son at the bedside, low bed placed in lowest position.  Tele sitter initiated for continued monitoring.  Bed alarm not initiated due to son consistently resting on bed and setting alarm off.  Son encouraged to have distance from bed, however, unable due to comforting patient. Also, denies having door left open because wants privacy.  Son educated on the importance of keeping room door open for staff visibility on pt and intervention requested to prevent fall, son denied request. All other fall precaution interventions in place at this time.

## 2019-10-24 NOTE — Consult Note (Signed)
WOC Nurse Consult Note: Patient receiving care in [place]. Consult completed remotely after review of record, including flowsheet entries for wounds. Reason for Consult: "sacral ulceration" ; flowsheet entries describe stage 2 to bilateral buttocks Wound type:  See flowsheet entries for wound details. Pressure Injury POA: Yes/No/NA Measurement: Wound bed: Drainage (amount, consistency, odor)  Periwound: Dressing procedure/placement/frequency: I have started the Skin Care Standing Order set and initiated skin care products, use of pressure reduction pad when the patient is out of bed, and foam dressings for the bilateral buttocks wounds. Monitor the wound area(s) for worsening of condition such as: Signs/symptoms of infection,  Increase in size,  Development of or worsening of odor, Development of pain, or increased pain at the affected locations.  Notify the medical team if any of these develop.  Thank you for the consult.  [place] nurse will not follow at this time.  Please re-consult the [place] team if needed.  Val Riles, RN, MSN, CWOCN, CNS-BC, pager [phone]

## 2019-10-24 NOTE — Progress Notes (Signed)
PT Cancellation Note  Patient Details Name: Anna Curtis MRN: [id number] DOB: 1931-07-15   Cancelled Treatment:    Reason Eval/Treat Not Completed: Medical issues which prohibited therapy. RN requested PT hold off at this time. Noted pt going home with hospice.  Will check back tomorrow to see if pt appropriate for PT.   Galen Manila 10/24/2019, 3:35 PM

## 2019-10-24 NOTE — Care Management Obs Status (Signed)
MEDICARE OBSERVATION STATUS NOTIFICATION   Patient Details  Name: Anna Curtis MRN: [id number] Date of Birth: 12-Jan-1931   Medicare Observation Status Notification Given:       Trish Mage, LCSW 10/24/2019, 11:54 AM

## 2019-10-24 NOTE — Progress Notes (Signed)
PROGRESS NOTE    Anna Curtis  [id number] DOB: 04/17/1931 DOA: 10/23/2019 PCP: Josetta Huddle, MD   Brief Narrative:  Anna Curtis is a 85 y.o. female with history of rectal cancer status post radiation treatment, hypertension, breast cancer, dementia with confusion at baseline, hypertension, history of TIA, presented to hospital after fall episode at home. She was delirious, agitated, having diarrhea and hypokalemia. She met severe malnutrition and adult failure to thrive criteria. Due to her extremely poor oral intake, potassium supplement is mainly through iv, pending repeated BMP. Confusion and delirium improving with hydration. Palliative care and hospice consulted. If K corrected, pt likely can go home with hospice and po liquid morphine tomorrow   Assessment & Plan:   Principal Problem:   Acute metabolic encephalopathy Active Problems:   HTN (hypertension)   Squamous cell carcinoma of anal canal (HCC)   Alzheimer's disease ([place])   Agitation   Fall at home, initial encounter   Hypokalemia   Pressure injury of skin   Failure to thrive in adult   Severe protein-calorie malnutrition (HCC)   Decreased oral intake   Plan: -AMS, delirium, agitation likely related to severe hypokalemia and volume depletion from diarrhea. Better today. Continue iv hydration. PRN morphine also helps - per daughter at bedside, not much diarrhea today, pending stool study - no labs ordered this morning, will check a stat BMP and mag, if still low, will order further iv kcl +/- iv mag - encourage boost/ensure - palliative care and hospice consulted, plan for home with hospice once medically ready, hopefully tomorrow. Please order po liquid morphine for discharge.    DVT prophylaxis: lovenox Code Status: DNR Family Communication: discussed the care plan with pt's daughter at bedsdie Disposition Plan: hopefully home with hospice in am   Consultants:   Palliative care  Procedures: none   Antimicrobials: none  Subjective: Pt is lethargic and weak, frail looking. She denies abdominal pain or further diarrhea. She barely eats anything. Daughter at bedside would prefer to go home with hospice tomorrow so that she can better prepared tonight. There's no lab ordered for today from admission.   Objective: Vitals:   10/23/19 1124 10/23/19 1200 10/23/19 1847 10/23/19 2129  BP: (!) 177/79 (S) (!) 183/88 (!) 141/83 (!) 143/99  Pulse: 73 75 81 85  Resp: 20 (!) '21 16 20  ' Temp:   97.6 F (36.4 C) 98.3 F (36.8 C)  TempSrc:   Oral Oral  SpO2: 97% 98% 97% 97%    Intake/Output Summary (Last 24 hours) at 10/24/2019 1334 Last data filed at 10/24/2019 0600 Gross per 24 hour  Intake 1447.4 ml  Output -  Net 1447.4 ml   There were no vitals filed for this visit.  Examination:  General exam: frail, malnourished, cachectic, chronic ill appearing Respiratory system: Clear to auscultation. Respiratory effort normal. Cardiovascular system: S1 & S2 heard, RRR. No JVD, murmurs, rubs, gallops or clicks. No pedal edema. Gastrointestinal system: Abdomen is nondistended, soft and nontender. No organomegaly or masses felt. Normal bowel sounds heard. Central nervous system: Alert and oriented. No focal neurological deficits. Extremities: Symmetric 5 x 5 power. Skin: No rashes, lesions or ulcers Psychiatry: flat affect    Data Reviewed: I have personally reviewed following labs and imaging studies  CBC: Recent Labs  Lab 10/23/19 0644  WBC 2.0*  NEUTROABS 1.0*  HGB 10.3*  HCT 30.4*  MCV 83.7  PLT 462   Basic Metabolic Panel: Recent Labs  Lab 10/23/19 0644  NA 131*  K 2.9*  CL 96*  CO2 23  GLUCOSE 108*  BUN 19  CREATININE 0.69  CALCIUM 8.2*   GFR: CrCl cannot be calculated (Unknown ideal weight.). Liver Function Tests: Recent Labs  Lab 10/23/19 0644  AST 16  ALT 16  ALKPHOS 51  BILITOT 0.9  PROT 5.7*  ALBUMIN 2.9*   No results for input(s): LIPASE, AMYLASE  in the last 168 hours. No results for input(s): AMMONIA in the last 168 hours. Coagulation Profile: No results for input(s): INR, PROTIME in the last 168 hours. Cardiac Enzymes: No results for input(s): CKTOTAL, CKMB, CKMBINDEX, TROPONINI in the last 168 hours. BNP (last 3 results) No results for input(s): PROBNP in the last 8760 hours. HbA1C: No results for input(s): HGBA1C in the last 72 hours. CBG: No results for input(s): GLUCAP in the last 168 hours. Lipid Profile: No results for input(s): CHOL, HDL, LDLCALC, TRIG, CHOLHDL, LDLDIRECT in the last 72 hours. Thyroid Function Tests: No results for input(s): TSH, T4TOTAL, FREET4, T3FREE, THYROIDAB in the last 72 hours. Anemia Panel: No results for input(s): VITAMINB12, FOLATE, FERRITIN, TIBC, IRON, RETICCTPCT in the last 72 hours. Sepsis Labs: No results for input(s): PROCALCITON, LATICACIDVEN in the last 168 hours.  Recent Results (from the past 240 hour(s))  SARS CORONAVIRUS 2 (TAT 6-24 HRS) Nasopharyngeal Nasopharyngeal Swab     Status: None   Collection Time: 10/23/19 11:49 AM   Specimen: Nasopharyngeal Swab  Result Value Ref Range Status   SARS Coronavirus 2 NEGATIVE NEGATIVE Final    Comment: (NOTE) SARS-CoV-2 target nucleic acids are NOT DETECTED. The SARS-CoV-2 RNA is generally detectable in upper and lower respiratory specimens during the acute phase of infection. Negative results do not preclude SARS-CoV-2 infection, do not rule out co-infections with other pathogens, and should not be used as the sole basis for treatment or other patient management decisions. Negative results must be combined with clinical observations, patient history, and epidemiological information. The expected result is Negative. Fact Sheet for Patients: SugarRoll.be Fact Sheet for Healthcare Providers: https://www.woods-mathews.com/ This test is not yet approved or cleared by the [place] FDA and   has been authorized for detection and/or diagnosis of SARS-CoV-2 by FDA under an Emergency Use Authorization (EUA). This EUA will remain  in effect (meaning this test can be used) for the duration of the COVID-19 declaration under Section 56 4(b)(1) of the Act, 21 U.S.C. section 360bbb-3(b)(1), unless the authorization is terminated or revoked sooner. Performed at [facility], [place] [address], [place], [place] [address]          Radiology Studies: Ct Head Wo Contrast  Result Date: 10/23/2019 CLINICAL DATA:  Fall EXAM: CT HEAD WITHOUT CONTRAST TECHNIQUE: Contiguous axial images were obtained from the base of the skull through the vertex without intravenous contrast. COMPARISON:  2014 FINDINGS: Brain: There is no acute intracranial hemorrhage, mass effect, or edema. Confluent hypoattenuation in the supratentorial white matter is nonspecific but probably reflects advanced chronic microvascular ischemic changes. Prominence of the ventricles and sulci reflects parenchymal volume loss. These findings have progressed since 2014. Relative prominence of the ventricles is favored to be on an ex vacuo basis. There is no extra-axial fluid collection. Vascular: Intracranial atherosclerotic calcification is present at the skull base. Skull: Calvarium is unremarkable. Sinuses/Orbits: Aerated.  Orbits are unremarkable. Other: None IMPRESSION: No evidence of acute intracranial injury. Progression of chronic microvascular ischemic changes and parenchymal volume loss since 2014. Electronically Signed   By: Macy Mis  M.D.   On: 10/23/2019 07:54   Ct Cervical Spine Wo Contrast  Result Date: 10/23/2019 CLINICAL DATA:  Recent fall with facial injury, initial encounter EXAM: CT CERVICAL SPINE WITHOUT CONTRAST TECHNIQUE: Multidetector CT imaging of the cervical spine was performed without intravenous contrast. Multiplanar CT image reconstructions were also generated. COMPARISON:  None. FINDINGS:  Alignment: Within normal limits. Skull base and vertebrae: The images are somewhat limited by patient motion artifact. Seven cervical segments are well visualized. Vertebral body height is well maintained. Multilevel facet hypertrophic changes are seen predominately on the left. No acute fracture or acute facet abnormality is noted. Mild disc space narrowing is noted from Soft tissues and spinal canal: Surrounding soft tissue structures demonstrate atherosclerotic calcifications of the carotid arteries. No other specific soft tissue abnormality is noted. Upper chest: Visualized lung apices are within normal limits. Other: None IMPRESSION: Multilevel degenerative change without acute abnormality. Electronically Signed   By: Inez Catalina M.D.   On: 10/23/2019 08:05        Scheduled Meds: . enoxaparin (LOVENOX) injection  40 mg Subcutaneous Q24H  . influenza vaccine adjuvanted  0.5 mL Intramuscular Tomorrow-1000  . lidocaine (PF)  10 mL Other Once   Continuous Infusions: . dextrose 5 % and 0.45 % NaCl with KCl 40 mEq/L 100 mL/hr at 10/23/19 1529     LOS: 0 days    Time spent: total 25 min spent on this encounter with >50% on direct patient care and plan formulation.    Paticia Stack, MD [facility] Pager [phone]  If 7PM-7AM, please contact night-coverage [email or web address] Password [email or web address] 10/24/2019, 1:34 PM

## 2019-10-25 DIAGNOSIS — Z515 Encounter for palliative care: Secondary | ICD-10-CM

## 2019-10-25 LAB — BASIC METABOLIC PANEL
Anion gap: 4 — ABNORMAL LOW (ref 5–15)
BUN: 5 mg/dL — ABNORMAL LOW (ref 8–23)
CO2: 28 mmol/L (ref 22–32)
Calcium: 8 mg/dL — ABNORMAL LOW (ref 8.9–10.3)
Chloride: 100 mmol/L (ref 98–111)
Creatinine, Ser: 0.54 mg/dL (ref 0.44–1.00)
GFR calc Af Amer: >60 mL/min (ref >60)
GFR calc non Af Amer: >60 mL/min (ref >60)
Glucose, Bld: 114 mg/dL — ABNORMAL HIGH (ref 70–99)
Potassium: 4 mmol/L (ref 3.5–5.1)
Sodium: 132 mmol/L — ABNORMAL LOW (ref 135–145)

## 2019-10-25 MED ORDER — MAGNESIUM SULFATE IN D5W 1-5 GM/100ML-% IV SOLN: 1.0000 g | Freq: Once | INTRAVENOUS | Status: DC

## 2019-10-25 MED ORDER — LIP MEDEX EX OINT
TOPICAL_OINTMENT | CUTANEOUS | Status: AC
  Filled 2019-10-25: qty 7

## 2019-10-25 MED ORDER — MAGNESIUM SULFATE IN D5W 1-5 GM/100ML-% IV SOLN
1.0000 g | Freq: Once | INTRAVENOUS | Status: AC
  Administered 2019-10-25: 1 g via INTRAVENOUS
  Filled 2019-10-25: qty 100

## 2019-10-25 MED ORDER — MORPHINE SULFATE (CONCENTRATE) 10 MG/0.5ML PO SOLN: 5.0000 mg | Freq: Four times a day (QID) | ORAL | 0 refills | Status: DC | PRN

## 2019-10-25 MED ORDER — ACETAMINOPHEN 160 MG/5ML PO LIQD: 500.0000 mg | ORAL | 0 refills | Status: DC | PRN

## 2019-10-25 MED ORDER — LIDOCAINE 5 % EX PTCH: 1.0000 | MEDICATED_PATCH | CUTANEOUS | 0 refills | Status: AC

## 2019-10-25 NOTE — TOC Transition Note (Signed)
Transition of Care [facility]) - CM/SW Discharge Note   Patient Details  Name: Anna Curtis MRN: [id number] Date of Birth: 1931/11/11  Transition of Care [facility]) CM/SW Contact:  Trish Mage, LCSW Phone Number: 10/25/2019, 10:33 AM   Clinical Narrative:   Met with daughter in patient's room per consult request for hospice referral.  Patient was unresponsive at the time.  Daughter [name] states family has been staying in the home with her mother, taking shifts, including siblings who have been coming from [place].  They are wanting hospice involvement, and select [facility] as provider.  I called to make referral.  At d/c, patient will return home via PTAR and receive hospice services in the home. CSW will set up transportation.  Addendum:  Daughter confirmed that DME is being delivered this AM.  She is insisting on transporting patient home.  Consulted with nursing, and we agreed it is up to family and patient to make that decision.  TOC sign out.    Final next level of care: Home w Hospice Care Barriers to Discharge: No Barriers Identified        Expected Discharge Plan and Services Expected Discharge Plan: [place]   Discharge Planning Services: CM Consult Post Acute Care Choice: Hospice Living arrangements for the past 2 months: Single Family Home                     Prior Living Arrangements/Services Living arrangements for the past 2 months: Single Family Home Lives with:: Adult Children Patient language and need for interpreter reviewed:: Yes Do you feel safe going back to the place where you live?: Yes      Need for Family Participation in Patient Care: Yes (Comment) Care giver support system in place?: Yes (comment)   Criminal Activity/Legal Involvement Pertinent to Current Situation/Hospitalization: No - Comment as needed  Activities of Daily Living Home Assistive Devices/Equipment: Dentures (specify type), Grab bars in shower, Eyeglasses, Grab bars around  toilet ADL Screening (condition at time of admission) Patient's cognitive ability adequate to safely complete daily activities?: No Is the patient deaf or have difficulty hearing?: No Does the patient have difficulty seeing, even when wearing glasses/contacts?: Yes Does the patient have difficulty concentrating, remembering, or making decisions?: Yes Patient able to express need for assistance with ADLs?: Yes Does the patient have difficulty dressing or bathing?: Yes Independently performs ADLs?: No Communication: Independent Dressing (OT): Needs assistance Is this a change from baseline?: Change from baseline, expected to last >3 days Grooming: Needs assistance Is this a change from baseline?: Change from baseline, expected to last >3 days Feeding: Needs assistance Is this a change from baseline?: Change from baseline, expected to last >3 days Bathing: Needs assistance Is this a change from baseline?: Change from baseline, expected to last >3 days Toileting: Needs assistance Is this a change from baseline?: Change from baseline, expected to last >3days In/Out Bed: Needs assistance Is this a change from baseline?: Change from baseline, expected to last >3 days Walks in Home: Needs assistance Is this a change from baseline?: Change from baseline, expected to last >3 days Does the patient have difficulty walking or climbing stairs?: Yes(secondary to weakness) Weakness of Legs: Both Weakness of Arms/Hands: None  Permission Sought/Granted Permission sought to share information with : Family Supports Permission granted to share information with : Yes, Verbal Permission Granted  Share Information with NAME: [name]     Permission granted to share info w Relationship: daughter  Emotional Assessment Appearance:: Appears stated age Attitude/Demeanor/Rapport: Unresponsive Affect (typically observed): Unable to Assess Orientation: : Fluctuating Orientation (Suspected and/or reported  Sundowners) Alcohol / Substance Use: Not Applicable Psych Involvement: No (comment)  Admission diagnosis:  Dehydration [E86.0] Scalp laceration, initial encounter [S01.01XA] Fall at home, initial encounter [W19.XXXA, Y92.009] Altered mental status, unspecified altered mental status type [R41.82] Patient Active Problem List   Diagnosis Date Noted  . Failure to thrive in adult 10/24/2019  . Severe protein-calorie malnutrition ([place]) 10/24/2019  . Decreased oral intake 10/24/2019  . Fall at home, initial encounter 10/23/2019  . Hypokalemia 10/23/2019  . Acute metabolic encephalopathy 32/41/9914  . Pressure injury of skin 10/23/2019  . Pain due to onychomycosis of toenails of both feet 10/15/2019  . Alzheimer's disease ([place]) 08/09/2019  . Agitation 08/09/2019  . Squamous cell carcinoma of anal canal ([place]) 08/08/2019  . Ataxia 06/08/2013  . Diplopia 06/08/2013  . Dizziness 06/08/2013  . Sinusitis 06/08/2013  . HTN (hypertension) 06/08/2013  . Tobacco abuse 06/08/2013   PCP:  Josetta Huddle, MD Pharmacy:   [id number]: [phone]: [phone]    Social Determinants of Health (SDOH) Interventions    Readmission Risk Interventions No flowsheet data found.                 Social Determinants of Health (SDOH) Interventions     Readmission Risk Interventions No flowsheet data found.

## 2019-10-25 NOTE — Discharge Summary (Signed)
Physician Discharge Summary  Anna Curtis YWV:[id number] DOB: 06/14/1931 DOA: 10/23/2019  PCP: Anna Huddle, MD  Admit date: 10/23/2019 Discharge date: 10/25/2019  Admitted From: Home  Disposition: Home hospice  Recommendations for Outpatient Follow-up:  1. Follow up with PCP in 1-2 weeks for staple removal 2. Please obtain BMP/CBC in one week 3. Please follow up on the following pending results:  Home Health:  Equipment/Devices:  Discharge Condition: stable   CODE STATUS: full   Diet recommendation: Heart Healthy     Brief/Interim Summary: Anna Staub Vandlingis a 85 y.o.femalewith history of rectal cancer status post radiation treatment, hypertension, breast cancer, dementia with confusion at baseline, hypertension, history of TIA, presented to hospital after fall episode at home. She was delirious, agitated, having diarrhea and hypokalemia. She met severe malnutrition and adult failure to thrive criteria. Due to her extremely poor oral intake, potassium supplement is mainly through iv, pending repeated BMP. Confusion and delirium improving with hydration. Palliative care and hospice consulted.  Her potassium was corrected.  Her diarrhea had resolved.  She did not have any complaints except for pain in her neck and head at the site of staple insertion due to recent fall.  Daughter requested liquid Tylenol or morphine as morphine can make her drowsy.  I have counseled her that symptom control of pain is more important for her quality of life.  She verbalizes understanding.  Lidocaine patch ordered for neck to help with pain.  On November 20, she was felt to be at baseline and discharged home with hospice care.  Discharge Diagnoses:  Principal Problem:   Acute metabolic encephalopathy Active Problems:   HTN (hypertension)   Squamous cell carcinoma of anal canal (HCC)   Alzheimer's disease ([place])   Agitation   Fall at home, initial encounter   Hypokalemia   Pressure injury of  skin   Failure to thrive in adult   Severe protein-calorie malnutrition (HCC)   Decreased oral intake   Hospice care patient   Palliative care patient    Discharge Instructions  Discharge Instructions    Amb Referral to Palliative Care   Complete by: As directed    Diet - low sodium heart healthy   Complete by: As directed    Increase activity slowly   Complete by: As directed      Allergies as of 10/25/2019   No Known Allergies     Medication List    TAKE these medications   acetaminophen 160 MG/5ML liquid Commonly known as: TYLENOL Take 15.6 mLs (500 mg total) by mouth every 4 (four) hours as needed for fever.   lidocaine 5 % Commonly known as: Lidoderm Place 1 patch onto the skin daily for 20 doses. Remove & Discard patch within 12 hours or as directed by MD on neck   morphine CONCENTRATE 10 MG/0.5ML Soln concentrated solution Take 0.25 mLs (5 mg total) by mouth every 6 (six) hours as needed for moderate pain or severe pain.       No Known Allergies  Consultations:     Procedures/Studies: Ct Head Wo Contrast  Result Date: 10/23/2019 CLINICAL DATA:  Fall EXAM: CT HEAD WITHOUT CONTRAST TECHNIQUE: Contiguous axial images were obtained from the base of the skull through the vertex without intravenous contrast. COMPARISON:  2014 FINDINGS: Brain: There is no acute intracranial hemorrhage, mass effect, or edema. Confluent hypoattenuation in the supratentorial white matter is nonspecific but probably reflects advanced chronic microvascular ischemic changes. Prominence of the ventricles and sulci reflects parenchymal  volume loss. These findings have progressed since 2014. Relative prominence of the ventricles is favored to be on an ex vacuo basis. There is no extra-axial fluid collection. Vascular: Intracranial atherosclerotic calcification is present at the skull base. Skull: Calvarium is unremarkable. Sinuses/Orbits: Aerated.  Orbits are unremarkable. Other: None  IMPRESSION: No evidence of acute intracranial injury. Progression of chronic microvascular ischemic changes and parenchymal volume loss since 2014. Electronically Signed   By: Anna Curtis M.D.   On: 10/23/2019 07:54   Ct Cervical Spine Wo Contrast  Result Date: 10/23/2019 CLINICAL DATA:  Recent fall with facial injury, initial encounter EXAM: CT CERVICAL SPINE WITHOUT CONTRAST TECHNIQUE: Multidetector CT imaging of the cervical spine was performed without intravenous contrast. Multiplanar CT image reconstructions were also generated. COMPARISON:  None. FINDINGS: Alignment: Within normal limits. Skull base and vertebrae: The images are somewhat limited by patient motion artifact. Seven cervical segments are well visualized. Vertebral body height is well maintained. Multilevel facet hypertrophic changes are seen predominately on the left. No acute fracture or acute facet abnormality is noted. Mild disc space narrowing is noted from Soft tissues and spinal canal: Surrounding soft tissue structures demonstrate atherosclerotic calcifications of the carotid arteries. No other specific soft tissue abnormality is noted. Upper chest: Visualized lung apices are within normal limits. Other: None IMPRESSION: Multilevel degenerative change without acute abnormality. Electronically Signed   By: Anna Curtis M.D.   On: 10/23/2019 08:05       Subjective:   Discharge Exam: Vitals:   10/24/19 2100 10/25/19 0615  BP: (!) 158/77 (!) 144/88  Pulse: 87 85  Resp: 18 18  Temp: 97.8 F (36.6 C) (!) 97.5 F (36.4 C)  SpO2: 100%    Vitals:   10/23/19 2129 10/24/19 1507 10/24/19 2100 10/25/19 0615  BP:  (!) 187/89 (!) 158/77 (!) 144/88  Pulse: 85 78 87 85  Resp: '20 18 18 18  ' Temp:  97.8 F (36.6 C) 97.8 F (36.6 C) (!) 97.5 F (36.4 C)  TempSrc: Oral Oral Axillary Oral  SpO2: 97% 98% 100%     General: Pt is alert, awake, not in acute distress Cardiovascular: RRR, S1/S2 +, no rubs, no  gallops Respiratory: CTA bilaterally, no wheezing, no rhonchi Abdominal: Soft, NT, ND, bowel sounds + Extremities: no edema, no cyanosis    The results of significant diagnostics from this hospitalization (including imaging, microbiology, ancillary and laboratory) are listed below for reference.     Microbiology: Recent Results (from the past 240 hour(s))  SARS CORONAVIRUS 2 (TAT 6-24 HRS) Nasopharyngeal Nasopharyngeal Swab     Status: None   Collection Time: 10/23/19 11:49 AM   Specimen: Nasopharyngeal Swab  Result Value Ref Range Status   SARS Coronavirus 2 NEGATIVE NEGATIVE Final    Comment: (NOTE) SARS-CoV-2 target nucleic acids are NOT DETECTED. The SARS-CoV-2 RNA is generally detectable in upper and lower respiratory specimens during the acute phase of infection. Negative results do not preclude SARS-CoV-2 infection, do not rule out co-infections with other pathogens, and should not be used as the sole basis for treatment or other patient management decisions. Negative results must be combined with clinical observations, patient history, and epidemiological information. The expected result is Negative. Fact Sheet for Patients: SugarRoll.be Fact Sheet for Healthcare Providers: https://www.woods-mathews.com/ This test is not yet approved or cleared by the [place] FDA and  has been authorized for detection and/or diagnosis of SARS-CoV-2 by FDA under an Emergency Use Authorization (EUA). This EUA will remain  in effect (  meaning this test can be used) for the duration of the COVID-19 declaration under Section 56 4(b)(1) of the Act, 21 U.S.C. section 360bbb-3(b)(1), unless the authorization is terminated or revoked sooner. Performed at [facility], [place] [address] [place], [place] [address]      Labs: BNP (last 3 results) No results for input(s): BNP in the last 8760 hours. Basic Metabolic Panel: Recent Labs  Lab  10/23/19 0644 10/24/19 1355 10/24/19 1356 10/25/19 0604  NA 131* 136  --  132*  K 2.9* 4.1  --  4.0  CL 96* 101  --  100  CO2 23 24  --  28  GLUCOSE 108* 105*  --  114*  BUN 19 8  --  5*  CREATININE 0.69 0.52  --  0.54  CALCIUM 8.2* 8.0*  --  8.0*  MG  --   --  1.4*  --    Liver Function Tests: Recent Labs  Lab 10/23/19 0644  AST 16  ALT 16  ALKPHOS 51  BILITOT 0.9  PROT 5.7*  ALBUMIN 2.9*   No results for input(s): LIPASE, AMYLASE in the last 168 hours. No results for input(s): AMMONIA in the last 168 hours. CBC: Recent Labs  Lab 10/23/19 0644  WBC 2.0*  NEUTROABS 1.0*  HGB 10.3*  HCT 30.4*  MCV 83.7  PLT 245   Cardiac Enzymes: No results for input(s): CKTOTAL, CKMB, CKMBINDEX, TROPONINI in the last 168 hours. BNP: Invalid input(s): POCBNP CBG: No results for input(s): GLUCAP in the last 168 hours. D-Dimer No results for input(s): DDIMER in the last 72 hours. Hgb A1c No results for input(s): HGBA1C in the last 72 hours. Lipid Profile No results for input(s): CHOL, HDL, LDLCALC, TRIG, CHOLHDL, LDLDIRECT in the last 72 hours. Thyroid function studies No results for input(s): TSH, T4TOTAL, T3FREE, THYROIDAB in the last 72 hours.  Invalid input(s): FREET3 Anemia work up No results for input(s): VITAMINB12, FOLATE, FERRITIN, TIBC, IRON, RETICCTPCT in the last 72 hours. Urinalysis    Component Value Date/Time   COLORURINE YELLOW 10/23/2019 0924   APPEARANCEUR HAZY (A) 10/23/2019 0924   LABSPEC 1.003 (L) 10/23/2019 0924   PHURINE 6.0 10/23/2019 0924   GLUCOSEU NEGATIVE 10/23/2019 0924   HGBUR SMALL (A) 10/23/2019 0924   BILIRUBINUR NEGATIVE 10/23/2019 0924   KETONESUR 5 (A) 10/23/2019 0924   PROTEINUR NEGATIVE 10/23/2019 0924   UROBILINOGEN 0.2 08/12/2015 1612   NITRITE NEGATIVE 10/23/2019 0924   LEUKOCYTESUR SMALL (A) 10/23/2019 0924   Sepsis Labs Invalid input(s): PROCALCITONIN,  WBC,  LACTICIDVEN Microbiology Recent Results (from the past 240  hour(s))  SARS CORONAVIRUS 2 (TAT 6-24 HRS) Nasopharyngeal Nasopharyngeal Swab     Status: None   Collection Time: 10/23/19 11:49 AM   Specimen: Nasopharyngeal Swab  Result Value Ref Range Status   SARS Coronavirus 2 NEGATIVE NEGATIVE Final    Comment: (NOTE) SARS-CoV-2 target nucleic acids are NOT DETECTED. The SARS-CoV-2 RNA is generally detectable in upper and lower respiratory specimens during the acute phase of infection. Negative results do not preclude SARS-CoV-2 infection, do not rule out co-infections with other pathogens, and should not be used as the sole basis for treatment or other patient management decisions. Negative results must be combined with clinical observations, patient history, and epidemiological information. The expected result is Negative. Fact Sheet for Patients: SugarRoll.be Fact Sheet for Healthcare Providers: https://www.woods-mathews.com/ This test is not yet approved or cleared by the [place] FDA and  has been authorized for detection  and/or diagnosis of SARS-CoV-2 by FDA under an Emergency Use Authorization (EUA). This EUA will remain  in effect (meaning this test can be used) for the duration of the COVID-19 declaration under Section 56 4(b)(1) of the Act, 21 U.S.C. section 360bbb-3(b)(1), unless the authorization is terminated or revoked sooner. Performed at [facility], [place] [address], [place], [place] [address]      Time coordinating discharge: Over 30 minutes  SIGNED:   Vicenta Dunning, MD  [facility] 10/25/2019, 7:53 PM Pager   If 7PM-7AM, please contact night-coverage [email or web address] Password [email or web address]

## 2019-10-25 NOTE — Progress Notes (Signed)
Pt being discharged to home with Daughter. Discharge instructions and medication education provided to pt and daughter.

## 2019-10-25 NOTE — Evaluation (Signed)
Physical Therapy Evaluation Patient Details Name: Anna Curtis MRN: [id number] DOB: [birth year] Today's Date: 10/25/2019   History of Present Illness  85 yo female with onset of acute metabolic encephalopathy was admitted for poor intake, diarrhea, wgt loss post radiation for rectal cancer.  Pt is referred to PT pre-discharge.  PMHx:  breast CA, dementia, HTN, rectal CA, falls with scalp laceration, confusion, poor intake  Clinical Impression  Pt is up to walk with HHA from PT and CNA, with daughter encouraging her.  Talked with daughter about phrasing requests to walk as a statement vs asking her to walk or transfer, and that PT would ask for home therapy to help but that Hospice will be making the treatment decisions for her.  Follow acutely to progress independence and work on [employer], and will anticipate likely dc today.    Follow Up Recommendations Home health PT;Supervision for mobility/OOB;Supervision/Assistance - 24 hour    Equipment Recommendations  None recommended by PT    Recommendations for Other Services       Precautions / Restrictions Precautions Precautions: Fall Precaution Comments: pt in telemonitoring and with family in Restrictions Weight Bearing Restrictions: No Other Position/Activity Restrictions: monitor for mood with all treatment      Mobility  Bed Mobility Overal bed mobility: Needs Assistance Bed Mobility: Supine to Sit     Supine to sit: Max assist;+2 for physical assistance;+2 for safety/equipment     General bed mobility comments: PT and CNA were able to pivot her to sitting posture  Transfers Overall transfer level: Needs assistance Equipment used: 2 person hand held assist Transfers: Sit to/from Stand Sit to Stand: Min assist;+2 physical assistance;+2 safety/equipment;From elevated surface         General transfer comment: calm repetition and instructions for standing  Ambulation/Gait Ambulation/Gait assistance: Min  assist;+2 physical assistance;+2 safety/equipment Gait Distance (Feet): 70 Feet Assistive device: 2 person hand held assist Gait Pattern/deviations: Step-through pattern;Wide base of support;Trunk flexed;Drifts right/left Gait velocity: reduced Gait velocity interpretation: <1.8 ft/sec, indicate of risk for recurrent falls General Gait Details: slow paced calm gait with occas stops by pt  Stairs            Wheelchair Mobility    Modified Rankin (Stroke Patients Only)       Balance Overall balance assessment: History of Falls;Needs assistance Sitting-balance support: Feet supported;Bilateral upper extremity supported Sitting balance-Leahy Scale: Fair     Standing balance support: Bilateral upper extremity supported;During functional activity Standing balance-Leahy Scale: Poor                               Pertinent Vitals/Pain Pain Assessment: Faces Faces Pain Scale: Hurts little more Pain Location: back of head and neck Pain Intervention(s): Limited activity within patient's tolerance;Monitored during session;Premedicated before session;Repositioned    Home Living Family/patient expects to be discharged to:: Private residence Living Arrangements: Children Available Help at Discharge: Family;Available 24 hours/day Type of Home: House Home Access: Level entry     Home Layout: One level Home Equipment: Walker - 2 wheels Additional Comments: Hospice is scheduled to follow her    Prior Function Level of Independence: Needs assistance   Gait / Transfers Assistance Needed: able to walk but without help may fall  ADL's / Homemaking Assistance Needed: family cares for her  Comments: responds well to calm voices     Hand Dominance        Extremity/Trunk Assessment  Upper Extremity Assessment Upper Extremity Assessment: Generalized weakness    Lower Extremity Assessment Lower Extremity Assessment: Generalized weakness    Cervical / Trunk  Assessment Cervical / Trunk Assessment: Kyphotic  Communication   Communication: Other (comment)(follows what staff and daughter are saying)  Cognition Arousal/Alertness: Lethargic Behavior During Therapy: Flat affect;Impulsive Overall Cognitive Status: History of cognitive impairments - at baseline                                 General Comments: had dementia at baseline      General Comments General comments (skin integrity, edema, etc.): pt asked to sit up in chair and elevated legs, positioned with blankets for comfort    Exercises     Assessment/Plan    PT Assessment Patient needs continued PT services  PT Problem List Decreased strength;Decreased range of motion;Decreased activity tolerance;Decreased balance;Decreased mobility;Decreased coordination;Decreased cognition;Decreased knowledge of use of DME;Decreased safety awareness;Decreased skin integrity;Pain       PT Treatment Interventions DME instruction;Gait training;Functional mobility training;Therapeutic activities;Therapeutic exercise;Balance training;Neuromuscular re-education;Patient/family education    PT Goals (Current goals can be found in the Care Plan section)  Acute Rehab PT Goals Patient Stated Goal: none stated x to sit in the chair PT Goal Formulation: With family Time For Goal Achievement: 11/08/19 Potential to Achieve Goals: Fair    Frequency Min 1X/week   Barriers to discharge   has family at home with two at a time, Hospice expected    Co-evaluation               AM-PAC PT "6 Clicks" Mobility  Outcome Measure Help needed turning from your back to your side while in a flat bed without using bedrails?: A Lot Help needed moving from lying on your back to sitting on the side of a flat bed without using bedrails?: A Lot Help needed moving to and from a bed to a chair (including a wheelchair)?: A Lot Help needed standing up from a chair using your arms (e.g., wheelchair or  bedside chair)?: A Lot Help needed to walk in hospital room?: A Lot Help needed climbing 3-5 steps with a railing? : Total 6 Click Score: 11    End of Session Equipment Utilized During Treatment: Gait belt Activity Tolerance: Patient limited by fatigue;Treatment limited secondary to medical complications (Comment) Patient left: in chair;with call bell/phone within reach;with nursing/sitter in room;with family/visitor present Nurse Communication: Mobility status PT Visit Diagnosis: Unsteadiness on feet (R26.81);Muscle weakness (generalized) (M62.81);Difficulty in walking, not elsewhere classified (R26.2);Pain Pain - Right/Left: (occiput, neck) Pain - part of body: (occiput, neck)    Time: [id number] PT Time Calculation (min) (ACUTE ONLY): 29 min   Charges:   PT Evaluation $PT Eval Moderate Complexity: 1 Mod PT Treatments $Gait Training: 8-22 mins       Ramond Dial 10/25/2019, 11:27 AM   Mee Hives, PT MS Acute Rehab Dept. Number: [place]

## 2019-11-06 NOTE — Progress Notes (Signed)
  Radiation Oncology         [phone] ________________________________  Name: Anna Curtis MRN: [id number]  Date: 10/11/2019  DOB: Mar 31, 1931  End of Treatment Note  Diagnosis: anal cancer     Indication for treatment:  curative       Radiation treatment dates:   09/02/19 - 10/11/19  Site/dose:   The patient was treated with a course of IMRT using a simultaneous integrated boost technique. Daily image guidance was using during the treatment. The high dose region received a total of 54 Gy.  Narrative: The patient tolerated radiation treatment relatively well.   The patient experienced skin irritation as expected by the end of treatment. The bleeding improve during her course.  Plan: The patient has completed radiation treatment. The patient will return to radiation oncology clinic for routine followup in one month. I advised the patient to call or return sooner if they have any questions or concerns related to their recovery or treatment. ________________________________  Jodelle Gross, M.D., Ph.D.

## 2019-11-11 ENCOUNTER — Telehealth: Payer: Self-pay | Admitting: Radiation Oncology

## 2019-11-11 NOTE — Telephone Encounter (Signed)
  Radiation Oncology         [phone] ________________________________  Name: Anna Curtis MRN: [id number]  Date of Service: 11/11/2019  DOB: Jul 12, 1931  Post Treatment Telephone Note  Diagnosis:  Squamous Cell Carcinoma of the Anus  Interval Since Last Radiation:  5 weeks   09/02/19 - 10/11/19: The patient's anus and regional nodes were treated with a course of IMRT using a simultaneous integrated boost technique. Daily image guidance was using during the treatment. The high dose region received a total of 54 Gy.  Narrative:  The patient was contacted today for routine follow-up. During treatment she did fairly well with radiotherapy but would not allow [place] to examine her to assess her skin for desquamation.   Impression/Plan: 1. Squamous Cell Carcinoma of the Anus. The patient has been doing somewhat poorly with hydration and has had progressive failure to thrive due to her dementia. I let her daughter know on the voice mail that she could call me back to see how things are going at home. She is currently with hospice.     Carola Rhine, PAC

## 2019-11-27 ENCOUNTER — Ambulatory Visit: Payer: Medicare Other | Admitting: Podiatry

## 2019-12-17 ENCOUNTER — Ambulatory Visit: Payer: Medicare Other | Admitting: Podiatry

## 2020-01-13 ENCOUNTER — Telehealth: Payer: Self-pay | Admitting: Licensed Clinical Social Worker

## 2020-01-13 NOTE — Telephone Encounter (Signed)
Palliative Care SW left a vm for patient's daughter, [name], to schedule a home visit.

## 2020-01-16 ENCOUNTER — Ambulatory Visit: Payer: Medicare Other | Attending: Internal Medicine

## 2020-01-24 DIAGNOSIS — M545 Low back pain: Secondary | ICD-10-CM | POA: Diagnosis not present

## 2020-01-24 DIAGNOSIS — N3001 Acute cystitis with hematuria: Secondary | ICD-10-CM | POA: Diagnosis not present

## 2020-01-28 ENCOUNTER — Other Ambulatory Visit: Payer: Self-pay

## 2020-01-28 ENCOUNTER — Other Ambulatory Visit: Payer: Medicare Other | Admitting: Licensed Clinical Social Worker

## 2020-01-28 DIAGNOSIS — Z515 Encounter for palliative care: Secondary | ICD-10-CM

## 2020-01-30 NOTE — Progress Notes (Signed)
COMMUNITY PALLIATIVE CARE SW NOTE  PATIENT NAME: Anna Curtis DOB: [birth year] MRN: [id number]  PRIMARY CARE PROVIDER: Josetta Huddle, MD  RESPONSIBLE PARTY:  Acct ID - Guarantor Home Phone Work Phone Relationship Acct Type  [id number] Anna Curtis, Anna Curtis [phone]  Self P/F     [place], [place], [place] [address]   Due to the COVID-19 crisis, this virtual check-in visit was done via telephone from my office and it was initiated and consent given by thispatient and or family.   PLAN OF CARE and INTERVENTIONS:             1. GOALS OF CARE/ ADVANCE CARE PLANNING:  Goal is for patient to remain in her home.  She is a previous DNR. 2. SOCIAL/EMOTIONAL/SPIRITUAL ASSESSMENT/ INTERVENTIONS:  SW conducted a Sales executive visit with patient's daughter, Narda Amber.  Patient's son and his child currently live with patient and provide care.  Patient was discharged from Hospice services recently due to having a prognosis of greater than six months.  Patient complained of back pain recently and was seen by an MD.  She was diagnosed with a UTI and is currently receiving ABT.  Patient has another daughter that lives close by.  A visit is scheduled for 3/3 at 2pm. 3. PATIENT/CAREGIVER EDUCATION/ COPING:  Provided education regarding the Palliative Care program.  Daughter copes by problem-solving. 4. PERSONAL EMERGENCY PLAN:  Family will contact EMS. 5. COMMUNITY RESOURCES COORDINATION/ HEALTH CARE NAVIGATION:  None. 6. FINANCIAL/LEGAL CONCERNS/INTERVENTIONS:  None.     SOCIAL HX:  Social History   Tobacco Use  . Smoking status: Former Smoker    Packs/day: 1.00    Years: 40.00    Pack years: 40.00    Types: Cigarettes  . Smokeless tobacco: Never Used  . Tobacco comment: QUIT AGE 77  Substance Use Topics  . Alcohol use: Yes    Alcohol/week: 1.0 standard drinks    Types: 1 Glasses of wine per week    Comment: every night 1 OR 2 GLASSES WINE    CODE STATUS:  Prior DNR ADVANCED DIRECTIVES:  HCPOA MOST FORM COMPLETE:  N HOSPICE EDUCATION PROVIDED:  Patient was previously under Hospice care. PPS:  Appetite is normal.  Ambulates independently. Duration of visit and documentation:  30 minutes.      Creola Corn Ona Roehrs, LCSW

## 2020-02-03 DIAGNOSIS — M533 Sacrococcygeal disorders, not elsewhere classified: Secondary | ICD-10-CM | POA: Diagnosis not present

## 2020-02-03 DIAGNOSIS — C210 Malignant neoplasm of anus, unspecified: Secondary | ICD-10-CM | POA: Diagnosis not present

## 2020-02-03 DIAGNOSIS — R102 Pelvic and perineal pain: Secondary | ICD-10-CM | POA: Diagnosis not present

## 2020-02-03 DIAGNOSIS — Z1389 Encounter for screening for other disorder: Secondary | ICD-10-CM | POA: Diagnosis not present

## 2020-02-05 ENCOUNTER — Ambulatory Visit: Payer: Self-pay | Admitting: Podiatry

## 2020-02-05 ENCOUNTER — Other Ambulatory Visit: Payer: Self-pay | Admitting: Internal Medicine

## 2020-02-05 ENCOUNTER — Other Ambulatory Visit: Payer: Self-pay

## 2020-02-05 ENCOUNTER — Other Ambulatory Visit: Payer: Medicare Other | Admitting: *Deleted

## 2020-02-05 DIAGNOSIS — Z515 Encounter for palliative care: Secondary | ICD-10-CM

## 2020-02-05 NOTE — Progress Notes (Signed)
COMMUNITY PALLIATIVE CARE RN NOTE  PATIENT NAME: Anna Curtis DOB: December 13, 1930 MRN: [id number]  PRIMARY CARE PROVIDER: Josetta Huddle, MD  RESPONSIBLE PARTY:  Acct ID - Guarantor Home Phone Work Phone Relationship Acct Type  [id number] NASIYA, PASCUAL [phone]  Self P/F     [place], [place], [place] [address]   Covid-19 Pre-screening Negative  PLAN OF CARE and INTERVENTION:  1. ADVANCE CARE PLANNING/GOALS OF CARE: Goal is for patient to figure out what is causing her back pain.  2. PATIENT/CAREGIVER EDUCATION: Explained Palliative care services 3. DISEASE STATUS: Met with patient and her son, Anna Curtis, in patient's home. Patient was discharged from hospice services a month ago for a greater than 6 month prognosis. Upon arrival, patient is sitting up in her chair in the living room eating an oatmeal sandwich. She is able to answer simple questions, but is intermittently confused. Patient can be agitated at times, but son is able to redirect patient. Patient was prescribed Phenobarbital about 3 weeks ago, however patient has never taken this. Son reports that patient has started having issues with lower back pain that is interfering with her mobility. She denied pain at rest, but as soon as she tried to stand up she began holding her lower back, made some grunting noises and sat back down. Son says that patient used to routinely walk around the home constantly, however is now more sedentary. She is taking Advil to help and they are using some [place] Dream Arthritis pain relief cream, which helps some. Son is waiting on the Imaging center to contact him to advise of date/time patient's MRI will be. She requires 1 person assistance with all ADLs, except feeding. Patient is ambulatory. She has a walker but will not utilize this. No recent falls. Her appetite is very good along with good fluid intake. No dysphagia noted or reported. Son is just wanting to find out what is causing her back pain. She just  recently completed antibiotics for a UTI, but continues with lower back pain. He is agreeable to future visits with Palliative care. Will continue to monitor.  HISTORY OF PRESENT ILLNESS: This is a 86 yo female with a diagnosis of Alzheimer's disease. She has a h/o squamous cell carcinoma of anal canal, HTN, and protein calorie malnutrition. Palliative care asked to follow patient for additional support and will visit patient monthly and PRN.  CODE STATUS: DNR ADVANCED DIRECTIVES: Y MOST FORM: no PPS: 40%   (Duration of visit and documentation 45 minutes)   Daryl Eastern, RN BSN

## 2020-02-10 ENCOUNTER — Other Ambulatory Visit: Payer: Self-pay | Admitting: Internal Medicine

## 2020-02-10 DIAGNOSIS — R102 Pelvic and perineal pain: Secondary | ICD-10-CM

## 2020-03-04 ENCOUNTER — Other Ambulatory Visit: Payer: Medicare Other

## 2020-04-01 ENCOUNTER — Telehealth: Payer: Self-pay | Admitting: *Deleted

## 2020-04-01 NOTE — Telephone Encounter (Signed)
Contacted and spoke with patient's son, [name], to schedule a Palliative care home visit. Visit scheduled for tomorrow, 04/02/20 @ 1:00 pm.

## 2020-04-02 ENCOUNTER — Other Ambulatory Visit: Payer: Self-pay

## 2020-04-02 ENCOUNTER — Other Ambulatory Visit: Payer: Medicare Other | Admitting: *Deleted

## 2020-04-02 DIAGNOSIS — Z515 Encounter for palliative care: Secondary | ICD-10-CM

## 2020-04-02 NOTE — Progress Notes (Signed)
COMMUNITY PALLIATIVE CARE RN NOTE  PATIENT NAME: Anna Curtis DOB: 1931/05/21 MRN: [id number]  PRIMARY CARE PROVIDER: Josetta Huddle, MD  RESPONSIBLE PARTY:  Acct ID - Guarantor Home Phone Work Phone Relationship Acct Type  [id number] SILENA, WYSS [phone]  Self P/F     [place], [place], [place] [address]   Covid-19 Pre-screening Negative  PLAN OF CARE and INTERVENTION:  1. ADVANCE CARE PLANNING/GOALS OF CARE: Goal is for patient to remain in her home. She has a DNR. 2. PATIENT/CAREGIVER EDUCATION: Dementia education, s/s of infection 3. DISEASE STATUS: Met with patient and her son [name], in their home. Patient went to lie down in her bed for an afternoon nap. No physical indicators of pain noted. [name] reports that patient has not c/o back pain in "quite a while." She was supposed to have a MRI last month d/t back pain, but this was never done because her back pain improved. Family did not want to put her throught the process of a MRI if it's not necessary. She is able to answer simple questions. She is confused. Her mood is usually pleasant. She does become fixated on some things, but is able to be redirected. Patient walks around her home constantly. She does not utilize a walker. They lock the front door with a key, so patient will not wander outside. No recent falls. She has a very good appetite. [name] says that she is always eating. No difficulties swallowing. She does not take any medications, other than Imodium for loose stools. She takes 1-2 Imodium tablets every 1-2 days. She requires assistance with bathing. She is able to dress herself. She is able to toilet herself most times, but does have some occasional urinary and bladder incontinence that requires family assistance. She wears Depends. Overall, her son feels that her condition is stable. Will continue to monitor.   HISTORY OF PRESENT ILLNESS: This is a 86 yo female with a diagnosis of Alzheimer's disease. She has a h/o squamous  cell carcinoma of anal canal, HTN, and protein calorie malnutrition. Palliative care continues to follow patient and visits monthly and PRN.    CODE STATUS: DNR ADVANCED DIRECTIVES: Y MOST FORM: no PPS: 50%   (Duration of visit and documentation 45 minutes)   Daryl Eastern, RN BSN

## 2020-04-23 ENCOUNTER — Other Ambulatory Visit: Payer: Self-pay

## 2020-04-23 ENCOUNTER — Other Ambulatory Visit: Payer: Medicare Other | Admitting: *Deleted

## 2020-04-23 DIAGNOSIS — Z515 Encounter for palliative care: Secondary | ICD-10-CM

## 2020-04-27 NOTE — Progress Notes (Signed)
COMMUNITY PALLIATIVE CARE RN NOTE  PATIENT NAME: Anna Curtis DOB: 1931-06-09 MRN: [id number]  PRIMARY CARE PROVIDER: Josetta Huddle, MD  RESPONSIBLE PARTY:  Acct ID - Guarantor Home Phone Work Phone Relationship Acct Type  [id number] DALETH, IMMEL [phone]  Self P/F     [place], [place], [place] [address]   Due to the COVID-19 crisis, this virtual check-in visit was done via telephone from my office and it was initiated and consent by this patient and or family.  PLAN OF CARE and INTERVENTION:  1. ADVANCE CARE PLANNING/GOALS OF CARE: Goal is for patient to remain in her home. She has a DNR. 2. PATIENT/CAREGIVER EDUCATION: Symptom management, safe mobility 3. DISEASE STATUS: Virtual check-in visit completed via telephone. Patient is not reporting any pain or no indicators noted per son. She remains confused, but is able to make some needs known and answer some simple questions. No breathing difficulties reported. She is ambulatory without the use of assistive devices. She does require 1 person assistance with bathing and occasionally with toileting. She is able to dress herself independently. She is intermittently incontinent of both bowel and bladder and wears Depends. She does continue to walk around her home endlessly throughout the day. The door remains locked from the inside via key in order to keep her from wandering outside. Her appetite remains good. She eats/snacks throughout much of the day. Her weight has been stable recently. No issues with swallowing noticed. Will continue to monitor.  HISTORY OF PRESENT ILLNESS: This is a 86 yo female with a diagnosis of Alzheimer's disease. She has a h/o squamous cell carcinoma of anal canal, HTN, and protein calorie malnutrition. Palliative care continues to follow patient and visits monthly and PRN.     CODE STATUS: DNR ADVANCED DIRECTIVES: Y MOST FORM: no PPS: 50%   (Duration of visit and documentation 30 minutes)   Daryl Eastern, RN BSN

## 2020-06-02 ENCOUNTER — Other Ambulatory Visit: Payer: Medicare Other | Admitting: *Deleted

## 2020-06-02 ENCOUNTER — Other Ambulatory Visit: Payer: Self-pay

## 2020-06-02 DIAGNOSIS — Z515 Encounter for palliative care: Secondary | ICD-10-CM

## 2020-06-03 NOTE — Progress Notes (Signed)
COMMUNITY PALLIATIVE CARE RN NOTE  PATIENT NAME: Anna Curtis DOB: Aug 18, 1931 MRN: [id number]  PRIMARY CARE PROVIDER: Josetta Huddle, MD  RESPONSIBLE PARTY:  Acct ID - Guarantor Home Phone Work Phone Relationship Acct Type  [id number] DANYEAL, AKENS [phone]  Self P/F     [place], [place], [place] [address]   Due to the COVID-19 crisis, this virtual check-in visit was done via telephone from my office and it was initiated and consent by this patient and or family.  PLAN OF CARE and INTERVENTION:  1. ADVANCE CARE PLANNING/GOALS OF CARE: Goal is for patient to remain in her home with her son.  2. PATIENT/CAREGIVER EDUCATION: N/A 3. DISEASE STATUS: Virtual check-in visit completed via telephone. Patient is not experiencing any issues with pain or shortness of breath. She remains intermittently confused, but able to make needs known. She remains ambulatory without the use of any assistive devices. No recent falls. They continue to lock the front doors because she is an elopement risk. She has a very good appetite and snacks almost all day between meals. Weight is stable at this time. She walks on and off throughout the house all day while she is awake. She is sleeping well during the night. Intermittent issues with incontinence and wears Depends. Family continues to assist with toileting and showers when she allows. She is able to dress herself. No skin issues. Son feels that patient is doing well at this time. Will continue to monitor.   HISTORY OF PRESENT ILLNESS: This is a 86 yo female with a diagnosis of Alzheimer's disease. She has a h/o squamous cell carcinoma of anal canal, HTN, and protein calorie malnutrition. Palliative carecontinues to follow patient andvisitsmonthly and PRN.     CODE STATUS: DNR ADVANCED DIRECTIVES: Y MOST FORM: no PPS: 50%   (Duration of visit and documentation 30 minutes)   Daryl Eastern, RN BSN

## 2020-07-01 ENCOUNTER — Telehealth: Payer: Self-pay

## 2020-07-02 ENCOUNTER — Telehealth: Payer: Self-pay

## 2020-07-02 NOTE — Telephone Encounter (Signed)
Message received to call patient's son, [name]. Spoke with son [name] who shared that patient is c/o low back pain with hematuria. Patient had experienced this about 8 months ago and was treated with Amoxicillin. Per [name], this was effective in easing symptoms. Spoke with Stanton Kidney NP who directed this RN to call in Amoxicillin 500 mg q8hours x 7 days to preferred pharmacy. Medication called to [facility] on [address]. Son made aware

## 2020-07-02 NOTE — Telephone Encounter (Signed)
Received message that son had called to report that medication for UTI is not at pharmacy. Phone call placed to [facility] to follow up. Per order of Violeta Gelinas NP, order for Amoxicillin 500mg  1 q 8hours x 7 days #21 with no refills called into pharmacy. Message on Pharmacy VM

## 2020-07-06 ENCOUNTER — Telehealth: Payer: Self-pay | Admitting: *Deleted

## 2020-07-06 NOTE — Telephone Encounter (Signed)
Received a message stating that patient's son, [name], called and is requesting a return call.   Called and spoke with [name] who says that patient has been having issues with walking for the past few days. She has been experiencing pain with movement/ambulation causing her gait to be more unsteady, as patient likes to walk aorund the house. Pain seems to be ok when she is lying down. He recently noticed a lump on her right leg near her hip area, but is unsure of the cause. They have been trying to keep her in bed and she has been able to stand and utilize the bedside commode. I recommended that he contact her PCP and request an x-ray of this area, as this is the only way to know for sure what the issue is. [name] verbalized understanding and advised he will contact her PCP and keep Palliative care updated.

## 2020-07-07 ENCOUNTER — Other Ambulatory Visit: Payer: Self-pay | Admitting: Internal Medicine

## 2020-07-07 ENCOUNTER — Encounter (HOSPITAL_COMMUNITY): Payer: Self-pay

## 2020-07-07 ENCOUNTER — Ambulatory Visit
Admission: RE | Admit: 2020-07-07 | Discharge: 2020-07-07 | Disposition: A | Discharge status: Home / Self Care | Payer: Medicare Other | Source: Ambulatory Visit | Attending: Internal Medicine | Admitting: Internal Medicine

## 2020-07-07 ENCOUNTER — Emergency Department (HOSPITAL_COMMUNITY): Payer: Medicare Other

## 2020-07-07 ENCOUNTER — Other Ambulatory Visit: Payer: Self-pay

## 2020-07-07 ENCOUNTER — Inpatient Hospital Stay (HOSPITAL_COMMUNITY)
Admission: EM | Admit: 2020-07-07 | Discharge: 2020-07-13 | DRG: 522 | Disposition: A | Discharge status: Home Health | Payer: Medicare Other | Attending: Orthopedic Surgery | Admitting: Orthopedic Surgery

## 2020-07-07 DIAGNOSIS — Z20822 Contact with and (suspected) exposure to covid-19: Secondary | ICD-10-CM | POA: Diagnosis not present

## 2020-07-07 DIAGNOSIS — G309 Alzheimer's disease, unspecified: Secondary | ICD-10-CM | POA: Diagnosis not present

## 2020-07-07 DIAGNOSIS — E871 Hypo-osmolality and hyponatremia: Secondary | ICD-10-CM | POA: Diagnosis present

## 2020-07-07 DIAGNOSIS — M25551 Pain in right hip: Secondary | ICD-10-CM | POA: Diagnosis not present

## 2020-07-07 DIAGNOSIS — Z66 Do not resuscitate: Secondary | ICD-10-CM | POA: Diagnosis not present

## 2020-07-07 DIAGNOSIS — R4182 Altered mental status, unspecified: Secondary | ICD-10-CM

## 2020-07-07 DIAGNOSIS — F0280 Dementia in other diseases classified elsewhere without behavioral disturbance: Secondary | ICD-10-CM | POA: Diagnosis present

## 2020-07-07 DIAGNOSIS — N390 Urinary tract infection, site not specified: Secondary | ICD-10-CM | POA: Diagnosis present

## 2020-07-07 DIAGNOSIS — F0281 Dementia in other diseases classified elsewhere with behavioral disturbance: Secondary | ICD-10-CM | POA: Diagnosis present

## 2020-07-07 DIAGNOSIS — I10 Essential (primary) hypertension: Secondary | ICD-10-CM | POA: Diagnosis not present

## 2020-07-07 DIAGNOSIS — Z681 Body mass index (BMI) 19 or less, adult: Secondary | ICD-10-CM

## 2020-07-07 DIAGNOSIS — E43 Unspecified severe protein-calorie malnutrition: Secondary | ICD-10-CM | POA: Diagnosis present

## 2020-07-07 DIAGNOSIS — E441 Mild protein-calorie malnutrition: Secondary | ICD-10-CM | POA: Diagnosis present

## 2020-07-07 DIAGNOSIS — Z515 Encounter for palliative care: Secondary | ICD-10-CM | POA: Diagnosis present

## 2020-07-07 DIAGNOSIS — Z853 Personal history of malignant neoplasm of breast: Secondary | ICD-10-CM | POA: Diagnosis not present

## 2020-07-07 DIAGNOSIS — Y92009 Unspecified place in unspecified non-institutional (private) residence as the place of occurrence of the external cause: Secondary | ICD-10-CM

## 2020-07-07 DIAGNOSIS — W19XXXA Unspecified fall, initial encounter: Secondary | ICD-10-CM | POA: Diagnosis present

## 2020-07-07 DIAGNOSIS — Z87891 Personal history of nicotine dependence: Secondary | ICD-10-CM

## 2020-07-07 DIAGNOSIS — R410 Disorientation, unspecified: Secondary | ICD-10-CM | POA: Diagnosis not present

## 2020-07-07 DIAGNOSIS — Z8673 Personal history of transient ischemic attack (TIA), and cerebral infarction without residual deficits: Secondary | ICD-10-CM

## 2020-07-07 DIAGNOSIS — R2681 Unsteadiness on feet: Secondary | ICD-10-CM | POA: Diagnosis not present

## 2020-07-07 DIAGNOSIS — Z471 Aftercare following joint replacement surgery: Secondary | ICD-10-CM | POA: Diagnosis not present

## 2020-07-07 DIAGNOSIS — S72001A Fracture of unspecified part of neck of right femur, initial encounter for closed fracture: Secondary | ICD-10-CM | POA: Diagnosis not present

## 2020-07-07 DIAGNOSIS — Z96649 Presence of unspecified artificial hip joint: Secondary | ICD-10-CM

## 2020-07-07 DIAGNOSIS — Z96641 Presence of right artificial hip joint: Secondary | ICD-10-CM | POA: Diagnosis not present

## 2020-07-07 DIAGNOSIS — E876 Hypokalemia: Secondary | ICD-10-CM | POA: Diagnosis not present

## 2020-07-07 LAB — URINALYSIS, ROUTINE W REFLEX MICROSCOPIC
Bilirubin Urine: NEGATIVE
Glucose, UA: NEGATIVE mg/dL
Ketones, ur: NEGATIVE mg/dL
Nitrite: NEGATIVE
Protein, ur: NEGATIVE mg/dL
Specific Gravity, Urine: 1.011 (ref 1.005–1.030)
pH: 5 (ref 5.0–8.0)

## 2020-07-07 LAB — CBC WITH DIFFERENTIAL/PLATELET
Abs Immature Granulocytes: 0.02 10*3/uL (ref 0.00–0.07)
Basophils Absolute: 0.1 10*3/uL (ref 0.0–0.1)
Basophils Relative: 1 %
Eosinophils Absolute: 0.2 10*3/uL (ref 0.0–0.5)
Eosinophils Relative: 2 %
HCT: 33.4 % — ABNORMAL LOW (ref 36.0–46.0)
Hemoglobin: 11 g/dL — ABNORMAL LOW (ref 12.0–15.0)
Immature Granulocytes: 0 %
Lymphocytes Relative: 12 %
Lymphs Abs: 0.8 10*3/uL (ref 0.7–4.0)
MCH: 31.3 pg (ref 26.0–34.0)
MCHC: 32.9 g/dL (ref 30.0–36.0)
MCV: 95.2 fL (ref 80.0–100.0)
Monocytes Absolute: 0.7 10*3/uL (ref 0.1–1.0)
Monocytes Relative: 10 %
Neutro Abs: 5.4 10*3/uL (ref 1.7–7.7)
Neutrophils Relative %: 75 %
Platelets: 388 10*3/uL (ref 150–400)
RBC: 3.51 MIL/uL — ABNORMAL LOW (ref 3.87–5.11)
RDW: 14.4 % (ref 11.5–15.5)
WBC: 7.1 10*3/uL (ref 4.0–10.5)
nRBC: 0 % (ref 0.0–0.2)

## 2020-07-07 LAB — BASIC METABOLIC PANEL
Anion gap: 10 (ref 5–15)
BUN: 17 mg/dL (ref 8–23)
CO2: 24 mmol/L (ref 22–32)
Calcium: 8.5 mg/dL — ABNORMAL LOW (ref 8.9–10.3)
Chloride: 104 mmol/L (ref 98–111)
Creatinine, Ser: 0.8 mg/dL (ref 0.44–1.00)
GFR calc Af Amer: >60 mL/min (ref >60)
GFR calc non Af Amer: >60 mL/min (ref >60)
Glucose, Bld: 95 mg/dL (ref 70–99)
Potassium: 4.5 mmol/L (ref 3.5–5.1)
Sodium: 138 mmol/L (ref 135–145)

## 2020-07-07 LAB — PROTIME-INR
INR: 1 (ref 0.8–1.2)
Prothrombin Time: 12.4 seconds (ref 11.4–15.2)

## 2020-07-07 LAB — SARS CORONAVIRUS 2 BY RT PCR (HOSPITAL ORDER, PERFORMED IN ~~LOC~~ HOSPITAL LAB): SARS Coronavirus 2: NEGATIVE

## 2020-07-07 MED ORDER — MORPHINE SULFATE (PF) 2 MG/ML IV SOLN
0.5000 mg | INTRAVENOUS | Status: DC | PRN
  Administered 2020-07-08 – 2020-07-09 (×8): 0.5 mg via INTRAVENOUS
  Filled 2020-07-07 (×8): qty 1

## 2020-07-07 MED ORDER — FENTANYL CITRATE (PF) 100 MCG/2ML IJ SOLN
50.0000 ug | Freq: Once | INTRAMUSCULAR | Status: AC
  Administered 2020-07-07: 50 ug via INTRAVENOUS
  Filled 2020-07-07: qty 2

## 2020-07-07 MED ORDER — MORPHINE SULFATE (PF) 4 MG/ML IV SOLN
4.0000 mg | Freq: Once | INTRAVENOUS | Status: AC
  Administered 2020-07-07: 4 mg via INTRAVENOUS
  Filled 2020-07-07: qty 1

## 2020-07-07 MED ORDER — ONDANSETRON HCL 4 MG/2ML IJ SOLN
4.0000 mg | Freq: Once | INTRAMUSCULAR | Status: DC
  Filled 2020-07-07: qty 2

## 2020-07-07 MED ORDER — SENNA 8.6 MG PO TABS
1.0000 | ORAL_TABLET | Freq: Two times a day (BID) | ORAL | Status: DC
  Administered 2020-07-08 – 2020-07-12 (×4): 8.6 mg via ORAL
  Filled 2020-07-07 (×4): qty 1

## 2020-07-07 MED ORDER — HYDROCODONE-ACETAMINOPHEN 5-325 MG PO TABS
1.0000 | ORAL_TABLET | Freq: Four times a day (QID) | ORAL | Status: DC
  Filled 2020-07-07 (×4): qty 1

## 2020-07-07 MED ORDER — HYDROCODONE-ACETAMINOPHEN 5-325 MG PO TABS: 1.0000 | ORAL_TABLET | Freq: Four times a day (QID) | ORAL | Status: DC | PRN

## 2020-07-07 MED ORDER — SODIUM CHLORIDE 0.9 % IV BOLUS
500.0000 mL | Freq: Once | INTRAVENOUS | Status: AC
  Administered 2020-07-07: 500 mL via INTRAVENOUS

## 2020-07-07 NOTE — ED Provider Notes (Signed)
[facility] DEPT Provider Note   CSN: [id number] Arrival date & time: 07/07/20  1905     History Chief Complaint  Patient presents with  . Hip Pain    Anna Curtis is a 86 y.o. female with history of alzheimer's disease, HTN brought to ER for x-ray that showed right hip fracture ordered by PCP.  Level 5 caveat due to dementia.  Patient is screaming in pain holding her right leg flexed at the hip.  Daughter at bedside states patient lives with son and grandson. Patient started complaining of pain in her leg on Wednesday of last week. Palliative care nurse ordered amoxicillin for concern of UTI because patient's son also noted blood in urine.  Patient has had worsening pain in right leg and difficulty walking.  Yesterday palliative care recommended PCP follow up and x-rays were done of hip today.  Daughter states patient is usually mildly confused and agitated but her agitation today is much worse, attributes it to the pain.  Patient does not take any medicines.  She ambulates on her own and can do simple ADLs independently.  Family unsure of any recent falls but daughter states patient tends to get confused at night and get out of bed to walk around. No fevers. No cough. No vomiting or diarrhea. Daughter unsure about urinary symptoms. Daughter's husband is patient's POA.   HPI     Past Medical History:  Diagnosis Date  . Bladder prolapse, female, acquired   . Cancer ([place])    breast 2002 LEFT RADIATION AND SX DONE  . Dementia ([place])   . Hypertension    OFF BP MEDS X 2 YEARS  . TIA (transient ischemic attack) 2014    Patient Active Problem List   Diagnosis Date Noted  . Hospice care patient 10/25/2019  . Palliative care patient 10/25/2019  . Failure to thrive in adult 10/24/2019  . Severe protein-calorie malnutrition ([place]) 10/24/2019  . Decreased oral intake 10/24/2019  . Fall at home, initial encounter 10/23/2019  . Hypokalemia 10/23/2019  . Acute  metabolic encephalopathy 95/63/8756  . Pressure injury of skin 10/23/2019  . Pain due to onychomycosis of toenails of both feet 10/15/2019  . Alzheimer's disease ([place]) 08/09/2019  . Agitation 08/09/2019  . Squamous cell carcinoma of anal canal ([place]) 08/08/2019  . Ataxia 06/08/2013  . Diplopia 06/08/2013  . Dizziness 06/08/2013  . Sinusitis 06/08/2013  . HTN (hypertension) 06/08/2013  . Tobacco abuse 06/08/2013    Past Surgical History:  Procedure Laterality Date  . ABDOMINAL HYSTERECTOMY     2007  . BLADDER TACH    . BREAST SURGERY Left 2002   CANCER LUMPECTOMY     OB History    Gravida  0   Para  0   Term  0   Preterm  0   AB  0   Living        SAB  0   TAB  0   Ectopic  0   Multiple      Live Births              Family History  Problem Relation Age of Onset  . Cancer Mother   . Cancer Brother     Social History   Tobacco Use  . Smoking status: Former Smoker    Packs/day: 1.00    Years: 40.00    Pack years: 40.00    Types: Cigarettes  . Smokeless tobacco: Never Used  . Tobacco comment:  QUIT AGE 38  Vaping Use  . Vaping Use: Never used  Substance Use Topics  . Alcohol use: Yes    Alcohol/week: 1.0 standard drink    Types: 1 Glasses of wine per week    Comment: every night 1 OR 2 GLASSES WINE  . Drug use: No    Home Medications Prior to Admission medications   Medication Sig Start Date End Date Taking? Authorizing Provider  acetaminophen (TYLENOL) 160 MG/5ML liquid Take 15.6 mLs (500 mg total) by mouth every 4 (four) hours as needed for fever. Patient not taking: Reported on 07/07/2020 10/25/19   Vicenta Dunning, MD  Morphine Sulfate (MORPHINE CONCENTRATE) 10 MG/0.5ML SOLN concentrated solution Take 0.25 mLs (5 mg total) by mouth every 6 (six) hours as needed for moderate pain or severe pain. Patient not taking: Reported on 07/07/2020 10/25/19   Vicenta Dunning, MD    Allergies    Patient has no known allergies.  Review of Systems     Review of Systems  Musculoskeletal: Positive for arthralgias and gait problem.  All other systems reviewed and are negative.   Physical Exam Updated Vital Signs BP (!) 174/66 (BP Location: Right Arm)   Pulse 72   Temp (!) 97.5 F (36.4 C) (Oral)   Resp 16   SpO2 100%   Physical Exam Vitals and nursing note reviewed.  Constitutional:      General: She is not in acute distress.    Appearance: She is well-developed.     Comments: Disheveled.  Agitated. Holding right leg in flexed position. Swaying back and forth   HENT:     Head: Normocephalic and atraumatic.     Comments: No evidence of trauma facial/skull bones     Right Ear: External ear normal.     Left Ear: External ear normal.     Nose: Nose normal.  Eyes:     Conjunctiva/sclera: Conjunctivae normal.  Cardiovascular:     Rate and Rhythm: Normal rate and regular rhythm.     Heart sounds: Normal heart sounds.     Comments: 1+ DP and PT pulses bilaterally  Pulmonary:     Effort: Pulmonary effort is normal.     Breath sounds: Normal breath sounds.  Musculoskeletal:        General: No deformity. Normal range of motion.     Cervical back: Normal range of motion and neck supple.     Comments: Exam limited due to patient's agitation. Pain with any right hip movement, attempt to extend.  patient holding her right hip/leg flexed, swaying back and forth.  No focal bony tenderness at right knee, ankle.  Spontaneously moving other extremities.   Skin:    General: Skin is warm and dry.     Capillary Refill: Capillary refill takes less than 2 seconds.  Neurological:     Mental Status: She is alert and oriented to person, place, and time.     Comments: Patient able to tell me what foot I am touching.  Strength exam limited due to patient confusion, unable to follow commands but spontaneously moving all extremities   Psychiatric:        Behavior: Behavior normal.        Thought Content: Thought content normal.        Judgment:  Judgment normal.     ED Results / Procedures / Treatments   Labs (all labs ordered are listed, but only abnormal results are displayed) Labs Reviewed  CBC WITH DIFFERENTIAL/PLATELET -  Abnormal; Notable for the following components:      Result Value   RBC 3.51 (*)    Hemoglobin 11.0 (*)    HCT 33.4 (*)    All other components within normal limits  BASIC METABOLIC PANEL - Abnormal; Notable for the following components:   Calcium 8.5 (*)    All other components within normal limits  URINALYSIS, ROUTINE W REFLEX MICROSCOPIC - Abnormal; Notable for the following components:   Hgb urine dipstick MODERATE (*)    Leukocytes,Ua MODERATE (*)    Bacteria, UA RARE (*)    All other components within normal limits  SARS CORONAVIRUS 2 BY RT PCR (HOSPITAL ORDER, [facility])  URINE CULTURE  PROTIME-INR  TYPE AND SCREEN    EKG EKG Interpretation  Date/Time:  Tuesday July 07 2020 20:50:55 EDT Ventricular Rate:  66 PR Interval:    QRS Duration: 143 QT Interval:  481 QTC Calculation: 504 R Axis:   82 Text Interpretation: Sinus rhythm IVCD, consider atypical RBBB No old tracing to compare Confirmed by Malvin Johns [id number]) on 07/07/2020 8:59:46 PM   Radiology DG HIP UNILAT WITH PELVIS 2-3 VIEWS RIGHT  Result Date: 07/07/2020 CLINICAL DATA:  Right hip pain for 5 days. EXAM: DG HIP (WITH OR WITHOUT PELVIS) 2-3V RIGHT COMPARISON:  None. FINDINGS: There is a right femoral neck fracture with impaction. No subluxation or dislocation. Vascular calcifications noted. IMPRESSION: Right femoral neck fracture with impaction. These results will be called to the ordering clinician or representative by the Radiology Department at the imaging location. Electronically Signed   By: Rolm Baptise M.D.   On: 07/07/2020 16:48    Procedures Procedures (including critical care time)  Medications Ordered in ED Medications  ondansetron [facility]) injection 4 mg (0 mg Intravenous Hold  07/07/20 2013)  fentaNYL (SUBLIMAZE) injection 50 mcg (50 mcg Intravenous Given 07/07/20 1959)  sodium chloride 0.9 % bolus 500 mL (500 mLs Intravenous New Bag/Given 07/07/20 2012)    ED Course  I have reviewed the triage vital signs and the nursing notes.  Pertinent labs & imaging results that were available during my care of the patient were reviewed by me and considered in my medical decision making (see chart for details).  Clinical Course as of Jul 07 2121  Tue Jul 07, 2020  2116 [facility] Guest): MODERATE [CG]  2116 WBC, UA: 0-5 [CG]  2116 Bacteria, UA(!): RARE [CG]  2116 Squamous Epithelial / LPF: 6-10 [CG]    Clinical Course User Index [CG] Kinnie Feil, PA-C   MDM Rules/Calculators/A&P                          X-ray from outside hospital personally visualized show right femoral neck fracture. No dislocation.    Extremity grossly neurovascularly although initial exam limited due to agitation/pain.   Hip fracture order set utilized. EKG, COVID ordered. Shared with EDP.   Fentanyl, zofran, small IVF ordered.   ER work up personally reviewed and interpreted. Mild anemia at baseline. UA not very convincing for infection, will send culture. Unable to get review of symptoms to corroborate for UTI.   2115: Patient re-evaluated, daughter at bedside states patient's agitation has improved but screams when monitor beeps and BP cuff inflates.  Will repeat one more set of VS and dc cont cardiac and pulse ox monitoring for patient comfort.  Pending ortho consult and medicine admit.   2120: Spoke with Dr Lucia Gaskins, wants NPO  tonight will see determine POC tomorrow morning. Admit to medicine.   Final Clinical Impression(s) / ED Diagnoses Final diagnoses:  Closed fracture of right hip, initial encounter [facility])    Rx / [place] Orders ED Discharge Orders    None       Arlean Hopping 07/07/20 2123    Malvin Johns, MD 07/07/20 2150

## 2020-07-07 NOTE — ED Notes (Signed)
We would like to make sure there is a sitter available prior to accepting this patient. Thank you.

## 2020-07-07 NOTE — ED Triage Notes (Signed)
Patient arrived with family who states Wednesday patient appeared to be in pain. States her PCP took an xray today and found her to have a broken right hip, per family patient ambulates and is unsure about any falls or injury.

## 2020-07-07 NOTE — H&P (Signed)
History and Physical    SHARETTA RICCHIO ESP:[id number] DOB: 05-Oct-1931 DOA: 07/07/2020  PCP: Josetta Huddle, MD  Patient coming from: Home, daughter at bedside  I have personally briefly reviewed patient's old medical records in [place]  Chief Complaint: Right femoral neck fracture  HPI: COURTLYN AKI is a 86 y.o. 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  Patient lives with her son and last week he noticed that she has been having more unsteady gait and pain to her right side.  Patient is followed by palliative care monthly and they thought that perhaps patient had a UTI initially prescribed her antibiotics last week.  However she continues to have unsteady gait and was able to ambulate freely before without assistance.  They presented to her primary care physician today and had x-ray showing right femoral neck fracture with impaction.  Son is not sure whether patient got up out of bed on her own in the morning and fell.  At baseline, patient is oriented to self and sometimes family member.  She is able to ambulate freely and able to feed herself.  Although needs help with cooking and bathing.  ED Course: Patient was afebrile, mildly hypertensive on room air.  Labs mostly unremarkable and stable from prior.  Orthopedic surgeon was consulted by ED PA and recommended that she be kept n.p.o. and had ordered a CT of the hip.  However patient is restless and according to daughter at bedside off from her baseline.  She remains quite restless despite 4 mg of morphine and 50 mcg of fentanyl and requires frequent redirection.  Review of Systems:  Unable to obtain due to patient's dementia  Past Medical History:  Diagnosis Date  . Bladder prolapse, female, acquired   . Cancer ([place])    breast 2002 LEFT RADIATION AND SX DONE    . Dementia ([place])   . Hypertension    OFF BP MEDS X 2 YEARS  . TIA (transient ischemic attack) 2014    Past Surgical History:  Procedure Laterality Date  . ABDOMINAL HYSTERECTOMY     2007  . BLADDER TACH    . BREAST SURGERY Left 2002   CANCER LUMPECTOMY     reports that she has quit smoking. Her smoking use included cigarettes. She has a 40.00 pack-year smoking history. She has never used smokeless tobacco. She reports current alcohol use of about 1.0 standard drink of alcohol per week. She reports that she does not use drugs. Social History  No Known Allergies  Family History  Problem Relation Age of Onset  . Cancer Mother   . Cancer Brother      Prior to Admission medications   Medication Sig Start Date End Date Taking? Authorizing Provider  acetaminophen (TYLENOL) 160 MG/5ML liquid Take 15.6 mLs (500 mg total) by mouth every 4 (four) hours as needed for fever. Patient not taking: Reported on 07/07/2020 10/25/19   Vicenta Dunning, MD  Morphine Sulfate (MORPHINE CONCENTRATE) 10 MG/0.5ML SOLN concentrated solution Take 0.25 mLs (5 mg total) by mouth every 6 (six) hours as needed for moderate pain or severe pain. Patient not taking: Reported on 07/07/2020 10/25/19   Vicenta Dunning, MD    Physical Exam: Vitals:   07/07/20 2203 07/07/20 2207 07/07/20 2208 07/07/20 2209  BP:      Pulse: 76 76 70 72  Resp:      Temp:      TempSrc:      SpO2: 100% 100% 100% 100%    Constitutional: NAD, restless thin elderly female laying flat in bed Vitals:   07/07/20 2203 07/07/20 2207 07/07/20 2208 07/07/20 2209  BP:      Pulse: 76 76 70 72  Resp:      Temp:      TempSrc:      SpO2: 100% 100% 100% 100%   Eyes: PERRL, lids and conjunctivae normal ENMT: Mucous membranes are moist.  Neck: normal, supple Respiratory: clear to auscultation bilaterally, no wheezing, no crackles. Normal respiratory effort.  Cardiovascular: Regular rate and rhythm, no murmurs / rubs / gallops. No extremity  edema. 2+ pedal pulses..  Abdomen: no tenderness, no masses palpated.  Bowel sounds positive.  Musculoskeletal: no clubbing / cyanosis. No joint deformity upper and lower extremities.  Has pain with palpation of the right hip area but difficult to do exam given patient's dementia and patient is restless and uncooperative.   Skin: no rashes, lesions, ulcers. No induration Neurologic: Patient alert and oriented to self and daughter at bedside.  She is able to move all extremities and would grab at items around the bed but unable to answer much questions or follow commands. Psychiatric: Alert and oriented to self and daughter at bedside.    Labs on Admission: I have personally reviewed following labs and imaging studies  CBC: Recent Labs  Lab 07/07/20 2006  WBC 7.1  NEUTROABS 5.4  HGB 11.0*  HCT 33.4*  MCV 95.2  PLT 433   Basic Metabolic Panel: Recent Labs  Lab 07/07/20 2006  NA 138  K 4.5  CL 104  CO2 24  GLUCOSE 95  BUN 17  CREATININE 0.80  CALCIUM 8.5*   GFR: CrCl cannot be calculated (Unknown ideal weight.). Liver Function Tests: No results for input(s): AST, ALT, ALKPHOS, BILITOT, PROT, ALBUMIN in the last 168 hours. No results for input(s): LIPASE, AMYLASE in the last 168 hours. No results for input(s): AMMONIA in the last 168 hours. Coagulation Profile: Recent Labs  Lab 07/07/20 2006  INR 1.0   Cardiac Enzymes: No results for input(s): CKTOTAL, CKMB, CKMBINDEX, TROPONINI in the last 168 hours. BNP (last 3 results) No results for input(s): PROBNP in the last 8760 hours. HbA1C: No results for input(s): HGBA1C in the last 72 hours. CBG: No results for input(s): GLUCAP in the last 168 hours. Lipid Profile: No results for input(s): CHOL, HDL, LDLCALC, TRIG, CHOLHDL, LDLDIRECT in the last 72 hours. Thyroid Function Tests: No results for input(s): TSH, T4TOTAL, FREET4, T3FREE, THYROIDAB in the last 72 hours. Anemia Panel: No results for input(s): VITAMINB12,  FOLATE, FERRITIN, TIBC, IRON, RETICCTPCT in the last 72 hours. Urine analysis:    Component Value Date/Time   COLORURINE YELLOW 07/07/2020 1936   APPEARANCEUR CLEAR 07/07/2020 1936   LABSPEC 1.011 07/07/2020 1936   PHURINE 5.0 07/07/2020 1936   GLUCOSEU NEGATIVE 07/07/2020 1936   HGBUR MODERATE (A) 07/07/2020 1936   BILIRUBINUR NEGATIVE 07/07/2020 1936   KETONESUR NEGATIVE 07/07/2020 1936   PROTEINUR NEGATIVE 07/07/2020 1936   UROBILINOGEN 0.2 08/12/2015 1612   NITRITE NEGATIVE 07/07/2020 1936   LEUKOCYTESUR MODERATE (A) 07/07/2020 1936    Radiological Exams on Admission: DG HIP UNILAT WITH PELVIS 2-3 VIEWS RIGHT  Result Date: 07/07/2020 CLINICAL DATA:  Right hip pain for 5 days. EXAM: DG HIP (WITH OR WITHOUT PELVIS) 2-3V RIGHT COMPARISON:  None. FINDINGS: There is  a right femoral neck fracture with impaction. No subluxation or dislocation. Vascular calcifications noted. IMPRESSION: Right femoral neck fracture with impaction. These results will be called to the ordering clinician or representative by the Radiology Department at the imaging location. Electronically Signed   By: Rolm Baptise M.D.   On: 07/07/2020 16:48      Assessment/Plan  Right femoral neck fracture Scheduled hydrocodone for moderate pain, PRN IV morphine for severe pain Orthopedic surgeon would like CT of the hip however patient is restless and agitated despite pain medication.  She is more confused from her baseline dementia and I believe further sedation meds will cause more altered mental status and the risk outweighs benefit at this time. Will continue to do schedule pain management overnight and reassess patient's ability to be able to lay still for CT of the hip tomorrow morning  Altered mental status Likely secondary to pain Will do scheduled pain control as pt not unable to ask for them herself Have 1 to 1 sitter for redirection Bed alarm for fall risk   HTN  elevated but likely due to pain    Alzheimer's dementia Patient baseline is oriented to self, sometimes family.  At home needs help with most ADLs.  At baseline able to ambulate with assistance.  Protein calorie malnutrition Consult dietitian   DVT prophylaxis:SCD Code Status: DNR- confirmed with daughter Family Communication: Plan discussed with daughter at bedside  disposition Plan: Home with at least 2 midnight stays  Consults called:  Admission status: inpatient  Status is: Inpatient  Remains inpatient appropriate because:Inpatient level of care appropriate due to severity of illness   Dispo: The patient is from: Home              Anticipated d/c is to: tbd              Anticipated d/c date is: 2 days              Patient currently is not medically stable to d/c.         Orene Desanctis DO [facility]   If 7PM-7AM, please contact night-coverage [email or web address]   07/07/2020, 10:21 PM

## 2020-07-07 NOTE — ED Notes (Signed)
Informed by staff sitter is on her way to the floor 3w

## 2020-07-08 ENCOUNTER — Inpatient Hospital Stay (HOSPITAL_COMMUNITY): Payer: Medicare Other

## 2020-07-08 ENCOUNTER — Encounter (HOSPITAL_COMMUNITY): Payer: Self-pay | Admitting: Family Medicine

## 2020-07-08 ENCOUNTER — Inpatient Hospital Stay (HOSPITAL_COMMUNITY): Payer: Medicare Other | Admitting: Certified Registered Nurse Anesthetist

## 2020-07-08 ENCOUNTER — Encounter (HOSPITAL_COMMUNITY)
Admission: EM | Disposition: A | Discharge status: Home Health | Payer: Self-pay | Source: Home / Self Care | Attending: Orthopedic Surgery

## 2020-07-08 DIAGNOSIS — Z96649 Presence of unspecified artificial hip joint: Secondary | ICD-10-CM

## 2020-07-08 DIAGNOSIS — F0280 Dementia in other diseases classified elsewhere without behavioral disturbance: Secondary | ICD-10-CM

## 2020-07-08 DIAGNOSIS — S72001A Fracture of unspecified part of neck of right femur, initial encounter for closed fracture: Secondary | ICD-10-CM | POA: Diagnosis not present

## 2020-07-08 DIAGNOSIS — E876 Hypokalemia: Secondary | ICD-10-CM | POA: Diagnosis not present

## 2020-07-08 DIAGNOSIS — I10 Essential (primary) hypertension: Secondary | ICD-10-CM | POA: Diagnosis not present

## 2020-07-08 HISTORY — PX: HIP ARTHROPLASTY: SHX981

## 2020-07-08 LAB — TYPE AND SCREEN
ABO/RH(D): A POS
Antibody Screen: NEGATIVE

## 2020-07-08 LAB — URINE CULTURE

## 2020-07-08 LAB — SURGICAL PCR SCREEN
MRSA, PCR: NEGATIVE
Staphylococcus aureus: NEGATIVE

## 2020-07-08 LAB — ABO/RH: ABO/RH(D): A POS

## 2020-07-08 SURGERY — HEMIARTHROPLASTY, HIP, DIRECT ANTERIOR APPROACH, FOR FRACTURE
Anesthesia: General | Site: Hip | Laterality: Right

## 2020-07-08 MED ORDER — CEFAZOLIN SODIUM-DEXTROSE 2-4 GM/100ML-% IV SOLN
2.0000 g | INTRAVENOUS | Status: AC
  Administered 2020-07-08: 2 g via INTRAVENOUS

## 2020-07-08 MED ORDER — ACETAMINOPHEN 160 MG/5ML PO SOLN: 325.0000 mg | Freq: Once | ORAL | Status: DC | PRN

## 2020-07-08 MED ORDER — METHOCARBAMOL 500 MG IVPB - SIMPLE MED
INTRAVENOUS | Status: AC
  Administered 2020-07-08: 500 mg via INTRAVENOUS
  Filled 2020-07-08: qty 50

## 2020-07-08 MED ORDER — LACTATED RINGERS IV SOLN
INTRAVENOUS | Status: DC | PRN
Start: 2020-07-08 — End: 2020-07-08

## 2020-07-08 MED ORDER — ONDANSETRON HCL 4 MG/2ML IJ SOLN
INTRAMUSCULAR | Status: AC
  Filled 2020-07-08: qty 2

## 2020-07-08 MED ORDER — CHLORHEXIDINE GLUCONATE CLOTH 2 % EX PADS
6.0000 | MEDICATED_PAD | Freq: Every day | CUTANEOUS | Status: DC
  Administered 2020-07-09 – 2020-07-13 (×5): 6 via TOPICAL

## 2020-07-08 MED ORDER — ACETAMINOPHEN 325 MG PO TABS: 325.0000 mg | ORAL_TABLET | Freq: Once | ORAL | Status: DC | PRN

## 2020-07-08 MED ORDER — OXYCODONE HCL 5 MG PO TABS: 10.0000 mg | ORAL_TABLET | ORAL | Status: DC | PRN

## 2020-07-08 MED ORDER — FENTANYL CITRATE (PF) 100 MCG/2ML IJ SOLN
INTRAMUSCULAR | Status: AC
  Filled 2020-07-08: qty 2

## 2020-07-08 MED ORDER — FENTANYL CITRATE (PF) 100 MCG/2ML IJ SOLN
25.0000 ug | INTRAMUSCULAR | Status: DC | PRN
  Administered 2020-07-08: 25 ug via INTRAVENOUS

## 2020-07-08 MED ORDER — HYDROMORPHONE HCL 1 MG/ML IJ SOLN
0.5000 mg | INTRAMUSCULAR | Status: DC | PRN
  Administered 2020-07-09: 1 mg via INTRAVENOUS
  Administered 2020-07-09: 0.5 mg via INTRAVENOUS
  Administered 2020-07-09 – 2020-07-10 (×5): 1 mg via INTRAVENOUS
  Filled 2020-07-08 (×6): qty 1

## 2020-07-08 MED ORDER — ONDANSETRON HCL 4 MG/2ML IJ SOLN
INTRAMUSCULAR | Status: DC | PRN
  Administered 2020-07-08: 4 mg via INTRAVENOUS

## 2020-07-08 MED ORDER — METOCLOPRAMIDE HCL 5 MG/ML IJ SOLN: 5.0000 mg | Freq: Three times a day (TID) | INTRAMUSCULAR | Status: DC | PRN

## 2020-07-08 MED ORDER — ALUM & MAG HYDROXIDE-SIMETH 200-200-20 MG/5ML PO SUSP: 30.0000 mL | ORAL | Status: DC | PRN

## 2020-07-08 MED ORDER — LORAZEPAM 2 MG/ML IJ SOLN
0.5000 mg | Freq: Four times a day (QID) | INTRAMUSCULAR | Status: AC | PRN
  Administered 2020-07-08: 0.5 mg via INTRAVENOUS
  Filled 2020-07-08: qty 1

## 2020-07-08 MED ORDER — CHLORHEXIDINE GLUCONATE 4 % EX LIQD: 60.0000 mL | Freq: Once | CUTANEOUS | Status: DC

## 2020-07-08 MED ORDER — BUPIVACAINE-EPINEPHRINE 0.5% -1:200000 IJ SOLN
INTRAMUSCULAR | Status: DC | PRN
  Administered 2020-07-08: 50 mL

## 2020-07-08 MED ORDER — ACETAMINOPHEN 10 MG/ML IV SOLN
INTRAVENOUS | Status: AC
  Filled 2020-07-08: qty 100

## 2020-07-08 MED ORDER — ACETAMINOPHEN 10 MG/ML IV SOLN
1000.0000 mg | Freq: Once | INTRAVENOUS | Status: AC
  Administered 2020-07-08: 1000 mg via INTRAVENOUS

## 2020-07-08 MED ORDER — ACETAMINOPHEN 10 MG/ML IV SOLN: 1000.0000 mg | Freq: Once | INTRAVENOUS | Status: DC | PRN

## 2020-07-08 MED ORDER — BISACODYL 5 MG PO TBEC: 5.0000 mg | DELAYED_RELEASE_TABLET | Freq: Every day | ORAL | Status: DC | PRN

## 2020-07-08 MED ORDER — DOCUSATE SODIUM 100 MG PO CAPS
100.0000 mg | ORAL_CAPSULE | Freq: Two times a day (BID) | ORAL | Status: DC
  Filled 2020-07-08 (×4): qty 1

## 2020-07-08 MED ORDER — ONDANSETRON HCL 4 MG/2ML IJ SOLN
4.0000 mg | Freq: Four times a day (QID) | INTRAMUSCULAR | Status: DC | PRN
  Administered 2020-07-08: 4 mg via INTRAVENOUS
  Filled 2020-07-08: qty 2

## 2020-07-08 MED ORDER — LACTATED RINGERS IV SOLN: INTRAVENOUS | Status: DC

## 2020-07-08 MED ORDER — SUGAMMADEX SODIUM 200 MG/2ML IV SOLN
INTRAVENOUS | Status: DC | PRN
  Administered 2020-07-08: 200 mg via INTRAVENOUS

## 2020-07-08 MED ORDER — ACETAMINOPHEN 325 MG PO TABS: 325.0000 mg | ORAL_TABLET | Freq: Four times a day (QID) | ORAL | Status: DC | PRN

## 2020-07-08 MED ORDER — LIDOCAINE 2% (20 MG/ML) 5 ML SYRINGE
INTRAMUSCULAR | Status: AC
  Filled 2020-07-08: qty 5

## 2020-07-08 MED ORDER — FENTANYL CITRATE (PF) 100 MCG/2ML IJ SOLN
INTRAMUSCULAR | Status: DC | PRN
  Administered 2020-07-08 (×6): 25 ug via INTRAVENOUS

## 2020-07-08 MED ORDER — TRANEXAMIC ACID-NACL 1000-0.7 MG/100ML-% IV SOLN
1000.0000 mg | INTRAVENOUS | Status: AC
  Administered 2020-07-08: 1000 mg via INTRAVENOUS

## 2020-07-08 MED ORDER — STERILE WATER FOR IRRIGATION IR SOLN
Status: DC | PRN
  Administered 2020-07-08: 2000 mL

## 2020-07-08 MED ORDER — ZOLPIDEM TARTRATE 5 MG PO TABS: 5.0000 mg | ORAL_TABLET | Freq: Every evening | ORAL | Status: DC | PRN

## 2020-07-08 MED ORDER — ASPIRIN EC 325 MG PO TBEC
325.0000 mg | DELAYED_RELEASE_TABLET | Freq: Two times a day (BID) | ORAL | Status: DC
  Administered 2020-07-11 – 2020-07-13 (×3): 325 mg via ORAL
  Filled 2020-07-08 (×4): qty 1

## 2020-07-08 MED ORDER — PHENYLEPHRINE HCL (PRESSORS) 10 MG/ML IV SOLN
INTRAVENOUS | Status: AC
  Filled 2020-07-08: qty 1

## 2020-07-08 MED ORDER — CEFAZOLIN SODIUM-DEXTROSE 2-4 GM/100ML-% IV SOLN
INTRAVENOUS | Status: AC
  Filled 2020-07-08: qty 100

## 2020-07-08 MED ORDER — POVIDONE-IODINE 10 % EX SWAB: 2.0000 "application " | Freq: Once | CUTANEOUS | Status: DC

## 2020-07-08 MED ORDER — TRANEXAMIC ACID-NACL 1000-0.7 MG/100ML-% IV SOLN
INTRAVENOUS | Status: AC
  Filled 2020-07-08: qty 100

## 2020-07-08 MED ORDER — LIDOCAINE 2% (20 MG/ML) 5 ML SYRINGE
INTRAMUSCULAR | Status: DC | PRN
  Administered 2020-07-08: 30 mg via INTRAVENOUS

## 2020-07-08 MED ORDER — FENTANYL CITRATE (PF) 100 MCG/2ML IJ SOLN
INTRAMUSCULAR | Status: AC
  Administered 2020-07-08: 25 ug via INTRAVENOUS
  Filled 2020-07-08: qty 2

## 2020-07-08 MED ORDER — OXYCODONE HCL 5 MG PO TABS
5.0000 mg | ORAL_TABLET | ORAL | Status: DC | PRN
  Administered 2020-07-08: 5 mg via ORAL
  Filled 2020-07-08: qty 2
  Filled 2020-07-08: qty 1

## 2020-07-08 MED ORDER — FERROUS SULFATE 325 (65 FE) MG PO TABS
325.0000 mg | ORAL_TABLET | Freq: Two times a day (BID) | ORAL | Status: DC
  Administered 2020-07-11 – 2020-07-13 (×4): 325 mg via ORAL
  Filled 2020-07-08 (×3): qty 1

## 2020-07-08 MED ORDER — PROPOFOL 10 MG/ML IV BOLUS
INTRAVENOUS | Status: AC
  Filled 2020-07-08: qty 20

## 2020-07-08 MED ORDER — LABETALOL HCL 5 MG/ML IV SOLN
5.0000 mg | Freq: Once | INTRAVENOUS | Status: AC
  Administered 2020-07-08: 5 mg via INTRAVENOUS
  Filled 2020-07-08: qty 4

## 2020-07-08 MED ORDER — ONDANSETRON HCL 4 MG PO TABS: 4.0000 mg | ORAL_TABLET | Freq: Four times a day (QID) | ORAL | Status: DC | PRN

## 2020-07-08 MED ORDER — DEXAMETHASONE SODIUM PHOSPHATE 10 MG/ML IJ SOLN
INTRAMUSCULAR | Status: DC | PRN
  Administered 2020-07-08: 4 mg via INTRAVENOUS

## 2020-07-08 MED ORDER — BUPIVACAINE-EPINEPHRINE 0.5% -1:200000 IJ SOLN
INTRAMUSCULAR | Status: AC
  Filled 2020-07-08: qty 1

## 2020-07-08 MED ORDER — DEXAMETHASONE SODIUM PHOSPHATE 10 MG/ML IJ SOLN
INTRAMUSCULAR | Status: AC
  Filled 2020-07-08: qty 1

## 2020-07-08 MED ORDER — ROCURONIUM BROMIDE 10 MG/ML (PF) SYRINGE
PREFILLED_SYRINGE | INTRAVENOUS | Status: DC | PRN
  Administered 2020-07-08: 60 mg via INTRAVENOUS

## 2020-07-08 MED ORDER — METOCLOPRAMIDE HCL 5 MG PO TABS: 5.0000 mg | ORAL_TABLET | Freq: Three times a day (TID) | ORAL | Status: DC | PRN

## 2020-07-08 MED ORDER — ROCURONIUM BROMIDE 10 MG/ML (PF) SYRINGE
PREFILLED_SYRINGE | INTRAVENOUS | Status: AC
  Filled 2020-07-08: qty 10

## 2020-07-08 MED ORDER — METHOCARBAMOL 500 MG PO TABS: 500.0000 mg | ORAL_TABLET | Freq: Four times a day (QID) | ORAL | Status: DC | PRN

## 2020-07-08 MED ORDER — METHOCARBAMOL 500 MG IVPB - SIMPLE MED
500.0000 mg | Freq: Four times a day (QID) | INTRAVENOUS | Status: DC | PRN
  Administered 2020-07-09: 500 mg via INTRAVENOUS
  Filled 2020-07-08: qty 500
  Filled 2020-07-08: qty 50

## 2020-07-08 MED ORDER — MAGNESIUM CITRATE PO SOLN: 1.0000 | Freq: Once | ORAL | Status: DC | PRN

## 2020-07-08 MED ORDER — DEXTROSE-NACL 5-0.45 % IV SOLN: INTRAVENOUS | Status: DC

## 2020-07-08 MED ORDER — POLYETHYLENE GLYCOL 3350 17 G PO PACK
17.0000 g | PACK | Freq: Every day | ORAL | Status: DC | PRN
  Filled 2020-07-08: qty 1

## 2020-07-08 MED ORDER — 0.9 % SODIUM CHLORIDE (POUR BTL) OPTIME
TOPICAL | Status: DC | PRN
  Administered 2020-07-08: 1000 mL

## 2020-07-08 MED ORDER — PROPOFOL 10 MG/ML IV BOLUS
INTRAVENOUS | Status: DC | PRN
  Administered 2020-07-08: 10 mg via INTRAVENOUS
  Administered 2020-07-08: 70 mg via INTRAVENOUS

## 2020-07-08 SURGICAL SUPPLY — 56 items
BAG ZIPLOCK 12X15 (MISCELLANEOUS) ×3 IMPLANT
BENZOIN TINCTURE PRP APPL 2/3 (GAUZE/BANDAGES/DRESSINGS) ×3 IMPLANT
BIPOLAR PROS AML 45 (Hips) ×2 IMPLANT
BIPOLAR PROS AML 45MM (Hips) ×1 IMPLANT
BIT DRILL 2.4X128 (BIT) ×2 IMPLANT
BIT DRILL 2.4X128MM (BIT) ×1
BLADE SAW SAG 73X25 THK (BLADE) ×2
BLADE SAW SGTL 73X25 THK (BLADE) ×1 IMPLANT
BRUSH FEMORAL CANAL (MISCELLANEOUS) IMPLANT
CLOSURE STERI-STRIP 1/2X4 (GAUZE/BANDAGES/DRESSINGS) ×1
CLSR STERI-STRIP ANTIMIC 1/2X4 (GAUZE/BANDAGES/DRESSINGS) ×2 IMPLANT
COVER WAND RF STERILE (DRAPES) IMPLANT
DRAPE INCISE IOBAN 66X45 STRL (DRAPES) ×3 IMPLANT
DRAPE ORTHO SPLIT 77X108 STRL (DRAPES) ×6
DRAPE POUCH INSTRU U-SHP 10X18 (DRAPES) ×3 IMPLANT
DRAPE SHEET LG 3/4 BI-LAMINATE (DRAPES) ×3 IMPLANT
DRAPE SURG ORHT 6 SPLT 77X108 (DRAPES) ×2 IMPLANT
DRSG AQUACEL AG ADV 3.5X10 (GAUZE/BANDAGES/DRESSINGS) ×3 IMPLANT
DRSG MEPILEX BORDER 4X8 (GAUZE/BANDAGES/DRESSINGS) IMPLANT
DURAPREP 26ML APPLICATOR (WOUND CARE) ×3 IMPLANT
ELECT BLADE TIP CTD 4 INCH (ELECTRODE) ×3 IMPLANT
ELECT REM PT RETURN 15FT ADLT (MISCELLANEOUS) ×3 IMPLANT
EVACUATOR 1/8 PVC DRAIN (DRAIN) IMPLANT
GLOVE BIO SURGEON STRL SZ8 (GLOVE) ×6 IMPLANT
GLOVE ORTHO TXT STRL SZ7.5 (GLOVE) ×6 IMPLANT
HANDPIECE INTERPULSE COAX TIP (DISPOSABLE)
HEAD BIPOLAR PROS AML 45 (Hips) ×1 IMPLANT
HEAD FEM STD 28X+1.5 STRL (Hips) ×3 IMPLANT
HOOD PEEL AWAY FLYTE STAYCOOL (MISCELLANEOUS) ×6 IMPLANT
IMMOBILIZER KNEE 20 (SOFTGOODS) ×3
IMMOBILIZER KNEE 20 THIGH 36 (SOFTGOODS) ×1 IMPLANT
KIT TURNOVER KIT A (KITS) IMPLANT
MANIFOLD NEPTUNE II (INSTRUMENTS) ×3 IMPLANT
NEEDLE HYPO 22GX1.5 SAFETY (NEEDLE) ×3 IMPLANT
NS IRRIG 1000ML POUR BTL (IV SOLUTION) ×3 IMPLANT
PACK TOTAL JOINT (CUSTOM PROCEDURE TRAY) ×3 IMPLANT
PASSER SUT SWANSON 36MM LOOP (INSTRUMENTS) ×3 IMPLANT
PENCIL SMOKE EVACUATOR (MISCELLANEOUS) IMPLANT
PRESSURIZER FEMORAL UNIV (MISCELLANEOUS) IMPLANT
PROTECTOR NERVE ULNAR (MISCELLANEOUS) ×3 IMPLANT
SET HNDPC FAN SPRY TIP SCT (DISPOSABLE) IMPLANT
STAPLER VISISTAT 35W (STAPLE) ×3 IMPLANT
STEM SUMMIT BASIC PRESSFIT SZ4 (Hips) ×3 IMPLANT
SUCTION FRAZIER HANDLE 10FR (MISCELLANEOUS) ×3
SUCTION TUBE FRAZIER 10FR DISP (MISCELLANEOUS) ×1 IMPLANT
SUT ETHIBOND NAB CT1 #1 30IN (SUTURE) ×12 IMPLANT
SUT VIC AB 0 CTX 27 (SUTURE) ×6 IMPLANT
SUT VIC AB 1 CTX 36 (SUTURE) ×12
SUT VIC AB 1 CTX36XBRD ANBCTR (SUTURE) ×4 IMPLANT
SUT VIC AB 2-0 CT1 27 (SUTURE) ×9
SUT VIC AB 2-0 CT1 TAPERPNT 27 (SUTURE) ×3 IMPLANT
SYR CONTROL 10ML LL (SYRINGE) ×3 IMPLANT
TOWEL OR 17X26 10 PK STRL BLUE (TOWEL DISPOSABLE) ×3 IMPLANT
TOWER CARTRIDGE SMART MIX (DISPOSABLE) IMPLANT
TRAY FOLEY MTR SLVR 16FR STAT (SET/KITS/TRAYS/PACK) IMPLANT
WATER STERILE IRR 1000ML POUR (IV SOLUTION) ×3 IMPLANT

## 2020-07-08 NOTE — Anesthesia Procedure Notes (Signed)
Procedure Name: Intubation Date/Time: 07/08/2020 10:47 AM Performed by: Montel Clock, CRNA Pre-anesthesia Checklist: Patient identified, Emergency Drugs available, Suction available, Patient being monitored and Timeout performed Patient Re-evaluated:Patient Re-evaluated prior to induction Oxygen Delivery Method: Circle system utilized Preoxygenation: Pre-oxygenation with 100% oxygen Induction Type: IV induction Ventilation: Mask ventilation without difficulty Laryngoscope Size: Mac and 3 Grade View: Grade I Tube type: Oral Tube size: 7.0 mm Number of attempts: 1 Airway Equipment and Method: Stylet Placement Confirmation: ETT inserted through vocal cords under direct vision,  positive ETCO2 and breath sounds checked- equal and bilateral Secured at: 20 cm Tube secured with: Tape Dental Injury: Teeth and Oropharynx as per pre-operative assessment

## 2020-07-08 NOTE — Anesthesia Postprocedure Evaluation (Signed)
Anesthesia Post Note  Patient: Anna Curtis  Procedure(s) Performed: ARTHROPLASTY BIPOLAR HIP (HEMIARTHROPLASTY) (Right Hip)     Patient location during evaluation: PACU Anesthesia Type: General Level of consciousness: awake and alert Pain management: pain level controlled Vital Signs Assessment: post-procedure vital signs reviewed and stable Respiratory status: spontaneous breathing, nonlabored ventilation, respiratory function stable and patient connected to nasal cannula oxygen Cardiovascular status: blood pressure returned to baseline and stable Postop Assessment: no apparent nausea or vomiting Anesthetic complications: no   No complications documented.  Last Vitals:  Vitals:   07/08/20 1345 07/08/20 1400  BP: (!) 169/69 140/62  Pulse: 69 82  Resp: 12 18  Temp:    SpO2: 100% 100%    Last Pain:  Vitals:   07/08/20 0930  TempSrc:   PainSc: [name]

## 2020-07-08 NOTE — Plan of Care (Signed)

## 2020-07-08 NOTE — Consult Note (Signed)
Reason for Consult: Femoral neck fracture right Referring Physician: Dr. Truett Mainland Anna Curtis is an 86 y.o. female.  HPI: Is an 86 year old female who is a household ambulator who fell and began complaining of right hip pain.  She has been not ambulating because of significant pain in the hip.  She was brought to the hospital and noted to have a femoral neck fracture.  Dr. Lucia Gaskins admitted her and asked that we look into take over her care.  I have discussed the case with she and her family.  They are aware of her femoral neck fracture and do wish to proceed with surgery.  Past Medical History:  Diagnosis Date  . Bladder prolapse, female, acquired   . Cancer ([place])    breast 2002 LEFT RADIATION AND SX DONE  . Dementia ([place])   . Hypertension    OFF BP MEDS X 2 YEARS  . TIA (transient ischemic attack) 2014    Past Surgical History:  Procedure Laterality Date  . ABDOMINAL HYSTERECTOMY     2007  . BLADDER TACH    . BREAST SURGERY Left 2002   CANCER LUMPECTOMY    Family History  Problem Relation Age of Onset  . Cancer Mother   . Cancer Brother     Social History:  reports that she has quit smoking. Her smoking use included cigarettes. She has a 40.00 pack-year smoking history. She has never used smokeless tobacco. She reports current alcohol use of about 1.0 standard drink of alcohol per week. She reports that she does not use drugs.  Allergies: No Known Allergies  Medications: I have reviewed the patient's current medications.  Results for orders placed or performed during the hospital encounter of 07/07/20 (from the past 48 hour(s))  Urinalysis, Routine w reflex microscopic     Status: Abnormal   Collection Time: 07/07/20  7:36 PM  Result Value Ref Range   Color, Urine YELLOW YELLOW   APPearance CLEAR CLEAR   Specific Gravity, Urine 1.011 1.005 - 1.030   pH 5.0 5.0 - 8.0   Glucose, UA NEGATIVE NEGATIVE mg/dL   Hgb urine dipstick MODERATE (A) NEGATIVE   Bilirubin Urine  NEGATIVE NEGATIVE   Ketones, ur NEGATIVE NEGATIVE mg/dL   Protein, ur NEGATIVE NEGATIVE mg/dL   Nitrite NEGATIVE NEGATIVE   Leukocytes,Ua MODERATE (A) NEGATIVE   RBC / HPF 0-5 0 - 5 RBC/hpf   WBC, UA 0-5 0 - 5 WBC/hpf   Bacteria, UA RARE (A) NONE SEEN   Squamous Epithelial / LPF 6-10 0 - 5   Mucus PRESENT     Comment: Performed at [facility], [place] [address], [place], [place] [address]  CBC with Differential/Platelet     Status: Abnormal   Collection Time: 07/07/20  8:06 PM  Result Value Ref Range   WBC 7.1 4.0 - 10.5 K/uL   RBC 3.51 (L) 3.87 - 5.11 MIL/uL   Hemoglobin 11.0 (L) 12.0 - 15.0 g/dL   HCT 33.4 (L) 36 - 46 %   MCV 95.2 80.0 - 100.0 fL   MCH 31.3 26.0 - 34.0 pg   MCHC 32.9 30.0 - 36.0 g/dL   RDW 14.4 11.5 - 15.5 %   Platelets 388 150 - 400 K/uL   nRBC 0.0 0.0 - 0.2 %   Neutrophils Relative % 75 %   Neutro Abs 5.4 1.7 - 7.7 K/uL   Lymphocytes Relative 12 %   Lymphs Abs 0.8 0.7 - 4.0 K/uL   Monocytes  Relative 10 %   Monocytes Absolute 0.7 0 - 1 K/uL   Eosinophils Relative 2 %   Eosinophils Absolute 0.2 0 - 0 K/uL   Basophils Relative 1 %   Basophils Absolute 0.1 0 - 0 K/uL   Immature Granulocytes 0 %   Abs Immature Granulocytes 0.02 0.00 - 0.07 K/uL    Comment: Performed at [facility], [place] [address], [place], [place] [address]  Basic metabolic panel     Status: Abnormal   Collection Time: 07/07/20  8:06 PM  Result Value Ref Range   Sodium 138 135 - 145 mmol/L   Potassium 4.5 3.5 - 5.1 mmol/L   Chloride 104 98 - 111 mmol/L   CO2 24 22 - 32 mmol/L   Glucose, Bld 95 70 - 99 mg/dL    Comment: Glucose reference range applies only to samples taken after fasting for at least 8 hours.   BUN 17 8 - 23 mg/dL   Creatinine, Ser 0.80 0.44 - 1.00 mg/dL   Calcium 8.5 (L) 8.9 - 10.3 mg/dL   GFR calc non Af Amer >60 >60 mL/min   GFR calc Af Amer >60 >60 mL/min   Anion gap 10 5 - 15    Comment: Performed at [facility], [place] [address], [place], [place] [address]  Protime-INR     Status: None   Collection Time: 07/07/20  8:06 PM  Result Value Ref Range   Prothrombin Time 12.4 11.4 - 15.2 seconds   INR 1.0 0.8 - 1.2    Comment: (NOTE) INR goal varies based on device and disease states. Performed at [facility], [place] [address] [place], [place] [address]   Type and screen [place]     Status: None   Collection Time: 07/07/20  8:06 PM  Result Value Ref Range   ABO/RH(D) A POS    Antibody Screen NEG    Sample Expiration      07/10/2020,2359 Performed at [facility], [place] [address] [place], [place] [address]   SARS Coronavirus 2 by RT PCR (hospital order, performed in [facility] hospital lab) Nasopharyngeal Nasopharyngeal Swab     Status: None   Collection Time: 07/07/20  8:06 PM   Specimen: Nasopharyngeal Swab  Result Value Ref Range   SARS Coronavirus 2 NEGATIVE NEGATIVE    Comment: (NOTE) SARS-CoV-2 target nucleic acids are NOT DETECTED.  The SARS-CoV-2 RNA is generally detectable in upper and lower respiratory specimens during the acute phase of infection. The lowest concentration of SARS-CoV-2 viral copies this assay can detect is 250 copies / mL. A negative result does not preclude SARS-CoV-2 infection and should not be used as the sole basis for treatment or other patient management decisions.  A negative result may occur with improper specimen collection / handling, submission of specimen other than nasopharyngeal swab, presence of viral mutation(s) within the areas targeted by this assay, and inadequate number of viral copies (<250 copies / mL). A negative result must be combined with clinical observations, patient history, and epidemiological information.  Fact Sheet for Patients:   StrictlyIdeas.no  Fact Sheet for Healthcare Providers: BankingDealers.co.za  This  test is not yet approved or  cleared by the [place] FDA and has been authorized for detection and/or diagnosis of SARS-CoV-2 by FDA under an Emergency Use Authorization (EUA).  This EUA will remain in effect (meaning this test can be used) for the duration of the COVID-19 declaration  under Section 564(b)(1) of the Act, 21 U.S.C. section 360bbb-3(b)(1), unless the authorization is terminated or revoked sooner.  Performed at [facility], [place] [address] [place], [place] [address]   ABO/Rh     Status: None   Collection Time: 07/07/20  9:04 PM  Result Value Ref Range   ABO/RH(D)      A POS Performed at [facility], [place] [address] [place], [place] [address]     DG HIP UNILAT WITH PELVIS 2-3 VIEWS RIGHT  Result Date: 07/07/2020 CLINICAL DATA:  Right hip pain for 5 days. EXAM: DG HIP (WITH OR WITHOUT PELVIS) 2-3V RIGHT COMPARISON:  None. FINDINGS: There is a right femoral neck fracture with impaction. No subluxation or dislocation. Vascular calcifications noted. IMPRESSION: Right femoral neck fracture with impaction. These results will be called to the ordering clinician or representative by the Radiology Department at the imaging location. Electronically Signed   By: Rolm Baptise M.D.   On: 07/07/2020 16:48    ROS  ROS: I have reviewed the patient's review of systems thoroughly and there are no positive responses as relates to the HPI. Blood pressure (!) 157/58, pulse 67, temperature 97.8 F (36.6 C), temperature source Axillary, resp. rate 18, SpO2 100 %. Physical Exam Well-developed well-nourished patient in no acute distress. Alert and oriented x3 HEENT:within normal limits Cardiac: Regular rate and rhythm Pulmonary: Lungs clear to auscultation Abdomen: Soft and nontender.  Normal active bowel sounds  Musculoskeletal: (Right hip: Painful range of motion.  Limited range of motion.  Neurovascular intact distally.  Obvious external rotation  and shortened deformity.) Assessment/Plan: 86 year old household ambulator has been dwindling terms of overall health.  She has fallen and suffered a femoral neck fracture.//We had a long discussion with she and her family about treatment options.  At this point hemiarthroplasty is the most appropriate course of action.  She and her family are well aware of the risks and benefits of surgery.  They are well aware of the risks of bleeding, infection, dislocation, need for further surgery, and the slight risk of death and and around the time of surgery.  Understanding these risks I do wish to proceed.  Alta Corning 07/08/2020, 10:32 AM

## 2020-07-08 NOTE — Transfer of Care (Signed)
Immediate Anesthesia Transfer of Care Note  Patient: Anna Curtis  Procedure(s) Performed: ARTHROPLASTY BIPOLAR HIP (HEMIARTHROPLASTY) (Right Hip)  Patient Location: PACU  Anesthesia Type:General  Level of Consciousness: drowsy  Airway & Oxygen Therapy: Patient Spontanous Breathing and Patient connected to face mask oxygen  Post-op Assessment: Report given to RN and Post -op Vital signs reviewed and stable  Post vital signs: Reviewed and stable  Last Vitals:  Vitals Value Taken Time  BP 186/111 07/08/20 1252  Temp    Pulse 70 07/08/20 1253  Resp 15 07/08/20 1257  SpO2 77 % 07/08/20 1253  Vitals shown include unvalidated device data.  Last Pain:  Vitals:   07/08/20 0930  TempSrc:   PainSc: 4       Patients Stated Pain Goal: 5 (86/76/72 0947)  Complications: No complications documented.

## 2020-07-08 NOTE — Anesthesia Preprocedure Evaluation (Addendum)
Anesthesia Evaluation  Patient identified by MRN, date of birth, ID band Patient awake    Reviewed: Allergy & Precautions, NPO status , Patient's Chart, lab work & pertinent test results  Airway Mallampati: IV       Dental  (+) Upper Dentures, Lower Dentures   Pulmonary former smoker,    Pulmonary exam normal        Cardiovascular hypertension,  Rhythm:Regular Rate:Normal     Neuro/Psych PSYCHIATRIC DISORDERS Dementia TIA   GI/Hepatic negative GI ROS, Neg liver ROS,   Endo/Other  negative endocrine ROS  Renal/GU negative Renal ROS     Musculoskeletal negative musculoskeletal ROS (+)   Abdominal Normal abdominal exam  (+)   Peds  Hematology negative hematology ROS (+)   Anesthesia Other Findings Pt not cooperative  Reproductive/Obstetrics                            Anesthesia Physical Anesthesia Plan  ASA: III  Anesthesia Plan: General   Post-op Pain Management:    Induction: Intravenous  PONV Risk Score and Plan: 4 or greater and Ondansetron, Dexamethasone, Propofol infusion and Treatment may vary due to age or medical condition  Airway Management Planned: Oral ETT  Additional Equipment: None  Intra-op Plan:   Post-operative Plan:   Informed Consent: I have reviewed the patients History and Physical, chart, labs and discussed the procedure including the risks, benefits and alternatives for the proposed anesthesia with the patient or authorized representative who has indicated his/her understanding and acceptance.     Consent reviewed with POA  Plan Discussed with: CRNA  Anesthesia Plan Comments: (Lab Results      Component                Value               Date                      WBC                      7.1                 07/07/2020                HGB                      11.0 (L)            07/07/2020                HCT                      33.4 (L)             07/07/2020                MCV                      95.2                07/07/2020                PLT                      388                 07/07/2020           )  Anesthesia Quick Evaluation

## 2020-07-08 NOTE — Progress Notes (Signed)
PROGRESS NOTE    Anna Curtis   ENI:[id number]  DOB: 1931/02/01  DOA: 07/07/2020 PCP: Josetta Huddle, MD   Brief Narrative:  Anna Curtis is a 86 y.o. 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  Patient lives with her son and last week he noticed that she has been having more unsteady gait and pain to her right side.  Patient is followed by palliative care monthly and they thought that perhaps patient had a UTI initially prescribed her antibiotics last week.  However she continues to have unsteady gait and was able to ambulate freely before without assistance.  They presented to her primary care physician today and had x-ray showing right femoral neck fracture with impaction.  Son is not sure whether patient got up out of bed on her own in the morning and fell.  At baseline, patient is oriented to self and sometimes family member.  She is able to ambulate freely and able to feed herself.  Although needs help with cooking and bathing.   Subjective: No complaints.     Assessment & Plan:   Principal Problem:   Closed right hip fracture (HCC) - s/p surgery today-   The Op note is still pending but a hemiarthroplasty was planned - seen post op- is doing well - follow ortho recommendations  Active Problems:   Alzheimer's disease ([place]) - with dementia - follow for behavioral disturbances -she is followed by palliative care at home   H/o colon cancer  Time spent in minutes: 25 DVT prophylaxis: per ortho Code Status: DNR Family Communication: with daughters Disposition Plan:  Status is: Inpatient  Remains inpatient appropriate because:Unsafe d/c plan   Dispo: The patient is from: Home              Anticipated d/c is to: TBD - await PT eval              Anticipated d/c date is: 3 days              Patient  currently is not medically stable to d/c.      Consultants:   ortho Procedures:     Antimicrobials:  Anti-infectives (From admission, onward)   Start     Dose/Rate Route Frequency Ordered Stop   07/08/20 1000  ceFAZolin (ANCEF) IVPB 2g/100 mL premix        2 g 200 mL/hr over 30 Minutes Intravenous On call to O.R. 07/08/20 0953 07/08/20 1058   07/08/20 0923  ceFAZolin (ANCEF) 2-4 GM/100ML-% IVPB       Note to Pharmacy: Mardelle Matte   : cabinet override      07/08/20 0923 07/08/20 1051       Objective: Vitals:   07/08/20 1400 07/08/20 1415 07/08/20 1634 07/08/20 1736  BP: 140/62 (!) 174/148 134/75 (!) 163/100  Pulse: 82 74 80 85  Resp: 18 16 16 19   Temp:  97.6 F (36.4 C) 97.9 F (36.6 C)   TempSrc:  Oral Oral   SpO2: 100% 98% 94% 99%    Intake/Output Summary (Last 24 hours) at 07/08/2020 1806 Last data filed at 07/08/2020 1400 Gross per 24 hour  Intake 1900 ml  Output 750 ml  Net 1150 ml   There were no vitals filed for this visit.  Examination: General exam: Appears comfortable  HEENT: PERRLA, oral mucosa moist, no sclera icterus  or thrush Respiratory system: Clear to auscultation. Respiratory effort normal. Cardiovascular system: S1 & S2 heard, RRR.   Gastrointestinal system: Abdomen soft, non-tender, nondistended. Normal bowel sounds. Central nervous system: Alert and oriented. No focal neurological deficits. Extremities: No cyanosis, clubbing or edema Skin: No rashes or ulcers Psychiatry:  Mood & affect appropriate.     Data Reviewed: I have personally reviewed following labs and imaging studies  CBC: Recent Labs  Lab 07/07/20 2006  WBC 7.1  NEUTROABS 5.4  HGB 11.0*  HCT 33.4*  MCV 95.2  PLT 025   Basic Metabolic Panel: Recent Labs  Lab 07/07/20 2006  NA 138  K 4.5  CL 104  CO2 24  GLUCOSE 95  BUN 17  CREATININE 0.80  CALCIUM 8.5*   GFR: CrCl cannot be calculated (Unknown ideal weight.). Liver Function Tests: No results for  input(s): AST, ALT, ALKPHOS, BILITOT, PROT, ALBUMIN in the last 168 hours. No results for input(s): LIPASE, AMYLASE in the last 168 hours. No results for input(s): AMMONIA in the last 168 hours. Coagulation Profile: Recent Labs  Lab 07/07/20 2006  INR 1.0   Cardiac Enzymes: No results for input(s): CKTOTAL, CKMB, CKMBINDEX, TROPONINI in the last 168 hours. BNP (last 3 results) No results for input(s): PROBNP in the last 8760 hours. HbA1C: No results for input(s): HGBA1C in the last 72 hours. CBG: No results for input(s): GLUCAP in the last 168 hours. Lipid Profile: No results for input(s): CHOL, HDL, LDLCALC, TRIG, CHOLHDL, LDLDIRECT in the last 72 hours. Thyroid Function Tests: No results for input(s): TSH, T4TOTAL, FREET4, T3FREE, THYROIDAB in the last 72 hours. Anemia Panel: No results for input(s): VITAMINB12, FOLATE, FERRITIN, TIBC, IRON, RETICCTPCT in the last 72 hours. Urine analysis:    Component Value Date/Time   COLORURINE YELLOW 07/07/2020 1936   APPEARANCEUR CLEAR 07/07/2020 1936   LABSPEC 1.011 07/07/2020 1936   PHURINE 5.0 07/07/2020 1936   GLUCOSEU NEGATIVE 07/07/2020 1936   HGBUR MODERATE (A) 07/07/2020 1936   BILIRUBINUR NEGATIVE 07/07/2020 1936   KETONESUR NEGATIVE 07/07/2020 1936   PROTEINUR NEGATIVE 07/07/2020 1936   UROBILINOGEN 0.2 08/12/2015 1612   NITRITE NEGATIVE 07/07/2020 1936   LEUKOCYTESUR MODERATE (A) 07/07/2020 1936   Sepsis Labs: @LABRCNTIP (procalcitonin:4,lacticidven:4) ) Recent Results (from the past 240 hour(s))  SARS Coronavirus 2 by RT PCR (hospital order, performed in [place] hospital lab) Nasopharyngeal Nasopharyngeal Swab     Status: None   Collection Time: 07/07/20  8:06 PM   Specimen: Nasopharyngeal Swab  Result Value Ref Range Status   SARS Coronavirus 2 NEGATIVE NEGATIVE Final    Comment: (NOTE) SARS-CoV-2 target nucleic acids are NOT DETECTED.  The SARS-CoV-2 RNA is generally detectable in upper and lower respiratory  specimens during the acute phase of infection. The lowest concentration of SARS-CoV-2 viral copies this assay can detect is 250 copies / mL. A negative result does not preclude SARS-CoV-2 infection and should not be used as the sole basis for treatment or other patient management decisions.  A negative result may occur with improper specimen collection / handling, submission of specimen other than nasopharyngeal swab, presence of viral mutation(s) within the areas targeted by this assay, and inadequate number of viral copies (<250 copies / mL). A negative result must be combined with clinical observations, patient history, and epidemiological information.  Fact Sheet for Patients:   StrictlyIdeas.no  Fact Sheet for Healthcare Providers: BankingDealers.co.za  This test is not yet approved or  cleared by the [place] FDA and has  been authorized for detection and/or diagnosis of SARS-CoV-2 by FDA under an Emergency Use Authorization (EUA).  This EUA will remain in effect (meaning this test can be used) for the duration of the COVID-19 declaration under Section 564(b)(1) of the Act, 21 U.S.C. section 360bbb-3(b)(1), unless the authorization is terminated or revoked sooner.  Performed at [facility], [place] [address] [place], [place] [address]   Surgical pcr screen     Status: None   Collection Time: 07/08/20  9:07 AM   Specimen: Nasal Mucosa; Nasal Swab  Result Value Ref Range Status   MRSA, PCR NEGATIVE NEGATIVE Final   Staphylococcus aureus NEGATIVE NEGATIVE Final    Comment: (NOTE) The Xpert SA Assay (FDA approved for NASAL specimens in patients 20 years of age and older), is one component of a comprehensive surveillance program. It is not intended to diagnose infection nor to guide or monitor treatment. Performed at [facility], [place] [address] [place],  [address]           Radiology Studies: Pelvis Portable  Result Date: 07/08/2020 CLINICAL DATA:  Status post RIGHT hip arthroplasty. EXAM: PORTABLE PELVIS 1-2 VIEWS COMPARISON:  July 08, 2019 FINDINGS: Post RIGHT hip arthroplasty, osteopenia. The greater trochanter is incompletely imaged on submitted views. Visualized portions of the RIGHT proximal femur show no acute process. On AP view the femoral component appears well seated. Acetabular component within the acetabulum. Limited view of the pelvis excludes the upper margin of the iliac crest. No signs of pelvic fracture on submitted views which are limited by obliquity and field of view as discussed. IMPRESSION: 1. Post RIGHT hip arthroplasty without complicating features. 2. No signs of pelvic fracture on submitted views which are limited by field of view and obliquity. Given incomplete visualization of the greater trochanter a follow-up evaluation is suggested when the patient is able to include the RIGHT hip and greater trochanter. Gas in the soft tissues adjacent to the hip compatible with postoperative change. Electronically Signed   By: Zetta Bills M.D.   On: 07/08/2020 13:44   DG HIP UNILAT WITH PELVIS 2-3 VIEWS RIGHT  Result Date: 07/07/2020 CLINICAL DATA:  Right hip pain for 5 days. EXAM: DG HIP (WITH OR WITHOUT PELVIS) 2-3V RIGHT COMPARISON:  None. FINDINGS: There is a right femoral neck fracture with impaction. No subluxation or dislocation. Vascular calcifications noted. IMPRESSION: Right femoral neck fracture with impaction. These results will be called to the ordering clinician or representative by the Radiology Department at the imaging location. Electronically Signed   By: Rolm Baptise M.D.   On: 07/07/2020 16:48      Scheduled Meds: . [START ON 07/09/2020] aspirin EC  325 mg Oral BID  . [START ON 07/09/2020] Chlorhexidine Gluconate Cloth  6 each Topical Daily  . docusate sodium  100 mg Oral BID  . [START ON 07/09/2020] ferrous sulfate  325 mg  Oral BID WC  . HYDROcodone-acetaminophen  1 tablet Oral Q6H  . ondansetron (ZOFRAN) IV  4 mg Intravenous Once  . senna  1 tablet Oral BID   Continuous Infusions: . acetaminophen    . dextrose 5 % and 0.45% NaCl    . methocarbamol (ROBAXIN) IV 500 mg (07/08/20 1323)     LOS: 1 day      Debbe Odea, MD [facility] Pager: [email or web address] 07/08/2020, 6:06 PM

## 2020-07-08 NOTE — Consult Note (Signed)
Reason for Consult: Right hip fracture Referring Physician: [facility]  Anna Curtis is an 86 y.o. female.  HPI: Was brought to the emergency department due to x-ray revealing fracture of the right femoral neck obtained by primary care physician.  Patient family states she began having pain earlier in the week in the right hip.  Primary care physician ordered an x-ray which revealed a fracture.  She was sent to the ER.  Patient is severely demented and does not follow commands.  She was agitated in the ER and subsequent x-rays or imaging was unable to be obtained.  Orthopedics was consulted due to evidence of fracture.  Discussed with the family and patient ambulates at baseline.  They deny any indication of discomfort prior to the onset several days ago.  They deny any fall or trauma.  Past Medical History:  Diagnosis Date  . Bladder prolapse, female, acquired   . Cancer ([place])    breast 2002 LEFT RADIATION AND SX DONE  . Dementia ([place])   . Hypertension    OFF BP MEDS X 2 YEARS  . TIA (transient ischemic attack) 2014    Past Surgical History:  Procedure Laterality Date  . ABDOMINAL HYSTERECTOMY     2007  . BLADDER TACH    . BREAST SURGERY Left 2002   CANCER LUMPECTOMY    Family History  Problem Relation Age of Onset  . Cancer Mother   . Cancer Brother     Social History:  reports that she has quit smoking. Her smoking use included cigarettes. She has a 40.00 pack-year smoking history. She has never used smokeless tobacco. She reports current alcohol use of about 1.0 standard drink of alcohol per week. She reports that she does not use drugs.  Allergies: No Known Allergies  Medications: I have reviewed the patient's current medications.  Results for orders placed or performed during the hospital encounter of 07/07/20 (from the past 48 hour(s))  Urinalysis, Routine w reflex microscopic     Status: Abnormal   Collection Time: 07/07/20  7:36 PM  Result  Value Ref Range   Color, Urine YELLOW YELLOW   APPearance CLEAR CLEAR   Specific Gravity, Urine 1.011 1.005 - 1.030   pH 5.0 5.0 - 8.0   Glucose, UA NEGATIVE NEGATIVE mg/dL   Hgb urine dipstick MODERATE (A) NEGATIVE   Bilirubin Urine NEGATIVE NEGATIVE   Ketones, ur NEGATIVE NEGATIVE mg/dL   Protein, ur NEGATIVE NEGATIVE mg/dL   Nitrite NEGATIVE NEGATIVE   Leukocytes,Ua MODERATE (A) NEGATIVE   RBC / HPF 0-5 0 - 5 RBC/hpf   WBC, UA 0-5 0 - 5 WBC/hpf   Bacteria, UA RARE (A) NONE SEEN   Squamous Epithelial / LPF 6-10 0 - 5   Mucus PRESENT     Comment: Performed at [facility], [place] [address], [place], [place] [address]  CBC with Differential/Platelet     Status: Abnormal   Collection Time: 07/07/20  8:06 PM  Result Value Ref Range   WBC 7.1 4.0 - 10.5 K/uL   RBC 3.51 (L) 3.87 - 5.11 MIL/uL   Hemoglobin 11.0 (L) 12.0 - 15.0 g/dL   HCT 33.4 (L) 36 - 46 %   MCV 95.2 80.0 - 100.0 fL   MCH 31.3 26.0 - 34.0 pg   MCHC 32.9 30.0 - 36.0 g/dL   RDW 14.4 11.5 - 15.5 %   Platelets 388 150 - 400 K/uL   nRBC 0.0 0.0 - 0.2 %  Neutrophils Relative % 75 %   Neutro Abs 5.4 1.7 - 7.7 K/uL   Lymphocytes Relative 12 %   Lymphs Abs 0.8 0.7 - 4.0 K/uL   Monocytes Relative 10 %   Monocytes Absolute 0.7 0 - 1 K/uL   Eosinophils Relative 2 %   Eosinophils Absolute 0.2 0 - 0 K/uL   Basophils Relative 1 %   Basophils Absolute 0.1 0 - 0 K/uL   Immature Granulocytes 0 %   Abs Immature Granulocytes 0.02 0.00 - 0.07 K/uL    Comment: Performed at [facility], [place] [address] [place], [place] [address]  Basic metabolic panel     Status: Abnormal   Collection Time: 07/07/20  8:06 PM  Result Value Ref Range   Sodium 138 135 - 145 mmol/L   Potassium 4.5 3.5 - 5.1 mmol/L   Chloride 104 98 - 111 mmol/L   CO2 24 22 - 32 mmol/L   Glucose, Bld 95 70 - 99 mg/dL    Comment: Glucose reference range applies only to samples taken after fasting for at least 8 hours.   BUN 17  8 - 23 mg/dL   Creatinine, Ser 0.80 0.44 - 1.00 mg/dL   Calcium 8.5 (L) 8.9 - 10.3 mg/dL   GFR calc non Af Amer >60 >60 mL/min   GFR calc Af Amer >60 >60 mL/min   Anion gap 10 5 - 15    Comment: Performed at [facility], [place] [address] [place], [place] [address]  Protime-INR     Status: None   Collection Time: 07/07/20  8:06 PM  Result Value Ref Range   Prothrombin Time 12.4 11.4 - 15.2 seconds   INR 1.0 0.8 - 1.2    Comment: (NOTE) INR goal varies based on device and disease states. Performed at [facility], [place] [address], [place], [place] [address]   Type and screen [place]     Status: None   Collection Time: 07/07/20  8:06 PM  Result Value Ref Range   ABO/RH(D) A POS    Antibody Screen NEG    Sample Expiration      07/10/2020,2359 Performed at [facility], [place] [address], [place],  [address]   SARS Coronavirus 2 by RT PCR (hospital order, performed in [facility] hospital lab) Nasopharyngeal Nasopharyngeal Swab     Status: None   Collection Time: 07/07/20  8:06 PM   Specimen: Nasopharyngeal Swab  Result Value Ref Range   SARS Coronavirus 2 NEGATIVE NEGATIVE    Comment: (NOTE) SARS-CoV-2 target nucleic acids are NOT DETECTED.  The SARS-CoV-2 RNA is generally detectable in upper and lower respiratory specimens during the acute phase of infection. The lowest concentration of SARS-CoV-2 viral copies this assay can detect is 250 copies / mL. A negative result does not preclude SARS-CoV-2 infection and should not be used as the sole basis for treatment or other patient management decisions.  A negative result may occur with improper specimen collection / handling, submission of specimen other than nasopharyngeal swab, presence of viral mutation(s) within the areas targeted by this assay, and inadequate number of viral copies (<250 copies / mL). A negative result must be combined with  clinical observations, patient history, and epidemiological information.  Fact Sheet for Patients:   StrictlyIdeas.no  Fact Sheet for Healthcare Providers: BankingDealers.co.za  This test is not yet approved or  cleared by the [place] FDA and has been authorized for detection  and/or diagnosis of SARS-CoV-2 by FDA under an Emergency Use Authorization (EUA).  This EUA will remain in effect (meaning this test can be used) for the duration of the COVID-19 declaration under Section 564(b)(1) of the Act, 21 U.S.C. section 360bbb-3(b)(1), unless the authorization is terminated or revoked sooner.  Performed at [facility], [place] [address], [place], [place] [address]   ABO/Rh     Status: None   Collection Time: 07/07/20  9:04 PM  Result Value Ref Range   ABO/RH(D)      A POS Performed at [facility], [place] [address] [place], [place] [address]     DG HIP UNILAT WITH PELVIS 2-3 VIEWS RIGHT  Result Date: 07/07/2020 CLINICAL DATA:  Right hip pain for 5 days. EXAM: DG HIP (WITH OR WITHOUT PELVIS) 2-3V RIGHT COMPARISON:  None. FINDINGS: There is a right femoral neck fracture with impaction. No subluxation or dislocation. Vascular calcifications noted. IMPRESSION: Right femoral neck fracture with impaction. These results will be called to the ordering clinician or representative by the Radiology Department at the imaging location. Electronically Signed   By: Rolm Baptise M.D.   On: 07/07/2020 16:48    Review of Systems  Unable to perform ROS: Dementia   Blood pressure 131/66, pulse 60, temperature 97.8 F (36.6 C), temperature source Axillary, resp. rate 16, SpO2 98 %. Physical Exam Constitutional:      General: She is not in acute distress. HENT:     Head: Normocephalic.     Mouth/Throat:     Mouth: Mucous membranes are moist.  Cardiovascular:     Rate and Rhythm: Normal rate.  Pulmonary:      Effort: Pulmonary effort is normal.  Abdominal:     Palpations: Abdomen is soft.  Musculoskeletal:     Cervical back: Neck supple.     Comments: Indicates pain to the right hip does not follow muscle or sensory exam commands.  No gross deformity of the legs bilaterally or arms bilaterally.  Skin:    General: Skin is warm.  Neurological:     Comments: dementia     Assessment/Plan: Patient has a right femoral neck fracture.  This appears to be subacute or potentially a displaced femoral neck stress fracture.  Attempt was made to obtain a CT scan or other imagings but the patient did not tolerate this in the ER she was agitated and in pain.  She was admitted to the hospital service.  Patient would benefit from surgical intervention for this given her baseline ambulatory status.  I had a lengthy conversation with the patient's daughter.  She is in agreement and would like to proceed with surgery.  Given the nature of this injury timing is important and therefore I have reached out to one of my partners Dr. Berenice Primas to see if he has availability to perform the surgery.  Plan is for surgery today.  Please keep patient n.p.o.  Patient does not take any anticoagulation at baseline.  I did discuss the risks, benefits and alternatives of surgery with the patient's family and these include but are not limited to wound healing complications, infection, need for further surgery, damage to surrounding structures and continued pain.  They understand these risks and would like to proceed with surgery.  Erle Crocker 07/08/2020, 8:04 AM

## 2020-07-08 NOTE — Progress Notes (Signed)
Initial Nutrition Assessment  DOCUMENTATION CODES:   Not applicable  INTERVENTION:  With diet advancement recommend: -Ensure Enlive po BID, each supplement provides 350 kcal and 20 grams of protein  -1 packet Juven BID, each packet provides 95 calories, 2.5 grams of protein (collagen), and 9.8 grams of carbohydrate (3 grams sugar); also contains 7 grams of L-arginine and L-glutamine, 300 mg vitamin C, 15 mg vitamin E, 1.2 mcg vitamin B-12, 9.5 mg zinc, 200 mg calcium, and 1.5 g  Calcium Beta-hydroxy-Beta-methylbutyrate to support post-op wound healing  -Obtain current weight as able to fully assess needs   NUTRITION DIAGNOSIS:   Increased nutrient needs related to post-op healing, hip fracture (Right femoral neck fracutre pending right hemiarthroplasty) as evidenced by estimated needs.   GOAL:   Patient will meet greater than or equal to 90% of their needs    MONITOR:   Weight trends, Labs, Diet advancement, PO intake, Supplement acceptance  REASON FOR ASSESSMENT:   Consult Assessment of nutrition requirement/status  ASSESSMENT:  RD working remotely.   86 year old female with past medical history significant for Alzheimer's dementia (oriented to self and occasionally family members at baseline), history of colon cancer, HTN, presented with right femoral neck fracture after fall at home.  Patient is currently in [place] for right hemiarthroplasty. Will monitor for diet advancement and order nutrition supplements as appropriate.   Weight on admission 103.84 lbs recorded 08/08/19, recommend obtaining current wt as able to fully assess needs.  Medications and labs reviewed  NUTRITION - FOCUSED PHYSICAL EXAM: Unable to complete at this time, RD working remotely.  Diet Order:   Diet Order            Diet NPO time specified  Diet effective now                 EDUCATION NEEDS:   No education needs have been identified at this time  Skin:  Skin Assessment: Skin  Integrity Issues: Skin Integrity Issues:: Incisions Incisions: closed;right hip  Last BM:  unknown  Height:   Ht Readings from Last 1 Encounters:  08/08/19 5\' 8"  (1.727 m)    Weight:   Wt Readings from Last 1 Encounters:  08/08/19 47.2 kg    Ideal Body Weight:  63.6 kg  BMI:  There is no height or weight on file to calculate BMI.  Estimated Nutritional Needs:   Kcal:  1415-1650  Protein:  71-83  Fluid:  >/= 1.4 L/day   Lajuan Lines, RD, LDN Clinical Nutrition After Hours/Weekend Pager # in [place]

## 2020-07-08 NOTE — Plan of Care (Signed)
  Problem: Skin Integrity: Goal: Risk for impaired skin integrity will decrease Outcome: Progressing   Problem: Safety: Goal: Ability to remain free from injury will improve Outcome: Progressing   Problem: Pain Managment: Goal: General experience of comfort will improve Outcome: Progressing   

## 2020-07-09 LAB — CBC
HCT: 28.4 % — ABNORMAL LOW (ref 36.0–46.0)
Hemoglobin: 9.5 g/dL — ABNORMAL LOW (ref 12.0–15.0)
MCH: 31.5 pg (ref 26.0–34.0)
MCHC: 33.5 g/dL (ref 30.0–36.0)
MCV: 94 fL (ref 80.0–100.0)
Platelets: 330 10*3/uL (ref 150–400)
RBC: 3.02 MIL/uL — ABNORMAL LOW (ref 3.87–5.11)
RDW: 13.9 % (ref 11.5–15.5)
WBC: 10 10*3/uL (ref 4.0–10.5)
nRBC: 0 % (ref 0.0–0.2)

## 2020-07-09 LAB — BASIC METABOLIC PANEL
Anion gap: 10 (ref 5–15)
BUN: 14 mg/dL (ref 8–23)
CO2: 23 mmol/L (ref 22–32)
Calcium: 8.5 mg/dL — ABNORMAL LOW (ref 8.9–10.3)
Chloride: 97 mmol/L — ABNORMAL LOW (ref 98–111)
Creatinine, Ser: 0.53 mg/dL (ref 0.44–1.00)
GFR calc Af Amer: >60 mL/min (ref >60)
GFR calc non Af Amer: >60 mL/min (ref >60)
Glucose, Bld: 156 mg/dL — ABNORMAL HIGH (ref 70–99)
Potassium: 3 mmol/L — ABNORMAL LOW (ref 3.5–5.1)
Sodium: 130 mmol/L — ABNORMAL LOW (ref 135–145)

## 2020-07-09 LAB — MAGNESIUM: Magnesium: 1.8 mg/dL (ref 1.7–2.4)

## 2020-07-09 MED ORDER — POTASSIUM CHLORIDE CRYS ER 20 MEQ PO TBCR
40.0000 meq | EXTENDED_RELEASE_TABLET | ORAL | Status: AC
  Administered 2020-07-09: 40 meq via ORAL
  Filled 2020-07-09: qty 2

## 2020-07-09 MED ORDER — IBUPROFEN 200 MG PO TABS
200.0000 mg | ORAL_TABLET | Freq: Four times a day (QID) | ORAL | Status: DC
  Administered 2020-07-10 – 2020-07-13 (×4): 200 mg via ORAL
  Filled 2020-07-09 (×6): qty 1

## 2020-07-09 MED ORDER — LIP MEDEX EX OINT
TOPICAL_OINTMENT | CUTANEOUS | Status: AC
  Filled 2020-07-09: qty 7

## 2020-07-09 MED ORDER — TRAMADOL HCL 50 MG PO TABS: 50.0000 mg | ORAL_TABLET | Freq: Four times a day (QID) | ORAL | Status: DC | PRN

## 2020-07-09 NOTE — TOC Initial Note (Addendum)
Transition of Care [facility]) - Initial/Assessment Note    Patient Details  Name: Anna Curtis MRN: [id number] Date of Birth: 1931/08/11  Transition of Care [facility]) CM/SW Contact:    Lia Hopping, [place] Phone Number: 07/09/2020, 2:52 PM  Clinical Narrative:      Patient admitted for right femoral neck fracture.            CSW met with the patient daughter/HCPOA Anna Curtis to discuss discharge plans. Daughter has requested to take the patient home with Home Health services. She reports the patient is currently active with [facility] for [facility] services. A nurse visits monthly. Daughter reports the patient lives in the home with her brother, nephew and  live nearby. Daughter reports prior to the surgical procedure she used a rolling walker and family assisted with her ADL's. She reports the patient has a hospital bed,  Bedside commode and shower chair. Daughter reports the patient will need a wheelchair.  CSW provided choice for home health. Daughter chose [facility] CSW reached out to agency to confirm staff availability.   Patient will need orders for DME wheelchair and Home Health.   TOC staff will continue to follow this patient.   Expected Discharge Plan: [place] Barriers to Discharge: Continued Medical Work up   Patient Goals and CMS Choice Patient states their goals for this hospitalization and ongoing recovery are:: Per daughter, "Return home." CMS Medicare.gov Compare Post Acute Care list provided to:: Patient Represenative (must comment) (Daughter Anna Curtis) Choice offered to / list presented to : [facility] POA / Guardian  Expected Discharge Plan and Services Expected Discharge Plan: [place] In-house Referral: Clinical Social Work   Post Acute Care Choice: [place] arrangements for the past 2 months: [place]: OT, Nurse's Aide, PT, RN [facility] Agency: [place]  ([facility]) Date [place]: 07/09/20 Time [place]: [place] Representative spoke with at [place]: Santiago Glad  Prior Living Arrangements/Services Living arrangements for the past 2 months: [place] with:: Adult Children Patient language and need for interpreter reviewed:: No Do you feel safe going back to the place where you live?: Yes      Need for Family Participation in Patient Care: Yes (Comment) Care giver support system in place?: Yes (comment) Current home services: DME Criminal Activity/Legal Involvement Pertinent to Current Situation/Hospitalization: No - Comment as needed  Activities of Daily Living Home Assistive Devices/Equipment: Built-in shower seat, Grab bars around toilet, Grab bars in shower, Dentures (specify type), Walker (specify type) ADL Screening (condition at time of admission) Patient's cognitive ability adequate to safely complete daily activities?: No Is the patient deaf or have difficulty hearing?: No Does the patient have difficulty seeing, even when wearing glasses/contacts?: No Does the patient have difficulty concentrating, remembering, or making decisions?: Yes Patient able to express need for assistance with ADLs?: No Does the patient have difficulty dressing or bathing?: Yes Independently performs ADLs?: No Communication: Independent Dressing (OT): Needs assistance Is this a change from baseline?: Change from baseline, expected to last >3 days Grooming: Needs assistance Is this a change from baseline?: Change from baseline, expected to last >3 days Feeding: Needs assistance Is this a change from baseline?: Change from baseline, expected to last >3 days Bathing: Needs assistance Is this a  change from baseline?: Change from baseline, expected to last >3 days Toileting: Dependent Is this a change from baseline?: Change from baseline, expected to last >3days In/Out Bed: Dependent Is this a change from baseline?: Change  from baseline, expected to last >3 days Walks in Home: Dependent Is this a change from baseline?: Change from baseline, expected to last >3 days Does the patient have difficulty walking or climbing stairs?: Yes Weakness of Legs: Right (R>L-currently has fx right hip) Weakness of Arms/Hands: None  Permission Sought/Granted Permission sought to share information with : Case Manager                Emotional Assessment Appearance:: Appears stated age Attitude/Demeanor/Rapport: Unable to Assess Affect (typically observed): Unable to Assess Orientation: : Oriented to Self Alcohol / Substance Use: Not Applicable Psych Involvement: No (comment)  Admission diagnosis:  Closed right hip fracture ([place]) [S72.001A] Closed fracture of right hip, initial encounter ([place]) [S72.001A] S/P total hip arthroplasty [Z96.649] Patient Active Problem List   Diagnosis Date Noted  . S/P total hip arthroplasty 07/08/2020  . Closed right hip fracture ([place]) 07/07/2020  . AMS (altered mental status) 07/07/2020  . Hospice care patient 10/25/2019  . Palliative care patient 10/25/2019  . Failure to thrive in adult 10/24/2019  . Severe protein-calorie malnutrition ([place]) 10/24/2019  . Decreased oral intake 10/24/2019  . Fall at home, initial encounter 10/23/2019  . Hypokalemia 10/23/2019  . Acute metabolic encephalopathy 07/17/8870  . Pressure injury of skin 10/23/2019  . Pain due to onychomycosis of toenails of both feet 10/15/2019  . Alzheimer's disease ([place]) 08/09/2019  . Agitation 08/09/2019  . Squamous cell carcinoma of anal canal ([place]) 08/08/2019  . Ataxia 06/08/2013  . Diplopia 06/08/2013  . Dizziness 06/08/2013  . Sinusitis 06/08/2013  . HTN (hypertension) 06/08/2013  . Tobacco abuse 06/08/2013   PCP:  Josetta Huddle, MD Pharmacy[facility]   [id number] [id number]: [phone]: [phone]    Social Determinants of  Health (SDOH) Interventions    Readmission Risk Interventions No flowsheet data found.

## 2020-07-09 NOTE — Progress Notes (Signed)
[facility] Documentation    Pt is a current pt in [facility] Palliative program. Liaison to follow pt through course of hospital stay to ensure follow up with Palliative NP upon discharge.     Please outreach with any questions.     Thank you,   Jennifer Love, RN  [facility] Hospital Liaison  [phone]   

## 2020-07-09 NOTE — Brief Op Note (Signed)
07/08/2020  6:56 AM  PATIENT:  Anna Curtis  86 y.o. female  PRE-OPERATIVE DIAGNOSIS:  fractured right hip  POST-OPERATIVE DIAGNOSIS:  fractured right hip  PROCEDURE:  Procedure(s): ARTHROPLASTY BIPOLAR HIP (HEMIARTHROPLASTY) (Right)  SURGEON:  Surgeon(s) and Role:    Dorna Leitz, MD - Primary  PHYSICIAN ASSISTANT:   ASSISTANTS: Filbert Berthold PAc   ANESTHESIA:   general  EBL:  50 mL   BLOOD ADMINISTERED:none  DRAINS: none   LOCAL MEDICATIONS USED:  [place]     SPECIMEN:  No Specimen  DISPOSITION OF SPECIMEN:  N/A  COUNTS:  YES  TOURNIQUET:  * No tourniquets in log *  DICTATION: .Other Dictation: Dictation Number [id number]  PLAN OF CARE: Admit to inpatient   PATIENT DISPOSITION:  PACU - hemodynamically stable.   Delay start of Pharmacological VTE agent (>24hrs) due to surgical blood loss or risk of bleeding: no

## 2020-07-09 NOTE — Progress Notes (Signed)
Verbal orders given by Debbe Odea, MD to continue one-on-one safety sitter.

## 2020-07-09 NOTE — Op Note (Signed)
NAME: HESTER, JOSLIN MEDICAL RECORD [id number] ACCOUNT [id number] DATE OF BIRTH:29-Jul-1931 FACILITY[facility]: [facility] PHYSICIAN:Lawton Dollinger Maudie Mercury, MD  OPERATIVE REPORT  DATE OF PROCEDURE:  07/08/2020  PREOPERATIVE DIAGNOSIS:  Femoral neck fracture, right.  POSTOPERATIVE DIAGNOSIS:  Femoral neck fracture, right.  PRINCIPAL PROCEDURE:  Right bipolar hemiarthroplasty.  SURGEON:  Dorna Leitz, MD  ASSISTANT:  Harmon Dun PA-C, was present for the entire case and assisted by manipulation of the leg, retraction, and closing to minimize OR time.  BRIEF HISTORY:  The patient is an 86 year old female, household ambulator who fell.  She was complaining of significant hip pain, was brought to the emergency room where she was noted to have a femoral neck fracture.  We talked to her and her family  about treatment options and felt that a hemiarthroplasty was the appropriate course of action.  She was brought to the operating room for this procedure.  The patient and family were well aware of the risks of bleeding, infection, dislocation, need for  further, and a slight risk of death at and around the time of surgery.  DESCRIPTION OF PROCEDURE:  The patient was taken to the operating room.  After adequate anesthesia was obtained with general anesthetic, the patient was placed supine on the operating table.  She was then moved into the left lateral decubitus position  and all bony prominences were well padded.  An axillary roll was put in place.  Attention was turned to the right hip where after routine prep and drape and incision was made for posterior approach to the hip, subcutaneous tissue dissected down to the  level of the tensor fascia.  Tensor fascia was divided in line with its fibers.  Dissection was taken down to the posterior structures and retractors were put in place above and below the hip.  Piriformis and posterior capsule were identified and tagged  and opened and following  this, hip was positioned and a provisional neck cut was made.  Following this, attention was turned towards the removal of the posterior ball, which was removed in total and sized on the back table to a 45.  At this time, the  attention was turned towards the stem where the cookie cutter was used followed by lateralizer, followed by sequential rasping.  We rasped up and trialed with a 3.  It kind of wiggled a little bit, went to a 4 and got an excellent fit and a 4 was opened  and placed.  The 45 bipolar ball was placed as it had been trialled on the 4 broach.  At this point, the ball was placed and the hip was reduced.  A final check had been made in the acetabulum to make sure there were not any loose fragmented pieces.   Once the ball had been placed the short external rotators and piriformis and posterior capsule were repaired to the posterior trochanteric line through drill holes.  The tensor fascia was closed with 1 Vicryl running, the skin with 2-0 Vicryl and 3-0  Monocryl subcuticular.  Benzoin and Steri-Strips were applied.  Sterile compressive dressing was applied.  Knee immobilizer was placed with no metallic structures and the patient was taken to recovery.  She was noted to be in satisfactory condition.   Estimated blood loss for procedure was 100 mL.  CN/NUANCE  D:07/09/2020 T:07/09/2020 JOB:[id number]

## 2020-07-09 NOTE — Progress Notes (Addendum)
PROGRESS NOTE    Anna Curtis   SAY:[id number]  DOB: 1931-08-08  DOA: 07/07/2020 PCP: Josetta Huddle, MD   Brief Narrative:  Anna Curtis is a 86 y.o. 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  Patient lives with her son and last week he noticed that she has been having more unsteady gait and pain to her right side.  Patient is followed by palliative care monthly and they thought that perhaps patient had a UTI initially prescribed her antibiotics last week.  However she continues to have unsteady gait and was able to ambulate freely before without assistance.  They presented to her primary care physician today and had x-ray showing right femoral neck fracture with impaction.  Son is not sure whether patient got up out of bed on her own in the morning and fell.  At baseline, patient is oriented to self and sometimes family member.  She is able to ambulate freely and able to feed herself.  Although needs help with cooking and bathing.   Subjective: No complaints.   Daughters are in the room and state they will take patient home from the hospital. We discusses pain medication regimen for home.  Assessment & Plan:   Principal Problem:   Closed right hip fracture (HCC) - s/p right hemiarthroplasty - start Ibuprofen and Tramadol for pain - dilaudid is sedating her too much and can be reserved for bedtime tonight -per daughters Morphine "isn't working".  Active Problems:   Alzheimer's disease ([place]) - with dementia - having behavioral disturbances- needs sitter at bedside as she is restless -she is followed by palliative care at home    H/o colon cancer  Time spent in minutes: 25 DVT prophylaxis: per ortho Code Status: DNR Family Communication: with daughters Disposition Plan:  Status is: Inpatient  Remains  inpatient appropriate because:Unsafe d/c plan   Dispo: The patient is from: Home              Anticipated d/c is to: Home              Anticipated d/c date is: 1 day              Patient currently is not medically stable to d/c.      Consultants:   ortho Procedures:    right hip hemiarthroplasty Antimicrobials:  Anti-infectives (From admission, onward)   Start     Dose/Rate Route Frequency Ordered Stop   07/08/20 1000  ceFAZolin (ANCEF) IVPB 2g/100 mL premix        2 g 200 mL/hr over 30 Minutes Intravenous On call to O.R. 07/08/20 0953 07/08/20 1058   07/08/20 0923  ceFAZolin (ANCEF) 2-4 GM/100ML-% IVPB       Note to Pharmacy: Mardelle Matte   : cabinet override      07/08/20 0923 07/08/20 1051       Objective: Vitals:   07/09/20 0736 07/09/20 0900 07/09/20 1131 07/09/20 1306  BP: 137/83  (!) 141/80   Pulse: 81  75   Resp: 18  18   Temp: 97.9 F (36.6 C)  (!) 97.3 F (36.3 C)   TempSrc: Oral  Oral   SpO2: 98%  98%   Weight:  47.9 kg    Height:   5\' 7"  (1.702 m) 5\' 7"  (1.702 m)    Intake/Output Summary (Last 24 hours) at 07/09/2020  2007 Last data filed at 07/09/2020 1736 Gross per 24 hour  Intake 1290 ml  Output 1100 ml  Net 190 ml   Filed Weights   07/09/20 0900  Weight: 47.9 kg    Examination: General exam: Appears comfortable  HEENT: PERRLA, oral mucosa moist, no sclera icterus or thrush Respiratory system: Clear to auscultation. Respiratory effort normal. Cardiovascular system: S1 & S2 heard,  No murmurs  Gastrointestinal system: Abdomen soft, non-tender, nondistended. Normal bowel sounds   Central nervous system: sleepy,  does not comply with exam Extremities: No cyanosis, clubbing or edema Skin: No rashes or ulcers Psychiatry:  confused and restless when awakened- Data Reviewed: I have personally reviewed following labs and imaging studies  CBC: Recent Labs  Lab 07/07/20 2006 07/09/20 0240  WBC 7.1 10.0  NEUTROABS 5.4  --   HGB 11.0*  9.5*  HCT 33.4* 28.4*  MCV 95.2 94.0  PLT 388 106   Basic Metabolic Panel: Recent Labs  Lab 07/07/20 2006 07/09/20 0240  NA 138 130*  K 4.5 3.0*  CL 104 97*  CO2 24 23  GLUCOSE 95 156*  BUN 17 14  CREATININE 0.80 0.53  CALCIUM 8.5* 8.5*  MG  --  1.8   GFR: Estimated Creatinine Clearance: 37.5 mL/min (by C-G formula based on SCr of 0.53 mg/dL). Liver Function Tests: No results for input(s): AST, ALT, ALKPHOS, BILITOT, PROT, ALBUMIN in the last 168 hours. No results for input(s): LIPASE, AMYLASE in the last 168 hours. No results for input(s): AMMONIA in the last 168 hours. Coagulation Profile: Recent Labs  Lab 07/07/20 2006  INR 1.0   Cardiac Enzymes: No results for input(s): CKTOTAL, CKMB, CKMBINDEX, TROPONINI in the last 168 hours. BNP (last 3 results) No results for input(s): PROBNP in the last 8760 hours. HbA1C: No results for input(s): HGBA1C in the last 72 hours. CBG: No results for input(s): GLUCAP in the last 168 hours. Lipid Profile: No results for input(s): CHOL, HDL, LDLCALC, TRIG, CHOLHDL, LDLDIRECT in the last 72 hours. Thyroid Function Tests: No results for input(s): TSH, T4TOTAL, FREET4, T3FREE, THYROIDAB in the last 72 hours. Anemia Panel: No results for input(s): VITAMINB12, FOLATE, FERRITIN, TIBC, IRON, RETICCTPCT in the last 72 hours. Urine analysis:    Component Value Date/Time   COLORURINE YELLOW 07/07/2020 1936   APPEARANCEUR CLEAR 07/07/2020 1936   LABSPEC 1.011 07/07/2020 1936   PHURINE 5.0 07/07/2020 1936   GLUCOSEU NEGATIVE 07/07/2020 1936   HGBUR MODERATE (A) 07/07/2020 1936   BILIRUBINUR NEGATIVE 07/07/2020 1936   KETONESUR NEGATIVE 07/07/2020 1936   PROTEINUR NEGATIVE 07/07/2020 1936   UROBILINOGEN 0.2 08/12/2015 1612   NITRITE NEGATIVE 07/07/2020 1936   LEUKOCYTESUR MODERATE (A) 07/07/2020 1936   Sepsis Labs: @LABRCNTIP (procalcitonin:4,lacticidven:4) ) Recent Results (from the past 240 hour(s))  Urine culture     Status:  Abnormal   Collection Time: 07/07/20  7:36 PM   Specimen: Urine, Clean Catch  Result Value Ref Range Status   Specimen Description   Final    URINE, CLEAN CATCH Performed at [facility], [place] [address] [place], [place] [address]    Special Requests   Final    NONE Performed at [facility], [place] [address], [place], [place] [address]    Culture MULTIPLE SPECIES PRESENT, SUGGEST RECOLLECTION (A)  Final   Report Status 07/08/2020 FINAL  Final  SARS Coronavirus 2 by RT PCR (hospital order, performed in [facility] hospital lab) Nasopharyngeal Nasopharyngeal Swab     Status:  None   Collection Time: 07/07/20  8:06 PM   Specimen: Nasopharyngeal Swab  Result Value Ref Range Status   SARS Coronavirus 2 NEGATIVE NEGATIVE Final    Comment: (NOTE) SARS-CoV-2 target nucleic acids are NOT DETECTED.  The SARS-CoV-2 RNA is generally detectable in upper and lower respiratory specimens during the acute phase of infection. The lowest concentration of SARS-CoV-2 viral copies this assay can detect is 250 copies / mL. A negative result does not preclude SARS-CoV-2 infection and should not be used as the sole basis for treatment or other patient management decisions.  A negative result may occur with improper specimen collection / handling, submission of specimen other than nasopharyngeal swab, presence of viral mutation(s) within the areas targeted by this assay, and inadequate number of viral copies (<250 copies / mL). A negative result must be combined with clinical observations, patient history, and epidemiological information.  Fact Sheet for Patients:   StrictlyIdeas.no  Fact Sheet for Healthcare Providers: BankingDealers.co.za  This test is not yet approved or  cleared by the [place] FDA and has been authorized for detection and/or diagnosis of SARS-CoV-2 by FDA under an Emergency Use Authorization  (EUA).  This EUA will remain in effect (meaning this test can be used) for the duration of the COVID-19 declaration under Section 564(b)(1) of the Act, 21 U.S.C. section 360bbb-3(b)(1), unless the authorization is terminated or revoked sooner.  Performed at [facility], [place] [address], [place], [place] [address]   Surgical pcr screen     Status: None   Collection Time: 07/08/20  9:07 AM   Specimen: Nasal Mucosa; Nasal Swab  Result Value Ref Range Status   MRSA, PCR NEGATIVE NEGATIVE Final   Staphylococcus aureus NEGATIVE NEGATIVE Final    Comment: (NOTE) The Xpert SA Assay (FDA approved for NASAL specimens in patients 66 years of age and older), is one component of a comprehensive surveillance program. It is not intended to diagnose infection nor to guide or monitor treatment. Performed at [facility], [place] [address], [place], [place] [address]          Radiology Studies: Pelvis Portable  Result Date: 07/08/2020 CLINICAL DATA:  Status post RIGHT hip arthroplasty. EXAM: PORTABLE PELVIS 1-2 VIEWS COMPARISON:  July 08, 2019 FINDINGS: Post RIGHT hip arthroplasty, osteopenia. The greater trochanter is incompletely imaged on submitted views. Visualized portions of the RIGHT proximal femur show no acute process. On AP view the femoral component appears well seated. Acetabular component within the acetabulum. Limited view of the pelvis excludes the upper margin of the iliac crest. No signs of pelvic fracture on submitted views which are limited by obliquity and field of view as discussed. IMPRESSION: 1. Post RIGHT hip arthroplasty without complicating features. 2. No signs of pelvic fracture on submitted views which are limited by field of view and obliquity. Given incomplete visualization of the greater trochanter a follow-up evaluation is suggested when the patient is able to include the RIGHT hip and greater trochanter. Gas in the soft tissues  adjacent to the hip compatible with postoperative change. Electronically Signed   By: Zetta Bills M.D.   On: 07/08/2020 13:44      Scheduled Meds: . aspirin EC  325 mg Oral BID  . Chlorhexidine Gluconate Cloth  6 each Topical Daily  . docusate sodium  100 mg Oral BID  . ferrous sulfate  325 mg Oral BID WC  . ibuprofen  200 mg Oral QID  . ondansetron (ZOFRAN) IV  4 mg Intravenous Once  . senna  1 tablet Oral BID   Continuous Infusions: . dextrose 5 % and 0.45% NaCl 75 mL/hr at 07/09/20 0052  . methocarbamol (ROBAXIN) IV 500 mg (07/08/20 1323)     LOS: 2 days      Debbe Odea, MD [facility] Pager: [email or web address] 07/09/2020, 8:07 PM

## 2020-07-09 NOTE — Progress Notes (Signed)
Rizwan, MD made aware pt refused PO meds. Pt continues to refuse meds at this time.

## 2020-07-09 NOTE — Evaluation (Signed)
Physical Therapy Evaluation Patient Details Name: Anna Curtis MRN: [id number] DOB: 08/09/1931 Today's Date: 07/09/2020   History of Present Illness  86 y.o. female with medical history significant for Alzheimer's dementia, history of squamous cell cancer of the anal canal, hypertension and protein calorie malnutrition who presents with right femoral neck fracture and s/p right hip hemiarthroplasty 07/08/20  Clinical Impression  Patient is s/p above surgery resulting in functional limitations due to the deficits listed below (see PT Problem List).  Patient will benefit from skilled PT to increase their independence and safety with mobility to allow discharge to the venue listed below.  Pt ambulatory without assistive device and follows simple functional commands at baseline.  Pt resistant to movement due to pain today and requiring +2 assist.  Pt also with posterior hip precautions which pt needs assist maintaining.  Daughters present initially however stepped out during session.  They plan for pt to return home and are interested in any increased home care options and HHPT.     Follow Up Recommendations Supervision/Assistance - 24 hour;Home health PT    Equipment Recommendations  3in1 (PT)    Recommendations for Other Services       Precautions / Restrictions Precautions Precautions: Posterior Hip;Fall Required Braces or Orthoses: Knee Immobilizer - Right Restrictions Weight Bearing Restrictions: No      Mobility  Bed Mobility Overal bed mobility: Needs Assistance Bed Mobility: Supine to Sit;Sit to Supine     Supine to sit: Total assist;+2 for physical assistance Sit to supine: Total assist;+2 for physical assistance   General bed mobility comments: pt attempting to assist however would stop due to pain, requiring +2 assist for safety and positioning, pt attempting to return supine multiple times despite attempt at distraction so assisted back to bed for safety especially due to  posterior hip precautions  Transfers                 General transfer comment: pt not willing  Ambulation/Gait                Stairs            Wheelchair Mobility    Modified Rankin (Stroke Patients Only)       Balance Overall balance assessment: History of Falls                                           Pertinent Vitals/Pain Pain Assessment: Faces Faces Pain Scale: Hurts even more Pain Location: left hip Pain Descriptors / Indicators: Grimacing;Guarding Pain Intervention(s): Repositioned;Monitored during session;Premedicated before session (RN reports pt has been receiving IV pain meds and not yet time)    Home Living Family/patient expects to be discharged to:: Private residence Living Arrangements: Children Available Help at Discharge: Family;Available 24 hours/day Type of Home: House Home Access: Level entry     Home Layout: One level Home Equipment: Walker - 2 wheels;Hospital bed      Prior Function Level of Independence: Needs assistance   Gait / Transfers Assistance Needed: ambulatory without assistive device  ADL's / Homemaking Assistance Needed: family assists as needed  Comments: responds well to calm voices - from previous admission     Hand Dominance        Extremity/Trunk Assessment        Lower Extremity Assessment Lower Extremity Assessment: RLE deficits/detail;Difficult to assess due to impaired cognition RLE  Deficits / Details: requiring assist due to pain    Cervical / Trunk Assessment Cervical / Trunk Assessment: Normal  Communication   Communication: No difficulties  Cognition Arousal/Alertness: Awake/alert Behavior During Therapy: Restless Overall Cognitive Status: History of cognitive impairments - at baseline                                        General Comments      Exercises     Assessment/Plan    PT Assessment Patient needs continued PT services  PT  Problem List Decreased strength;Decreased mobility;Decreased activity tolerance;Decreased balance;Decreased knowledge of use of DME;Pain;Decreased cognition;Decreased knowledge of precautions       PT Treatment Interventions DME instruction;Gait training;Balance training;Therapeutic exercise;Functional mobility training;Therapeutic activities;Patient/family education;Wheelchair mobility training    PT Goals (Current goals can be found in the Care Plan section)  Acute Rehab PT Goals Patient Stated Goal: family plans for pt to return home PT Goal Formulation: With patient/family Time For Goal Achievement: 07/23/20 Potential to Achieve Goals: Fair    Frequency Min 5X/week   Barriers to discharge        Co-evaluation               AM-PAC PT "6 Clicks" Mobility  Outcome Measure Help needed turning from your back to your side while in a flat bed without using bedrails?: Total Help needed moving from lying on your back to sitting on the side of a flat bed without using bedrails?: Total Help needed moving to and from a bed to a chair (including a wheelchair)?: Total Help needed standing up from a chair using your arms (e.g., wheelchair or bedside chair)?: Total Help needed to walk in hospital room?: Total Help needed climbing 3-5 steps with a railing? : Total 6 Click Score: 6    End of Session Equipment Utilized During Treatment: Right knee immobilizer Activity Tolerance: Patient limited by pain Patient left: in bed;with call bell/phone within reach;with nursing/sitter in room Nurse Communication: Mobility status PT Visit Diagnosis: History of falling (Z91.81);Other abnormalities of gait and mobility (R26.89)    Time: 5284-1324 PT Time Calculation (min) (ACUTE ONLY): 16 min   Charges:   PT Evaluation $PT Eval Low Complexity: 1 Low        Kati PT, DPT Acute Rehabilitation Services Pager: [phone] Office: [phone]  York Ram E 07/09/2020, 11:57 AM

## 2020-07-10 LAB — CBC
HCT: 30.5 % — ABNORMAL LOW (ref 36.0–46.0)
Hemoglobin: 10.1 g/dL — ABNORMAL LOW (ref 12.0–15.0)
MCH: 30.6 pg (ref 26.0–34.0)
MCHC: 33.1 g/dL (ref 30.0–36.0)
MCV: 92.4 fL (ref 80.0–100.0)
Platelets: 370 10*3/uL (ref 150–400)
RBC: 3.3 MIL/uL — ABNORMAL LOW (ref 3.87–5.11)
RDW: 13.8 % (ref 11.5–15.5)
WBC: 12.6 10*3/uL — ABNORMAL HIGH (ref 4.0–10.5)
nRBC: 0 % (ref 0.0–0.2)

## 2020-07-10 MED ORDER — HYDROMORPHONE HCL 1 MG/ML IJ SOLN
0.5000 mg | INTRAMUSCULAR | Status: DC | PRN
  Administered 2020-07-10 – 2020-07-11 (×5): 0.5 mg via INTRAVENOUS
  Filled 2020-07-10 (×5): qty 0.5

## 2020-07-10 MED ORDER — KETOROLAC TROMETHAMINE 30 MG/ML IJ SOLN
30.0000 mg | Freq: Once | INTRAMUSCULAR | Status: AC
  Administered 2020-07-10: 30 mg via INTRAVENOUS
  Filled 2020-07-10: qty 1

## 2020-07-10 MED ORDER — HYDROMORPHONE HCL 1 MG/ML IJ SOLN: 1.0000 mg | Freq: Every day | INTRAMUSCULAR | Status: DC

## 2020-07-10 MED ORDER — HALOPERIDOL LACTATE 5 MG/ML IJ SOLN
1.0000 mg | Freq: Four times a day (QID) | INTRAMUSCULAR | Status: DC | PRN
  Administered 2020-07-10: 1 mg via INTRAVENOUS
  Filled 2020-07-10: qty 1

## 2020-07-10 NOTE — Progress Notes (Cosign Needed)
    Durable Medical Equipment  (From admission, onward)         Start     Ordered   07/10/20 [phone]  For home use only DME standard manual wheelchair with seat cushion  Once       Comments: Patient suffers from a hip fracture which impairs their ability to perform daily activities like dressing in the home.  A walker will not resolve issue with performing activities of daily living. A wheelchair will allow patient to safely perform daily activities. Patient can safely propel the wheelchair in the home or has a caregiver who can provide assistance. Length of need Lifetime. Accessories: elevating leg rests (ELRs), wheel locks, extensions and anti-tippers.   07/10/20 8337

## 2020-07-10 NOTE — Plan of Care (Signed)
  Problem: Education: Goal: Knowledge of General Education information will improve Description: Including pain rating scale, medication(s)/side effects and non-pharmacologic comfort measures Outcome: Progressing   Problem: Pain Managment: Goal: General experience of comfort will improve Outcome: Progressing   

## 2020-07-10 NOTE — Progress Notes (Addendum)
Pt in pain, crying. Refuses to take PO meds. Pt's Daughters present at bedside. Daughters requesting IV pain meds. No active PRN order for IV pain meds. Debbe Odea, MD notified.  Verbal orders given for one time dose of IV Toradol 30 mg.

## 2020-07-10 NOTE — Progress Notes (Signed)
Pt refused PO meds. Refusing to eat/drink as well. Debbe Odea, MD notified.

## 2020-07-10 NOTE — TOC Progression Note (Signed)
Transition of Care [facility]) - Progression Note    Patient Details  Name: Anna Curtis MRN: [id number] Date of Birth: November 23, 1931  Transition of Care [facility]) CM/SW [place], [address] Phone Number: 07/10/2020, 9:59 AM  Clinical Narrative:    Wheelchair ordered through [place]    Expected Discharge Plan: [place] Barriers to Discharge: Continued Medical Work up  Expected Discharge Plan and Services Expected Discharge Plan: [place] In-house Referral: Clinical Social Work   Post Acute Care Choice: [place] arrangements for the past 2 months: [place]                 DME Arranged: Wheelchair manual DME Agency: [employer] Date DME Agency Contacted: 07/10/20 Time DME Agency Contacted: [phone] Representative spoke with at DME Agency: Thedore Mins HH Arranged: OT, Nurse's Aide, PT, RN [facility] Agency: [place] ([place]) Date [place]: 07/09/20 Time [place]: 1443 Representative spoke with at [place]: [place] ([place]) Interventions    Readmission Risk Interventions No flowsheet data found.

## 2020-07-10 NOTE — Plan of Care (Signed)
  Problem: Education: Goal: Knowledge of General Education information will improve Description: Including pain rating scale, medication(s)/side effects and non-pharmacologic comfort measures Outcome: Progressing   Problem: Coping: Goal: Level of anxiety will decrease Outcome: Progressing   Problem: Pain Managment: Goal: General experience of comfort will improve Outcome: Progressing   

## 2020-07-10 NOTE — Progress Notes (Signed)
PT Cancellation Note  Patient Details Name: Anna Curtis MRN: [id number] DOB: 1931-09-01   Cancelled Treatment:     PT attempted x 3 this am, deferred initially with pt asleep after difficult night, on second and third attempt pt too lethargic to participate - pt had received Haldol.  Will follow.   Rabon Scholle 07/10/2020, 12:37 PM

## 2020-07-10 NOTE — Progress Notes (Signed)
Physical Therapy Treatment Patient Details Name: Anna Curtis MRN: [id number] DOB: [birth year] Today's Date: 07/10/2020    History of Present Illness 86 y.o. female with medical history significant for Alzheimer's dementia, history of squamous cell cancer of the anal canal, hypertension and protein calorie malnutrition who presents with right femoral neck fracture and s/p right hip hemiarthroplasty 07/08/20    PT Comments    Pt continues ltd by increased confusion and following min cues but was able to move to bedside sitting with significant assist and then maintain balance with min guard for safety.  Pt stood twice with significant assist of two but tolerated <30seconds each time before attempting to sit and returned to EOB.   Follow Up Recommendations  Supervision/Assistance - 24 hour;Home health PT     Equipment Recommendations  3in1 (PT)    Recommendations for Other Services       Precautions / Restrictions Precautions Precautions: Posterior Hip;Fall Required Braces or Orthoses: Knee Immobilizer - Right Knee Immobilizer - Right: On at all times Restrictions Weight Bearing Restrictions: No Other Position/Activity Restrictions: WBAT    Mobility  Bed Mobility Overal bed mobility: Needs Assistance Bed Mobility: Supine to Sit;Sit to Supine     Supine to sit: +2 for physical assistance;Max assist Sit to supine: Mod assist;+2 for physical assistance;+2 for safety/equipment   General bed mobility comments: Pt assisted to EOB sitting with use of bed pad.  Pt following min cues but assisted to pull self up to sitting  Transfers Overall transfer level: Needs assistance Equipment used: Rolling walker (2 wheeled) Transfers: Sit to/from Stand Sit to Stand: Mod assist;Max assist;+2 physical assistance;From elevated surface         General transfer comment: Multimodal cues, increased time and physical assist x 2 to achieve standing.  Pt stood twice but with only 30 second  tolerance before attempting to sit and returned to EOB  Ambulation/Gait             General Gait Details: pt unable to initiate step at this time 2* ongoing confusion   Stairs             Wheelchair Mobility    Modified Rankin (Stroke Patients Only)       Balance Overall balance assessment: History of Falls                                          Cognition Arousal/Alertness: Awake/alert Behavior During Therapy: Restless;Anxious;Flat affect;Impulsive Overall Cognitive Status: History of cognitive impairments - at baseline                                 General Comments: Family present and report pt with increased confusion vs normal state      Exercises Total Joint Exercises Heel Slides: AAROM;Right;10 reps;Supine    General Comments        Pertinent Vitals/Pain Pain Assessment: Faces Faces Pain Scale: Hurts little more Pain Location: left hip Pain Descriptors / Indicators: Grimacing;Guarding Pain Intervention(s): Monitored during session;Limited activity within patient's tolerance;Premedicated before session    Home Living                      Prior Function            PT Goals (current goals can now be found in the  care plan section) Acute Rehab PT Goals Patient Stated Goal: family plans for pt to return home PT Goal Formulation: With patient/family Time For Goal Achievement: 07/23/20 Potential to Achieve Goals: Fair Progress towards PT goals: Progressing toward goals    Frequency    Min 5X/week      PT Plan Current plan remains appropriate    Co-evaluation              AM-PAC PT "6 Clicks" Mobility   Outcome Measure  Help needed turning from your back to your side while in a flat bed without using bedrails?: Total Help needed moving from lying on your back to sitting on the side of a flat bed without using bedrails?: A Lot Help needed moving to and from a bed to a chair (including  a wheelchair)?: Total Help needed standing up from a chair using your arms (e.g., wheelchair or bedside chair)?: A Lot Help needed to walk in hospital room?: Total Help needed climbing 3-5 steps with a railing? : Total 6 Click Score: 8    End of Session Equipment Utilized During Treatment: Right knee immobilizer;Gait belt Activity Tolerance: Other (comment) (ltd by confusion) Patient left: in bed;with call bell/phone within reach;with nursing/sitter in room Nurse Communication: Mobility status PT Visit Diagnosis: History of falling (Z91.81);Other abnormalities of gait and mobility (R26.89)     Time: 1550-1620 PT Time Calculation (min) (ACUTE ONLY): 30 min  Charges:  $Therapeutic Activity: 23-37 mins                     Debe Coder PT Acute Rehabilitation Services Pager [phone] Office [phone]    Kary Sugrue 07/10/2020, 5:05 PM

## 2020-07-10 NOTE — Progress Notes (Signed)
PROGRESS NOTE    [name]   UYQ:[id number]  DOB: 1931/09/10  DOA: 07/07/2020 PCP: Josetta Huddle, MD   Brief Narrative:  Anna Curtis is a 86 y.o. 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  Patient lives with her son and last week he noticed that she has been having more unsteady gait and pain to her right side.  Patient is followed by palliative care monthly and they thought that perhaps patient had a UTI initially prescribed her antibiotics last week.  However she continues to have unsteady gait and was able to ambulate freely before without assistance.  They presented to her primary care physician today and had x-ray showing right femoral neck fracture with impaction.  Son is not sure whether patient got up out of bed on her own in the morning and fell.  At baseline, patient is oriented to self and sometimes family member.  She is able to ambulate freely and able to feed herself.  Although needs help with cooking and bathing.   Subjective: Very agitated this AM. Given Haldol 1 mg and then Dilaudid for pain and now asleep.   Assessment & Plan:   Principal Problem:   Closed right hip fracture (HCC) - s/p right hemiarthroplasty - start Ibuprofen and Tramadol for pain - dilaudid is sedating her too much and can be reserved for bedtime tonight- I discussed this with the RN yesterday -per daughters Morphine "isn't working". - given Dilaudid just now and thus oversedated- will change to "at bedtime only"   Active Problems:   Alzheimer's disease ([place]) - with dementia - having behavioral disturbances- needs sitter at bedside as she is restless- started 1 mg of Haldol PRN today -she is followed by palliative care at home   Nutrition - still not eating or drinking- not working with PT either   H/o colon  cancer  Time spent in minutes: 25 DVT prophylaxis: per ortho Code Status: DNR Family Communication: with daughters Disposition Plan:  Status is: Inpatient  Remains inpatient appropriate because:Unsafe d/c plan   Dispo: The patient is from: Home              Anticipated d/c is to: Home              Anticipated d/c date is: 1 day              Patient currently is not medically stable to d/c.      Consultants:   ortho Procedures:    right hip hemiarthroplasty Antimicrobials:  Anti-infectives (From admission, onward)   Start     Dose/Rate Route Frequency Ordered Stop   07/08/20 1000  ceFAZolin (ANCEF) IVPB 2g/100 mL premix        2 g 200 mL/hr over 30 Minutes Intravenous On call to O.R. 07/08/20 0953 07/08/20 1058   07/08/20 0923  ceFAZolin (ANCEF) 2-4 GM/100ML-% IVPB       Note to Pharmacy: Mardelle Matte   : cabinet override      07/08/20 0923 07/08/20 1051       Objective: Vitals:   07/09/20 1306 07/09/20 2100 07/10/20 0549 07/10/20 1333  BP:  (!) 151/99 (!) 160/78 (!) 168/71  Pulse:  100 79 72  Resp:  18 18 19   Temp:    98.4 F (36.9 C)  TempSrc:    Axillary  SpO2:  98%  Weight:      Height: 5\' 7"  (1.702 m)       Intake/Output Summary (Last 24 hours) at 07/10/2020 1402 Last data filed at 07/10/2020 1333 Gross per 24 hour  Intake 1569.43 ml  Output --  Net 1569.43 ml   Filed Weights   07/09/20 0900  Weight: 47.9 kg    Examination:  Respiratory system: Clear to auscultation. Respiratory effort normal. Cardiovascular system: S1 & S2 heard,  No murmurs  Gastrointestinal system: Abdomen soft, non-tender, nondistended. Normal bowel sounds   Extremities: No cyanosis, clubbing or edema Skin: No rashes or ulcers Psychiatry:  agitated this AM   Data Reviewed: I have personally reviewed following labs and imaging studies  CBC: Recent Labs  Lab 07/07/20 2006 07/09/20 0240 07/10/20 0836  WBC 7.1 10.0 12.6*  NEUTROABS 5.4  --   --   HGB 11.0*  9.5* 10.1*  HCT 33.4* 28.4* 30.5*  MCV 95.2 94.0 92.4  PLT 388 330 124   Basic Metabolic Panel: Recent Labs  Lab 07/07/20 2006 07/09/20 0240  NA 138 130*  K 4.5 3.0*  CL 104 97*  CO2 24 23  GLUCOSE 95 156*  BUN 17 14  CREATININE 0.80 0.53  CALCIUM 8.5* 8.5*  MG  --  1.8   GFR: Estimated Creatinine Clearance: 37.5 mL/min (by C-G formula based on SCr of 0.53 mg/dL). Liver Function Tests: No results for input(s): AST, ALT, ALKPHOS, BILITOT, PROT, ALBUMIN in the last 168 hours. No results for input(s): LIPASE, AMYLASE in the last 168 hours. No results for input(s): AMMONIA in the last 168 hours. Coagulation Profile: Recent Labs  Lab 07/07/20 2006  INR 1.0   Cardiac Enzymes: No results for input(s): CKTOTAL, CKMB, CKMBINDEX, TROPONINI in the last 168 hours. BNP (last 3 results) No results for input(s): PROBNP in the last 8760 hours. HbA1C: No results for input(s): HGBA1C in the last 72 hours. CBG: No results for input(s): GLUCAP in the last 168 hours. Lipid Profile: No results for input(s): CHOL, HDL, LDLCALC, TRIG, CHOLHDL, LDLDIRECT in the last 72 hours. Thyroid Function Tests: No results for input(s): TSH, T4TOTAL, FREET4, T3FREE, THYROIDAB in the last 72 hours. Anemia Panel: No results for input(s): VITAMINB12, FOLATE, FERRITIN, TIBC, IRON, RETICCTPCT in the last 72 hours. Urine analysis:    Component Value Date/Time   COLORURINE YELLOW 07/07/2020 1936   APPEARANCEUR CLEAR 07/07/2020 1936   LABSPEC 1.011 07/07/2020 1936   PHURINE 5.0 07/07/2020 1936   GLUCOSEU NEGATIVE 07/07/2020 1936   HGBUR MODERATE (A) 07/07/2020 1936   BILIRUBINUR NEGATIVE 07/07/2020 1936   KETONESUR NEGATIVE 07/07/2020 1936   PROTEINUR NEGATIVE 07/07/2020 1936   UROBILINOGEN 0.2 08/12/2015 1612   NITRITE NEGATIVE 07/07/2020 1936   LEUKOCYTESUR MODERATE (A) 07/07/2020 1936   Sepsis Labs: @LABRCNTIP (procalcitonin:4,lacticidven:4) ) Recent Results (from the past 240 hour(s))  Urine  culture     Status: Abnormal   Collection Time: 07/07/20  7:36 PM   Specimen: Urine, Clean Catch  Result Value Ref Range Status   Specimen Description   Final    URINE, CLEAN CATCH Performed at [facility], [place] [address] [place], [place] [address]    Special Requests   Final    NONE Performed at [facility], [place] [address], [place], [place] [address]    Culture MULTIPLE SPECIES PRESENT, SUGGEST RECOLLECTION (A)  Final   Report Status 07/08/2020 FINAL  Final  SARS Coronavirus 2 by RT PCR (hospital order, performed in [facility] hospital lab) Nasopharyngeal Nasopharyngeal Swab     Status: None   Collection Time: 07/07/20  8:06 PM   Specimen: Nasopharyngeal Swab  Result Value Ref Range Status   SARS Coronavirus 2 NEGATIVE NEGATIVE Final    Comment: (NOTE) SARS-CoV-2 target nucleic acids are NOT DETECTED.  The SARS-CoV-2 RNA is generally detectable in upper and lower respiratory specimens during the acute phase of infection. The lowest concentration of SARS-CoV-2 viral copies this assay can detect is 250 copies / mL. A negative result does not preclude SARS-CoV-2 infection and should not be used as the sole basis for treatment or other patient management decisions.  A negative result may occur with improper specimen collection / handling, submission of specimen other than nasopharyngeal swab, presence of viral mutation(s) within the areas targeted by this assay, and inadequate number of viral copies (<250 copies / mL). A negative result must be combined with clinical observations, patient history, and epidemiological information.  Fact Sheet for Patients:   StrictlyIdeas.no  Fact Sheet for Healthcare Providers: BankingDealers.co.za  This test is not yet approved or  cleared by the [place] FDA and has been authorized for detection and/or diagnosis of SARS-CoV-2 by FDA under an Emergency  Use Authorization (EUA).  This EUA will remain in effect (meaning this test can be used) for the duration of the COVID-19 declaration under Section 564(b)(1) of the Act, 21 U.S.C. section 360bbb-3(b)(1), unless the authorization is terminated or revoked sooner.  Performed at [facility], [place] [address], [place], [place] [address]   Surgical pcr screen     Status: None   Collection Time: 07/08/20  9:07 AM   Specimen: Nasal Mucosa; Nasal Swab  Result Value Ref Range Status   MRSA, PCR NEGATIVE NEGATIVE Final   Staphylococcus aureus NEGATIVE NEGATIVE Final    Comment: (NOTE) The Xpert SA Assay (FDA approved for NASAL specimens in patients 33 years of age and older), is one component of a comprehensive surveillance program. It is not intended to diagnose infection nor to guide or monitor treatment. Performed at [facility], [place] [address], [place], [place] [address]          Radiology Studies: No results found.    Scheduled Meds: . aspirin EC  325 mg Oral BID  . Chlorhexidine Gluconate Cloth  6 each Topical Daily  . docusate sodium  100 mg Oral BID  . ferrous sulfate  325 mg Oral BID WC  .  HYDROmorphone (DILAUDID) injection  1 mg Intravenous QHS  . ibuprofen  200 mg Oral QID  . ondansetron (ZOFRAN) IV  4 mg Intravenous Once  . senna  1 tablet Oral BID   Continuous Infusions: . dextrose 5 % and 0.45% NaCl 75 mL/hr at 07/10/20 0300     LOS: 3 days      Debbe Odea, MD [facility] Pager: [email or web address] 07/10/2020, 2:02 PM

## 2020-07-11 LAB — BASIC METABOLIC PANEL
Anion gap: 9 (ref 5–15)
BUN: 7 mg/dL — ABNORMAL LOW (ref 8–23)
CO2: 29 mmol/L (ref 22–32)
Calcium: 8 mg/dL — ABNORMAL LOW (ref 8.9–10.3)
Chloride: 91 mmol/L — ABNORMAL LOW (ref 98–111)
Creatinine, Ser: 0.43 mg/dL — ABNORMAL LOW (ref 0.44–1.00)
GFR calc Af Amer: >60 mL/min (ref >60)
GFR calc non Af Amer: >60 mL/min (ref >60)
Glucose, Bld: 145 mg/dL — ABNORMAL HIGH (ref 70–99)
Potassium: 2.3 mmol/L — CL (ref 3.5–5.1)
Sodium: 129 mmol/L — ABNORMAL LOW (ref 135–145)

## 2020-07-11 LAB — CBC
HCT: 29.9 % — ABNORMAL LOW (ref 36.0–46.0)
Hemoglobin: 10.1 g/dL — ABNORMAL LOW (ref 12.0–15.0)
MCH: 31.1 pg (ref 26.0–34.0)
MCHC: 33.8 g/dL (ref 30.0–36.0)
MCV: 92 fL (ref 80.0–100.0)
Platelets: 367 10*3/uL (ref 150–400)
RBC: 3.25 MIL/uL — ABNORMAL LOW (ref 3.87–5.11)
RDW: 13.7 % (ref 11.5–15.5)
WBC: 9.7 10*3/uL (ref 4.0–10.5)
nRBC: 0 % (ref 0.0–0.2)

## 2020-07-11 MED ORDER — ENSURE ENLIVE PO LIQD: 237.0000 mL | Freq: Three times a day (TID) | ORAL | Status: DC

## 2020-07-11 MED ORDER — POTASSIUM CHLORIDE 20 MEQ/15ML (10%) PO SOLN
40.0000 meq | Freq: Two times a day (BID) | ORAL | Status: AC
  Administered 2020-07-11 (×2): 40 meq via ORAL
  Filled 2020-07-11 (×3): qty 30

## 2020-07-11 MED ORDER — POTASSIUM CHLORIDE IN NACL 40-0.9 MEQ/L-% IV SOLN
INTRAVENOUS | Status: DC
  Filled 2020-07-11 (×3): qty 1000

## 2020-07-11 NOTE — Progress Notes (Addendum)
PROGRESS NOTE    Anna Curtis   GXQ:[id number]  DOB: 06-Aug-1931  DOA: 07/07/2020 PCP: Josetta Huddle, MD   Brief Narrative:  Anna Curtis is a 86 y.o. 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  Patient lives with her son and last week he noticed that she has been having more unsteady gait and pain to her right side.  Patient is followed by palliative care monthly and they thought that perhaps patient had a UTI initially prescribed her antibiotics last week.  However she continues to have unsteady gait and was able to ambulate freely before without assistance.  They presented to her primary care physician today and had x-ray showing right femoral neck fracture with impaction.  Son is not sure whether patient got up out of bed on her own in the morning and fell.  At baseline, patient is oriented to self and sometimes family member.  She is able to ambulate freely and able to feed herself.  Although needs help with cooking and bathing.   Subjective: Less agitated per daughters but still not eating much or drinking much.   Assessment & Plan:   Principal Problem:   Closed right hip fracture (HCC) - s/p right hemiarthroplasty - start Ibuprofen and Tramadol for pain - dilaudid is sedating her too much and can be reserved for bedtime tonight- I discussed this with the RN yesterday -per daughters Morphine "isn't working". - given Dilaudid just now and thus oversedated- will change to "at bedtime only"   Active Problems: Hyponatremia - due to poor oral intake-  change fluids from D51/2 NS to NS and recheck in AM    Alzheimer's disease ([place]) - with dementia - having behavioral disturbances- no medications help- needs sitter at bedside as she is restless-  -she is followed by palliative care at home   Nutrition - still  not eating or drinking- not working with PT either   H/o colon cancer  Time spent in minutes: 25 DVT prophylaxis: per ortho Code Status: DNR Family Communication: with daughters Disposition Plan:  Status is: Inpatient  Remains inpatient appropriate because:Unsafe d/c plan- not eating or drinking yet. Has not had enough PT.    Dispo: The patient is from: Home              Anticipated d/c is to: Home              Anticipated d/c date is: 1 day              Patient currently is not medically stable to d/c.      Consultants:   ortho Procedures:    right hip hemiarthroplasty Antimicrobials:  Anti-infectives (From admission, onward)   Start     Dose/Rate Route Frequency Ordered Stop   07/08/20 1000  ceFAZolin (ANCEF) IVPB 2g/100 mL premix        2 g 200 mL/hr over 30 Minutes Intravenous On call to O.R. 07/08/20 0953 07/08/20 1058   07/08/20 0923  ceFAZolin (ANCEF) 2-4 GM/100ML-% IVPB       Note to Pharmacy: Mardelle Matte   : cabinet override      07/08/20 0923 07/08/20 1051       Objective: Vitals:   07/10/20 0549 07/10/20 1333 07/10/20 2159 07/11/20 0607  BP: (!) 160/78 (!) 168/71 (!) 177/72 (!) 194/79  Pulse: 79 72 87 (!)  105  Resp: 18 19    Temp:  98.4 F (36.9 C)    TempSrc:  Axillary    SpO2:  98%    Weight:      Height:        Intake/Output Summary (Last 24 hours) at 07/11/2020 1342 Last data filed at 07/11/2020 0900 Gross per 24 hour  Intake 1075.65 ml  Output --  Net 1075.65 ml   Filed Weights   07/09/20 0900  Weight: 47.9 kg    Examination:  Respiratory system: Clear to auscultation. Respiratory effort normal. Cardiovascular system: S1 & S2 heard,  No murmurs  Gastrointestinal system: Abdomen soft, non-tender, nondistended. Normal bowel sounds   Extremities: No cyanosis, clubbing or edema Skin: No rashes or ulcers Psychiatry:  agitated again this AM   Data Reviewed: I have personally reviewed following labs and imaging  studies  CBC: Recent Labs  Lab 07/07/20 2006 07/09/20 0240 07/10/20 0836 07/11/20 0310  WBC 7.1 10.0 12.6* 9.7  NEUTROABS 5.4  --   --   --   HGB 11.0* 9.5* 10.1* 10.1*  HCT 33.4* 28.4* 30.5* 29.9*  MCV 95.2 94.0 92.4 92.0  PLT 388 330 370 941   Basic Metabolic Panel: Recent Labs  Lab 07/07/20 2006 07/09/20 0240 07/11/20 0910  NA 138 130* 129*  K 4.5 3.0* 2.3*  CL 104 97* 91*  CO2 24 23 29   GLUCOSE 95 156* 145*  BUN 17 14 7*  CREATININE 0.80 0.53 0.43*  CALCIUM 8.5* 8.5* 8.0*  MG  --  1.8  --    GFR: Estimated Creatinine Clearance: 37.5 mL/min (A) (by C-G formula based on SCr of 0.43 mg/dL (L)). Liver Function Tests: No results for input(s): AST, ALT, ALKPHOS, BILITOT, PROT, ALBUMIN in the last 168 hours. No results for input(s): LIPASE, AMYLASE in the last 168 hours. No results for input(s): AMMONIA in the last 168 hours. Coagulation Profile: Recent Labs  Lab 07/07/20 2006  INR 1.0   Cardiac Enzymes: No results for input(s): CKTOTAL, CKMB, CKMBINDEX, TROPONINI in the last 168 hours. BNP (last 3 results) No results for input(s): PROBNP in the last 8760 hours. HbA1C: No results for input(s): HGBA1C in the last 72 hours. CBG: No results for input(s): GLUCAP in the last 168 hours. Lipid Profile: No results for input(s): CHOL, HDL, LDLCALC, TRIG, CHOLHDL, LDLDIRECT in the last 72 hours. Thyroid Function Tests: No results for input(s): TSH, T4TOTAL, FREET4, T3FREE, THYROIDAB in the last 72 hours. Anemia Panel: No results for input(s): VITAMINB12, FOLATE, FERRITIN, TIBC, IRON, RETICCTPCT in the last 72 hours. Urine analysis:    Component Value Date/Time   COLORURINE YELLOW 07/07/2020 1936   APPEARANCEUR CLEAR 07/07/2020 1936   LABSPEC 1.011 07/07/2020 1936   PHURINE 5.0 07/07/2020 1936   GLUCOSEU NEGATIVE 07/07/2020 1936   HGBUR MODERATE (A) 07/07/2020 1936   BILIRUBINUR NEGATIVE 07/07/2020 1936   KETONESUR NEGATIVE 07/07/2020 1936   PROTEINUR NEGATIVE  07/07/2020 1936   UROBILINOGEN 0.2 08/12/2015 1612   NITRITE NEGATIVE 07/07/2020 1936   LEUKOCYTESUR MODERATE (A) 07/07/2020 1936   Sepsis Labs: @LABRCNTIP (procalcitonin:4,lacticidven:4) ) Recent Results (from the past 240 hour(s))  Urine culture     Status: Abnormal   Collection Time: 07/07/20  7:36 PM   Specimen: Urine, Clean Catch  Result Value Ref Range Status   Specimen Description   Final    URINE, CLEAN CATCH Performed at [facility], [place] [address] [place], [place] [address]    Special Requests  Final    NONE Performed at [facility], [place] [address] [place], [place] [address]    Culture MULTIPLE SPECIES PRESENT, SUGGEST RECOLLECTION (A)  Final   Report Status 07/08/2020 FINAL  Final  SARS Coronavirus 2 by RT PCR (hospital order, performed in [facility] hospital lab) Nasopharyngeal Nasopharyngeal Swab     Status: None   Collection Time: 07/07/20  8:06 PM   Specimen: Nasopharyngeal Swab  Result Value Ref Range Status   SARS Coronavirus 2 NEGATIVE NEGATIVE Final    Comment: (NOTE) SARS-CoV-2 target nucleic acids are NOT DETECTED.  The SARS-CoV-2 RNA is generally detectable in upper and lower respiratory specimens during the acute phase of infection. The lowest concentration of SARS-CoV-2 viral copies this assay can detect is 250 copies / mL. A negative result does not preclude SARS-CoV-2 infection and should not be used as the sole basis for treatment or other patient management decisions.  A negative result may occur with improper specimen collection / handling, submission of specimen other than nasopharyngeal swab, presence of viral mutation(s) within the areas targeted by this assay, and inadequate number of viral copies (<250 copies / mL). A negative result must be combined with clinical observations, patient history, and epidemiological information.  Fact Sheet for Patients:    StrictlyIdeas.no  Fact Sheet for Healthcare Providers: BankingDealers.co.za  This test is not yet approved or  cleared by the [place] FDA and has been authorized for detection and/or diagnosis of SARS-CoV-2 by FDA under an Emergency Use Authorization (EUA).  This EUA will remain in effect (meaning this test can be used) for the duration of the COVID-19 declaration under Section 564(b)(1) of the Act, 21 U.S.C. section 360bbb-3(b)(1), unless the authorization is terminated or revoked sooner.  Performed at [facility], [place] [address], [place], [place] [address]   Surgical pcr screen     Status: None   Collection Time: 07/08/20  9:07 AM   Specimen: Nasal Mucosa; Nasal Swab  Result Value Ref Range Status   MRSA, PCR NEGATIVE NEGATIVE Final   Staphylococcus aureus NEGATIVE NEGATIVE Final    Comment: (NOTE) The Xpert SA Assay (FDA approved for NASAL specimens in patients 31 years of age and older), is one component of a comprehensive surveillance program. It is not intended to diagnose infection nor to guide or monitor treatment. Performed at [facility], [place] [address] [place], [place] [address]          Radiology Studies: No results found.    Scheduled Meds: . aspirin EC  325 mg Oral BID  . Chlorhexidine Gluconate Cloth  6 each Topical Daily  . docusate sodium  100 mg Oral BID  . feeding supplement (ENSURE ENLIVE)  237 mL Oral TID BM  . ferrous sulfate  325 mg Oral BID WC  . ibuprofen  200 mg Oral QID  . ondansetron (ZOFRAN) IV  4 mg Intravenous Once  . potassium chloride  40 mEq Oral BID  . senna  1 tablet Oral BID   Continuous Infusions: . dextrose 5 % and 0.45% NaCl 75 mL/hr at 07/11/20 0320     LOS: 4 days      Debbe Odea, MD [facility] Pager: [email or web address] 07/11/2020, 1:42 PM

## 2020-07-11 NOTE — Progress Notes (Signed)

## 2020-07-11 NOTE — Progress Notes (Signed)
Physical Therapy Treatment Patient Details Name: Anna Curtis MRN: [id number] DOB: 03/02/1931 Today's Date: 07/11/2020    History of Present Illness 86 y.o. female with medical history significant for Alzheimer's dementia, history of squamous cell cancer of the anal canal, hypertension and protein calorie malnutrition who presents with right femoral neck fracture and s/p right hip hemiarthroplasty 07/08/20    PT Comments    Pt more alert this date and speaking intelligibly but rambling and rarely appropriate to task at hand.  Pt able to stand and ambulate limited distance in hall but with significant assist and chair follow for safety  Follow Up Recommendations  Supervision/Assistance - 24 hour;Home health PT     Equipment Recommendations  3in1 (PT)    Recommendations for Other Services       Precautions / Restrictions Precautions Precautions: Posterior Hip;Fall Required Braces or Orthoses: Knee Immobilizer - Right Knee Immobilizer - Right: On at all times Restrictions Weight Bearing Restrictions: No Other Position/Activity Restrictions: WBAT    Mobility  Bed Mobility Overal bed mobility: Needs Assistance Bed Mobility: Supine to Sit     Supine to sit: +2 for physical assistance;Max assist     General bed mobility comments: Pt assisted to EOB sitting with use of bed pad.  Pt following ltd VC but assisted to pull self up to sitting  Transfers Overall transfer level: Needs assistance Equipment used: Rolling walker (2 wheeled) Transfers: Sit to/from Stand Sit to Stand: Mod assist;+2 physical assistance;From elevated surface         General transfer comment: Multimodal cues, increased time and physical assist x 2 to achieve standing.    Ambulation/Gait Ambulation/Gait assistance: Mod assist;+2 safety/equipment Gait Distance (Feet): 38 Feet Assistive device: Rolling walker (2 wheeled) Gait Pattern/deviations: Step-to pattern;Decreased step length - right;Decreased  step length - left;Shuffle;Trunk flexed Gait velocity: decr   General Gait Details: Assist to wt shift to initiate step; constant multimodal cues to attempt to bring pt up inside RW but with limited assist   Stairs             Wheelchair Mobility    Modified Rankin (Stroke Patients Only)       Balance Overall balance assessment: History of Falls                                          Cognition Arousal/Alertness: Awake/alert Behavior During Therapy: Restless;Anxious;Flat affect;Impulsive Overall Cognitive Status: History of cognitive impairments - at baseline                                        Exercises      General Comments        Pertinent Vitals/Pain Pain Assessment: Faces Faces Pain Scale: Hurts a little bit Pain Location: left hip Pain Descriptors / Indicators: Grimacing;Guarding Pain Intervention(s): Limited activity within patient's tolerance;Monitored during session;Premedicated before session    Home Living                      Prior Function            PT Goals (current goals can now be found in the care plan section) Acute Rehab PT Goals Patient Stated Goal: family plans for pt to return home PT Goal Formulation: With patient/family Time For Goal  Achievement: 07/23/20 Potential to Achieve Goals: Fair Progress towards PT goals: Progressing toward goals    Frequency    Min 5X/week      PT Plan Current plan remains appropriate    Co-evaluation              AM-PAC PT "6 Clicks" Mobility   Outcome Measure  Help needed turning from your back to your side while in a flat bed without using bedrails?: A Lot Help needed moving from lying on your back to sitting on the side of a flat bed without using bedrails?: A Lot Help needed moving to and from a bed to a chair (including a wheelchair)?: A Lot Help needed standing up from a chair using your arms (e.g., wheelchair or bedside chair)?:  A Lot Help needed to walk in hospital room?: A Lot Help needed climbing 3-5 steps with a railing? : Total 6 Click Score: 11    End of Session Equipment Utilized During Treatment: Right knee immobilizer;Gait belt Activity Tolerance: Patient tolerated treatment well;Patient limited by fatigue Patient left: in chair;with call bell/phone within reach;with chair alarm set;with nursing/sitter in room Nurse Communication: Mobility status PT Visit Diagnosis: History of falling (Z91.81);Other abnormalities of gait and mobility (R26.89)     Time: 0156-1537 PT Time Calculation (min) (ACUTE ONLY): 22 min  Charges:  $Gait Training: 8-22 mins                     [place] Pager [phone] Office [phone]    [place] 07/11/2020, 1:17 PM

## 2020-07-12 LAB — BASIC METABOLIC PANEL
Anion gap: 9 (ref 5–15)
BUN: 11 mg/dL (ref 8–23)
CO2: 27 mmol/L (ref 22–32)
Calcium: 8.4 mg/dL — ABNORMAL LOW (ref 8.9–10.3)
Chloride: 98 mmol/L (ref 98–111)
Creatinine, Ser: 0.47 mg/dL (ref 0.44–1.00)
GFR calc Af Amer: >60 mL/min (ref >60)
GFR calc non Af Amer: >60 mL/min (ref >60)
Glucose, Bld: 112 mg/dL — ABNORMAL HIGH (ref 70–99)
Potassium: 4.5 mmol/L (ref 3.5–5.1)
Sodium: 134 mmol/L — ABNORMAL LOW (ref 135–145)

## 2020-07-12 NOTE — Plan of Care (Signed)

## 2020-07-12 NOTE — Progress Notes (Signed)
PT Cancellation Note  Patient Details Name: Anna Curtis MRN: [id number] DOB: 1931/04/29   Cancelled Treatment:     PT deferred this am - pt sleeping hard on all three checks and not rousing even when daughters in room.  Will follow.   Autumnrose Yore 07/12/2020, 12:15 PM

## 2020-07-12 NOTE — Progress Notes (Signed)
Physical Therapy Treatment Patient Details Name: Anna Curtis MRN: [id number] DOB: Jun 30, 1931 Today's Date: 07/12/2020    History of Present Illness 86 y.o. 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  Pt with marked improvement in activity tolerance and ambulated significant distance in hall with assist and constant multimodal cues for posture and position from RW.  Pt incontinent of urine and stool and assisted to stand at bedside with CNA assisting with hygiene before returning to bed.   Follow Up Recommendations  Supervision/Assistance - 24 hour;Home health PT     Equipment Recommendations  3in1 (PT)    Recommendations for Other Services       Precautions / Restrictions Precautions Precautions: Posterior Hip;Fall Restrictions Weight Bearing Restrictions: No Other Position/Activity Restrictions: WBAT    Mobility  Bed Mobility Overal bed mobility: Needs Assistance Bed Mobility: Supine to Sit;Sit to Supine     Supine to sit: Mod assist;+2 for physical assistance Sit to supine: Mod assist;+2 for physical assistance   General bed mobility comments: multimodal cues for sequence and physical assist to manage LEs and to control trunk  Transfers Overall transfer level: Needs assistance Equipment used: Rolling walker (2 wheeled) Transfers: Sit to/from Stand Sit to Stand: +2 physical assistance;From elevated surface;Min assist;Mod assist         General transfer comment: Multimodal cues, increased time and physical assist x 2 to achieve standing.    Ambulation/Gait Ambulation/Gait assistance: +2 safety/equipment;Min assist;Mod assist Gait Distance (Feet): 300  Feet Assistive device: Rolling walker (2 wheeled) Gait Pattern/deviations: Step-to pattern;Decreased step length - right;Decreased step length - left;Shuffle;Trunk flexed Gait velocity: decr   General Gait Details: Assist to wt shift to initiate step; constant multimodal cues to attempt to bring pt up inside RW but with limited success   Stairs             Wheelchair Mobility    Modified Rankin (Stroke Patients Only)       Balance Overall balance assessment: History of Falls                                          Cognition Arousal/Alertness: Awake/alert Behavior During Therapy: Restless;Anxious;Flat affect;Impulsive Overall Cognitive Status: History of cognitive impairments - at baseline                                        Exercises      General Comments        Pertinent Vitals/Pain Pain Assessment: Faces Faces Pain Scale: Hurts a little bit Pain Location: left hip Pain Descriptors / Indicators: Grimacing Pain Intervention(s): Limited activity within patient's tolerance;Monitored during session    Home Living                      Prior Function            PT Goals (current goals can now be found in the care plan section) Acute Rehab PT Goals Patient Stated Goal: family plans for pt to return  home PT Goal Formulation: With patient/family Time For Goal Achievement: 07/23/20 Potential to Achieve Goals: Fair Progress towards PT goals: Progressing toward goals    Frequency    Min 5X/week      PT Plan Current plan remains appropriate    Co-evaluation              AM-PAC PT "6 Clicks" Mobility   Outcome Measure  Help needed turning from your back to your side while in a flat bed without using bedrails?: A Lot Help needed moving from lying on your back to sitting on the side of a flat bed without using bedrails?: A Lot Help needed moving to and from a bed to a chair (including a wheelchair)?:  A Lot Help needed standing up from a chair using your arms (e.g., wheelchair or bedside chair)?: A Lot Help needed to walk in hospital room?: A Lot Help needed climbing 3-5 steps with a railing? : A Lot 6 Click Score: 12    End of Session Equipment Utilized During Treatment: Gait belt Activity Tolerance: Patient tolerated treatment well Patient left: in bed;with call bell/phone within reach;with family/visitor present Nurse Communication: Mobility status PT Visit Diagnosis: History of falling (Z91.81);Other abnormalities of gait and mobility (R26.89)     Time: 0488-8916 PT Time Calculation (min) (ACUTE ONLY): 38 min  Charges:  $Gait Training: 23-37 mins $Therapeutic Activity: 8-22 mins                     Debe Coder PT Acute Rehabilitation Services Pager [phone] Office [phone]    Josey Forcier 07/12/2020, 5:08 PM

## 2020-07-12 NOTE — Progress Notes (Signed)
PROGRESS NOTE    [name]   MWN:[id number]  DOB: 1931/05/07  DOA: 07/07/2020 PCP: Josetta Huddle, MD   Brief Narrative:  Anna Curtis is a 86 y.o. 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  Patient lives with her son and last week he noticed that she has been having more unsteady gait and pain to her right side.  Patient is followed by palliative care monthly and they thought that perhaps patient had a UTI initially prescribed her antibiotics last week.  However she continues to have unsteady gait and was able to ambulate freely before without assistance.  They presented to her primary care physician today and had x-ray showing right femoral neck fracture with impaction.  Son is not sure whether patient got up out of bed on her own in the morning and fell.  At baseline, patient is oriented to self and sometimes family member.  She is able to ambulate freely and able to feed herself.  Although needs help with cooking and bathing.   Subjective: Quite sleepy today- could not get up with PT.   Assessment & Plan:   Principal Problem:   Closed right hip fracture (HCC) - s/p right hemiarthroplasty - start Ibuprofen and Tramadol for pain -  Dilaudid being used PTN -per daughters Morphine "isn't working".  - making very little progress with PT- not stable to go home  Active Problems: Hyponatremia - due to poor oral intake-  changed fluids from D51/2 NS to NS - sodium better    Alzheimer's disease ([place]) - with dementia - having behavioral disturbances- no medications help- needs sitter at bedside as she is restless-  -she is followed by palliative care at home   Nutrition - still not eating or drinking- not working with PT either   H/o colon cancer  Time spent in minutes: 25 DVT prophylaxis: per ortho Code  Status: DNR Family Communication: with daughters Disposition Plan:  Status is: Inpatient  Remains inpatient appropriate because:Unsafe d/c plan- not eating or drinking yet. Has not had enough PT.    Dispo: The patient is from: Home              Anticipated d/c is to: Home              Anticipated d/c date is: 1 day              Patient currently is not medically stable to d/c.      Consultants:   ortho Procedures:    right hip hemiarthroplasty Antimicrobials:  Anti-infectives (From admission, onward)   Start     Dose/Rate Route Frequency Ordered Stop   07/08/20 1000  ceFAZolin (ANCEF) IVPB 2g/100 mL premix        2 g 200 mL/hr over 30 Minutes Intravenous On call to O.R. 07/08/20 0953 07/08/20 1058   07/08/20 0923  ceFAZolin (ANCEF) 2-4 GM/100ML-% IVPB       Note to Pharmacy: Mardelle Matte   : cabinet override      07/08/20 0923 07/08/20 1051       Objective: Vitals:   07/11/20 1902 07/11/20 2126 07/12/20 0505 07/12/20 1334  BP: (!) 188/81 (!) 172/65 (!) 175/74 (!) 192/105  Pulse: 89 86 79 88  Resp: 18 18 14 14   Temp: 98.1 F (36.7 C) 98.1 F (36.7 C) 98.2 F (36.8 C) 97.7  F (36.5 C)  TempSrc: Oral Oral Axillary Axillary  SpO2: 100% 96% 98% 100%  Weight:      Height:        Intake/Output Summary (Last 24 hours) at 07/12/2020 1608 Last data filed at 07/12/2020 1410 Gross per 24 hour  Intake 1600.97 ml  Output --  Net 1600.97 ml   Filed Weights   07/09/20 0900  Weight: 47.9 kg    Examination:  Respiratory system: Clear to auscultation. Respiratory effort normal. Cardiovascular system: S1 & S2 heard,  No murmurs  Gastrointestinal system: Abdomen soft, non-tender, nondistended. Normal bowel sounds   Extremities: No cyanosis, clubbing or edema Skin: No rashes or ulcers Psychiatry:  deeply sleeping   Data Reviewed: I have personally reviewed following labs and imaging studies  CBC: Recent Labs  Lab 07/07/20 2006 07/09/20 0240 07/10/20 0836  07/11/20 0310  WBC 7.1 10.0 12.6* 9.7  NEUTROABS 5.4  --   --   --   HGB 11.0* 9.5* 10.1* 10.1*  HCT 33.4* 28.4* 30.5* 29.9*  MCV 95.2 94.0 92.4 92.0  PLT 388 330 370 062   Basic Metabolic Panel: Recent Labs  Lab 07/07/20 2006 07/09/20 0240 07/11/20 0910 07/12/20 0231  NA 138 130* 129* 134*  K 4.5 3.0* 2.3* 4.5  CL 104 97* 91* 98  CO2 24 23 29 27   GLUCOSE 95 156* 145* 112*  BUN 17 14 7* 11  CREATININE 0.80 0.53 0.43* 0.47  CALCIUM 8.5* 8.5* 8.0* 8.4*  MG  --  1.8  --   --    GFR: Estimated Creatinine Clearance: 37.5 mL/min (by C-G formula based on SCr of 0.47 mg/dL). Liver Function Tests: No results for input(s): AST, ALT, ALKPHOS, BILITOT, PROT, ALBUMIN in the last 168 hours. No results for input(s): LIPASE, AMYLASE in the last 168 hours. No results for input(s): AMMONIA in the last 168 hours. Coagulation Profile: Recent Labs  Lab 07/07/20 2006  INR 1.0   Cardiac Enzymes: No results for input(s): CKTOTAL, CKMB, CKMBINDEX, TROPONINI in the last 168 hours. BNP (last 3 results) No results for input(s): PROBNP in the last 8760 hours. HbA1C: No results for input(s): HGBA1C in the last 72 hours. CBG: No results for input(s): GLUCAP in the last 168 hours. Lipid Profile: No results for input(s): CHOL, HDL, LDLCALC, TRIG, CHOLHDL, LDLDIRECT in the last 72 hours. Thyroid Function Tests: No results for input(s): TSH, T4TOTAL, FREET4, T3FREE, THYROIDAB in the last 72 hours. Anemia Panel: No results for input(s): VITAMINB12, FOLATE, FERRITIN, TIBC, IRON, RETICCTPCT in the last 72 hours. Urine analysis:    Component Value Date/Time   COLORURINE YELLOW 07/07/2020 1936   APPEARANCEUR CLEAR 07/07/2020 1936   LABSPEC 1.011 07/07/2020 1936   PHURINE 5.0 07/07/2020 1936   GLUCOSEU NEGATIVE 07/07/2020 1936   HGBUR MODERATE (A) 07/07/2020 1936   BILIRUBINUR NEGATIVE 07/07/2020 1936   KETONESUR NEGATIVE 07/07/2020 1936   PROTEINUR NEGATIVE 07/07/2020 1936   UROBILINOGEN 0.2  08/12/2015 1612   NITRITE NEGATIVE 07/07/2020 1936   LEUKOCYTESUR MODERATE (A) 07/07/2020 1936   Sepsis Labs: @LABRCNTIP (procalcitonin:4,lacticidven:4) ) Recent Results (from the past 240 hour(s))  Urine culture     Status: Abnormal   Collection Time: 07/07/20  7:36 PM   Specimen: Urine, Clean Catch  Result Value Ref Range Status   Specimen Description   Final    URINE, CLEAN CATCH Performed at [facility], [place] [address] [place], [place] [address]    Special Requests   Final  NONE Performed at [facility], [place] [address], [place], [place] [address]    Culture MULTIPLE SPECIES PRESENT, SUGGEST RECOLLECTION (A)  Final   Report Status 07/08/2020 FINAL  Final  SARS Coronavirus 2 by RT PCR (hospital order, performed in [facility] hospital lab) Nasopharyngeal Nasopharyngeal Swab     Status: None   Collection Time: 07/07/20  8:06 PM   Specimen: Nasopharyngeal Swab  Result Value Ref Range Status   SARS Coronavirus 2 NEGATIVE NEGATIVE Final    Comment: (NOTE) SARS-CoV-2 target nucleic acids are NOT DETECTED.  The SARS-CoV-2 RNA is generally detectable in upper and lower respiratory specimens during the acute phase of infection. The lowest concentration of SARS-CoV-2 viral copies this assay can detect is 250 copies / mL. A negative result does not preclude SARS-CoV-2 infection and should not be used as the sole basis for treatment or other patient management decisions.  A negative result may occur with improper specimen collection / handling, submission of specimen other than nasopharyngeal swab, presence of viral mutation(s) within the areas targeted by this assay, and inadequate number of viral copies (<250 copies / mL). A negative result must be combined with clinical observations, patient history, and epidemiological information.  Fact Sheet for Patients:   StrictlyIdeas.no  Fact Sheet for Healthcare  Providers: BankingDealers.co.za  This test is not yet approved or  cleared by the [place] FDA and has been authorized for detection and/or diagnosis of SARS-CoV-2 by FDA under an Emergency Use Authorization (EUA).  This EUA will remain in effect (meaning this test can be used) for the duration of the COVID-19 declaration under Section 564(b)(1) of the Act, 21 U.S.C. section 360bbb-3(b)(1), unless the authorization is terminated or revoked sooner.  Performed at [facility], [place] [address], [place], [place] [address]   Surgical pcr screen     Status: None   Collection Time: 07/08/20  9:07 AM   Specimen: Nasal Mucosa; Nasal Swab  Result Value Ref Range Status   MRSA, PCR NEGATIVE NEGATIVE Final   Staphylococcus aureus NEGATIVE NEGATIVE Final    Comment: (NOTE) The Xpert SA Assay (FDA approved for NASAL specimens in patients 74 years of age and older), is one component of a comprehensive surveillance program. It is not intended to diagnose infection nor to guide or monitor treatment. Performed at [facility], [place] [address], [place], [place] [address]          Radiology Studies: No results found.    Scheduled Meds: . aspirin EC  325 mg Oral BID  . Chlorhexidine Gluconate Cloth  6 each Topical Daily  . docusate sodium  100 mg Oral BID  . feeding supplement (ENSURE ENLIVE)  237 mL Oral TID BM  . ferrous sulfate  325 mg Oral BID WC  . ibuprofen  200 mg Oral QID  . ondansetron (ZOFRAN) IV  4 mg Intravenous Once  . senna  1 tablet Oral BID   Continuous Infusions: . 0.9 % NaCl with KCl 40 mEq / L 75 mL/hr at 07/12/20 0327     LOS: 5 days      Debbe Odea, MD [facility] Pager: [email or web address] 07/12/2020, 4:08 PM

## 2020-07-13 DIAGNOSIS — Z96641 Presence of right artificial hip joint: Secondary | ICD-10-CM

## 2020-07-13 MED ORDER — TIZANIDINE HCL 2 MG PO TABS
2.0000 mg | ORAL_TABLET | Freq: Three times a day (TID) | ORAL | 1 refills | Status: AC | PRN
Start: 2020-07-13 — End: ?

## 2020-07-13 MED ORDER — ASPIRIN 325 MG PO TBEC: 325.0000 mg | DELAYED_RELEASE_TABLET | Freq: Every day | ORAL | 0 refills | Status: DC

## 2020-07-13 MED ORDER — POLYETHYLENE GLYCOL 3350 17 G PO PACK: 17.0000 g | PACK | Freq: Every day | ORAL | 0 refills | Status: DC

## 2020-07-13 MED ORDER — DOCUSATE SODIUM 100 MG PO CAPS: 100.0000 mg | ORAL_CAPSULE | Freq: Two times a day (BID) | ORAL | 0 refills | Status: DC

## 2020-07-13 MED ORDER — BISACODYL 5 MG PO TBEC: 5.0000 mg | DELAYED_RELEASE_TABLET | Freq: Every day | ORAL | 0 refills | Status: DC | PRN

## 2020-07-13 MED ORDER — MAGNESIUM CITRATE PO SOLN: 1.0000 | Freq: Once | ORAL | 3 refills | Status: DC | PRN

## 2020-07-13 MED ORDER — SENNA 8.6 MG PO TABS: 1.0000 | ORAL_TABLET | Freq: Every evening | ORAL | 0 refills | Status: DC | PRN

## 2020-07-13 MED ORDER — ENSURE ENLIVE PO LIQD: 237.0000 mL | Freq: Three times a day (TID) | ORAL | 12 refills | Status: AC

## 2020-07-13 MED ORDER — ACETAMINOPHEN 325 MG PO TABS: 325.0000 mg | ORAL_TABLET | Freq: Four times a day (QID) | ORAL | Status: DC | PRN

## 2020-07-13 MED ORDER — IBUPROFEN 200 MG PO TABS: 200.0000 mg | ORAL_TABLET | Freq: Four times a day (QID) | ORAL | 0 refills | Status: AC | PRN

## 2020-07-13 MED ORDER — HYDROCODONE-ACETAMINOPHEN 5-325 MG PO TABS: 1.0000 | ORAL_TABLET | Freq: Four times a day (QID) | ORAL | 0 refills | Status: AC | PRN

## 2020-07-13 MED ORDER — FERROUS SULFATE 325 (65 FE) MG PO TABS: 325.0000 mg | ORAL_TABLET | Freq: Two times a day (BID) | ORAL | 3 refills | Status: AC

## 2020-07-13 NOTE — Care Management Important Message (Signed)
Important Message  Patient Details IM Letter given to the Patient Name: Anna Curtis MRN: [id number] Date of Birth: [birth year]   Medicare Important Message Given:  Yes     Kerin Salen 07/13/2020, 9:57 AM

## 2020-07-13 NOTE — Progress Notes (Signed)
Subjective: 86 year old female seen in her hospital room for postoperative evaluation s/p right hip hemiarthroplasty.  She appeared well today.  She endorsed mild right hip pain but denied numbness and tingling.       Objective: Vital signs in last 24 hours: Temp:  [97.7 F (36.5 C)-98.3 F (36.8 C)] 98.3 F (36.8 C) (08/09 0514) Pulse Rate:  [72-88] 77 (08/09 0514) Resp:  [14-18] 16 (08/09 0514) BP: (146-192)/(64-105) 146/73 (08/09 0514) SpO2:  [98 %-100 %] 98 % (08/09 0514)  Intake/Output from previous day: 08/08 0701 - 08/09 0700 In: 1095.9 [P.O.:120; I.V.:975.9] Out: -  Intake/Output this shift: No intake/output data recorded.  Recent Labs    07/11/20 0310  HGB 10.1*   Recent Labs    07/11/20 0310  WBC 9.7  RBC 3.25*  HCT 29.9*  PLT 367   Recent Labs    07/11/20 0910 07/12/20 0231  NA 129* 134*  K 2.3* 4.5  CL 91* 98  CO2 29 27  BUN 7* 11  CREATININE 0.43* 0.47  GLUCOSE 145* 112*  CALCIUM 8.0* 8.4*   No results for input(s): LABPT, INR in the last 72 hours.   Examination today revealed a WDWN female who appeared her stated age.  She was oriented to herselft and sometimes to family.  She was in no acute distress. She could ambulate with assistance without difficulty.  Head:  NCAT Eyes: Sclera intact Skin: without obvious lesion.  Aquaseal bandage covered the right posterolateral hip. Respirations: without difficulty and without use of accessory muscles Right hip:  Aquaseal bandage in place at the lateral hip.  No erythema/drainage outside the bandage.  Mild ttp at the lateral hip.  FAROM of the right hip while seated.  LE motor 5/5 and grossly symmetrical. LE sensation symmetrical. Patient neurovascularly intact distal to the surgery site  Assessment/Plan: Assessment:   Follow up evaluation HD6 s/p right hip hemiarthroplasty completed on 07/08/2020.  Plan: 1.  From an orthopaedic perspective, the patient is ready for discharge.  She has good range of  motion of the hip and can ambulate with assistance.  She will require assistance with transfer and with conducting ADLs in the short term.  She is planning to discharge home with her daughter.  I understand home health care has been arranged for her.  2.  The patient will require home physical therapy.  I will provide an order for this today.  3.  Home medications will be ordered for the patient as well.  I have gone over these medicines with the caregiver daughter.   -hydrocodone/acetaminophin, 5/325mg . T1 tab po q6h prn f pain.  #40 RR0. -tizanidine, 2mg .  T1 tab po tid prn f spasm -apirin, 325mg .  T1 tab po bid f28 days. -colace, 100mg . T1 tab po bid prn f constipation.  4.  The patient will follow up with Dr. Berenice Primas / PA Cecilie Lowers on 07/23/2020 @ 9:45am.   Dereck Leep, PA-C 07/13/2020, 12:20 PM

## 2020-07-13 NOTE — Plan of Care (Signed)

## 2020-07-13 NOTE — TOC Transition Note (Signed)
Transition of Care [facility]) - CM/SW Discharge Note   Patient Details  Name: Anna Curtis MRN: [id number] Date of Birth: [birth year]  [facility] CM/SW Contact:  Lia Hopping, [place] Phone Number: 07/13/2020, 3:47 PM   Clinical Narrative:    [place] ([facility]) notified the pt. Will discharge today.  Over the bed table ordered and will be delivered to patient home tomorrow.  Patient daughter notified.    Final next level of care: Home w Home Health Services Barriers to Discharge: Barriers Resolved   Patient Goals and CMS Choice Patient states their goals for this hospitalization and ongoing recovery are:: Per daughter, "Return home." CMS Medicare.gov Compare Post Acute Care list provided to:: Patient Represenative (must comment) (Daughter [name]) Choice offered to / list presented to : [place] / [place]  Discharge Placement                       Discharge Plan and Services In-house Referral: Clinical Social Work   Post Acute Care Choice: Home Health          DME Arranged: Overbed table DME Agency: [employer] Date DME Agency Contacted: 07/13/20 Time DME Agency Contacted: 1212 Representative spoke with at DME Agency: Thedore Mins HH Arranged: OT, Nurse's Aide, PT, RN [facility] Agency: [place] ([place]) Date [place]: 07/09/20 Time [place]: 1443 Representative spoke with at [place]: [place] ([place]) Interventions     Readmission Risk Interventions No flowsheet data found.

## 2020-07-13 NOTE — Discharge Summary (Signed)
Physician Discharge Summary  DANIAL SISLEY BJY:[id number] DOB: January 18, 1931 DOA: 07/07/2020  PCP: Josetta Huddle, MD  Admit date: 07/07/2020 Discharge date: 07/13/2020  Admitted From: home Disposition:  home   Recommendations for Outpatient Follow-up:  1. Palliative care to continue to follow at home  Home Health:  ordered  Discharge Condition:  Stable    CODE STATUS:  stable   Diet recommendation:  Regular diet Consultations:  ortho  Procedures/Studies: . right hip hemiarthroplasty    Discharge Diagnoses:  Principal Problem:   Closed right hip fracture ([place]) Active Problems:   Severe Alzheimer's disease ([place])  Hyponatremia/  Dehydration/ poor oral intake   Severe protein-calorie malnutrition (HCC)   S/P total hip arthroplasty     Brief Summary: Anna Curtis is a 86 y.o.femalewith medical history significant for severe Alzheimer's dementia, history of squamous cell cancer of the anal canal in remission, hypertension and protein calorie malnutrition who is on hospice/ palliative care at home presents with right femoral neck fracture.  Patient lives with her son and last week he noticed that she has been having more unsteady gait and pain to her right side. Patient is followed by palliative care monthly and they thought that perhaps patient had a UTI initially prescribed her antibiotics last week. However she continues to have unsteady gait and was able to ambulate freely before without assistance. They presented to her primary care physician today and had x-ray showing right femoral neck fracture with impaction. Son is not sure whether patient got up out of bed on her own in the morning and fell.  At baseline, patient is oriented to self and sometimes family member. She is able to ambulate freely and able to feed herself. Although needs help with cooking and bathing.  Hospital Course:  Principal Problem:   Closed right hip fracture (HCC) - s/p right  hemiarthroplasty - start Ibuprofen and Tramadol for pain -  Dilaudid being used PTN -per daughters Morphine "isn't working".  - she was very slow to progress with PT but now is able to ambulate with assistance and many cues  Active Problems: Hyponatremia - due to poor oral intake-  - sodium improved with IVF    Alzheimer's disease ([place]) - with dementia - having behavioral disturbances- no medications help- needs sitter at bedside as she is restless-  -she is followed by palliative care at home   Nutrition - very poor nutrition- hopefully will eat more at home    Discharge Exam: Vitals:   07/12/20 1926 07/13/20 0514  BP: (!) 177/72 (!) 146/73  Pulse: 72 77  Resp: 18 16  Temp: 97.7 F (36.5 C) 98.3 F (36.8 C)  SpO2: 99% 98%   Vitals:   07/12/20 1334 07/12/20 1500 07/12/20 1926 07/13/20 0514  BP: (!) 192/105 (!) 169/64 (!) 177/72 (!) 146/73  Pulse: 88 78 72 77  Resp: 14 16 18 16   Temp: 97.7 F (36.5 C)  97.7 F (36.5 C) 98.3 F (36.8 C)  TempSrc: Axillary   Axillary  SpO2: 100% 100% 99% 98%  Weight:      Height:        General: Pt is alert, awake, not in acute distress Cardiovascular: RRR, S1/S2 +, no rubs, no gallops Respiratory: CTA bilaterally, no wheezing, no rhonchi Abdominal: Soft, NT, ND, bowel sounds + Extremities: no edema, no cyanosis   Discharge Instructions  Discharge Instructions    Diet general   Complete by: As directed    Regular diet   Increase  activity slowly   Complete by: As directed    No wound care   Complete by: As directed      Allergies as of 07/13/2020   No Known Allergies     Medication List    STOP taking these medications   acetaminophen 160 MG/5ML liquid Commonly known as: TYLENOL Replaced by: acetaminophen 325 MG tablet   morphine CONCENTRATE 10 MG/0.5ML Soln concentrated solution     TAKE these medications   acetaminophen 325 MG tablet Commonly known as: TYLENOL Take 1-2 tablets (325-650 mg total) by  mouth every 6 (six) hours as needed for mild pain (pain score 1-3 or temp > 100.5). Replaces: acetaminophen 160 MG/5ML liquid   aspirin 325 MG EC tablet Take 1 tablet (325 mg total) by mouth daily.   bisacodyl 5 MG EC tablet Commonly known as: DULCOLAX Take 1 tablet (5 mg total) by mouth daily as needed for moderate constipation.   docusate sodium 100 MG capsule Commonly known as: COLACE Take 1 capsule (100 mg total) by mouth 2 (two) times daily.   feeding supplement (ENSURE ENLIVE) Liqd Take 237 mLs by mouth 3 (three) times daily between meals.   ferrous sulfate 325 (65 FE) MG tablet Take 1 tablet (325 mg total) by mouth 2 (two) times daily with a meal.   ibuprofen 200 MG tablet Commonly known as: ADVIL Take 1 tablet (200 mg total) by mouth every 6 (six) hours as needed.   magnesium citrate Soln Take 296 mLs (1 Bottle total) by mouth once as needed for severe constipation.   polyethylene glycol 17 g packet Commonly known as: MIRALAX / GLYCOLAX Take 17 g by mouth daily.   senna 8.6 MG Tabs tablet Commonly known as: SENOKOT Take 1 tablet (8.6 mg total) by mouth at bedtime as needed for mild constipation.            Durable Medical Equipment  (From admission, onward)         Start     Ordered   07/10/20 [id number]  For home use only DME standard manual wheelchair with seat cushion  Once       Comments: Patient suffers from a hip fracture which impairs their ability to perform daily activities like dressing in the home.  A walker will not resolve issue with performing activities of daily living. A wheelchair will allow patient to safely perform daily activities. Patient can safely propel the wheelchair in the home or has a caregiver who can provide assistance. Length of need Lifetime. Accessories: elevating leg rests (ELRs), wheel locks, extensions and anti-tippers.   07/10/20 0918          No Known Allergies    Pelvis Portable  Result Date: 07/08/2020 CLINICAL  DATA:  Status post RIGHT hip arthroplasty. EXAM: PORTABLE PELVIS 1-2 VIEWS COMPARISON:  July 08, 2019 FINDINGS: Post RIGHT hip arthroplasty, osteopenia. The greater trochanter is incompletely imaged on submitted views. Visualized portions of the RIGHT proximal femur show no acute process. On AP view the femoral component appears well seated. Acetabular component within the acetabulum. Limited view of the pelvis excludes the upper margin of the iliac crest. No signs of pelvic fracture on submitted views which are limited by obliquity and field of view as discussed. IMPRESSION: 1. Post RIGHT hip arthroplasty without complicating features. 2. No signs of pelvic fracture on submitted views which are limited by field of view and obliquity. Given incomplete visualization of the greater trochanter a follow-up evaluation is suggested when the  patient is able to include the RIGHT hip and greater trochanter. Gas in the soft tissues adjacent to the hip compatible with postoperative change. Electronically Signed   By: Zetta Bills M.D.   On: 07/08/2020 13:44   DG HIP UNILAT WITH PELVIS 2-3 VIEWS RIGHT  Result Date: 07/07/2020 CLINICAL DATA:  Right hip pain for 5 days. EXAM: DG HIP (WITH OR WITHOUT PELVIS) 2-3V RIGHT COMPARISON:  None. FINDINGS: There is a right femoral neck fracture with impaction. No subluxation or dislocation. Vascular calcifications noted. IMPRESSION: Right femoral neck fracture with impaction. These results will be called to the ordering clinician or representative by the Radiology Department at the imaging location. Electronically Signed   By: Rolm Baptise M.D.   On: 07/07/2020 16:48      The results of significant diagnostics from this hospitalization (including imaging, microbiology, ancillary and laboratory) are listed below for reference.     Microbiology: Recent Results (from the past 240 hour(s))  Urine culture     Status: Abnormal   Collection Time: 07/07/20  7:36 PM   Specimen:  Urine, Clean Catch  Result Value Ref Range Status   Specimen Description   Final    URINE, CLEAN CATCH Performed at [facility], [place] [address] [place], [place] [address]    Special Requests   Final    NONE Performed at [facility], [place] [address] [place], [place] [address]    Culture MULTIPLE SPECIES PRESENT, SUGGEST RECOLLECTION (A)  Final   Report Status 07/08/2020 FINAL  Final  SARS Coronavirus 2 by RT PCR (hospital order, performed in [facility] hospital lab) Nasopharyngeal Nasopharyngeal Swab     Status: None   Collection Time: 07/07/20  8:06 PM   Specimen: Nasopharyngeal Swab  Result Value Ref Range Status   SARS Coronavirus 2 NEGATIVE NEGATIVE Final    Comment: (NOTE) SARS-CoV-2 target nucleic acids are NOT DETECTED.  The SARS-CoV-2 RNA is generally detectable in upper and lower respiratory specimens during the acute phase of infection. The lowest concentration of SARS-CoV-2 viral copies this assay can detect is 250 copies / mL. A negative result does not preclude SARS-CoV-2 infection and should not be used as the sole basis for treatment or other patient management decisions.  A negative result may occur with improper specimen collection / handling, submission of specimen other than nasopharyngeal swab, presence of viral mutation(s) within the areas targeted by this assay, and inadequate number of viral copies (<250 copies / mL). A negative result must be combined with clinical observations, patient history, and epidemiological information.  Fact Sheet for Patients:   StrictlyIdeas.no  Fact Sheet for Healthcare Providers: BankingDealers.co.za  This test is not yet approved or  cleared by the [place] FDA and has been authorized for detection and/or diagnosis of SARS-CoV-2 by FDA under an Emergency Use Authorization (EUA).  This EUA will remain in effect (meaning this test  can be used) for the duration of the COVID-19 declaration under Section 564(b)(1) of the Act, 21 U.S.C. section 360bbb-3(b)(1), unless the authorization is terminated or revoked sooner.  Performed at [facility], [place] [address] [place], [place] [address]   Surgical pcr screen     Status: None   Collection Time: 07/08/20  9:07 AM   Specimen: Nasal Mucosa; Nasal Swab  Result Value Ref Range Status   MRSA, PCR NEGATIVE NEGATIVE Final   Staphylococcus aureus NEGATIVE NEGATIVE Final    Comment: (NOTE) The Xpert SA Assay (FDA  approved for NASAL specimens in patients 4 years of age and older), is one component of a comprehensive surveillance program. It is not intended to diagnose infection nor to guide or monitor treatment. Performed at [facility], [place] [address] [place], [place] [address]      Labs: BNP (last 3 results) No results for input(s): BNP in the last 8760 hours. Basic Metabolic Panel: Recent Labs  Lab 07/07/20 2006 07/09/20 0240 07/11/20 0910 07/12/20 0231  NA 138 130* 129* 134*  K 4.5 3.0* 2.3* 4.5  CL 104 97* 91* 98  CO2 24 23 29 27   GLUCOSE 95 156* 145* 112*  BUN 17 14 7* 11  CREATININE 0.80 0.53 0.43* 0.47  CALCIUM 8.5* 8.5* 8.0* 8.4*  MG  --  1.8  --   --    Liver Function Tests: No results for input(s): AST, ALT, ALKPHOS, BILITOT, PROT, ALBUMIN in the last 168 hours. No results for input(s): LIPASE, AMYLASE in the last 168 hours. No results for input(s): AMMONIA in the last 168 hours. CBC: Recent Labs  Lab 07/07/20 2006 07/09/20 0240 07/10/20 0836 07/11/20 0310  WBC 7.1 10.0 12.6* 9.7  NEUTROABS 5.4  --   --   --   HGB 11.0* 9.5* 10.1* 10.1*  HCT 33.4* 28.4* 30.5* 29.9*  MCV 95.2 94.0 92.4 92.0  PLT 388 330 370 367   Cardiac Enzymes: No results for input(s): CKTOTAL, CKMB, CKMBINDEX, TROPONINI in the last 168 hours. BNP: Invalid input(s): POCBNP CBG: No results for input(s): GLUCAP in the last  168 hours. D-Dimer No results for input(s): DDIMER in the last 72 hours. Hgb A1c No results for input(s): HGBA1C in the last 72 hours. Lipid Profile No results for input(s): CHOL, HDL, LDLCALC, TRIG, CHOLHDL, LDLDIRECT in the last 72 hours. Thyroid function studies No results for input(s): TSH, T4TOTAL, T3FREE, THYROIDAB in the last 72 hours.  Invalid input(s): FREET3 Anemia work up No results for input(s): VITAMINB12, FOLATE, FERRITIN, TIBC, IRON, RETICCTPCT in the last 72 hours. Urinalysis    Component Value Date/Time   COLORURINE YELLOW 07/07/2020 1936   APPEARANCEUR CLEAR 07/07/2020 1936   LABSPEC 1.011 07/07/2020 1936   PHURINE 5.0 07/07/2020 1936   GLUCOSEU NEGATIVE 07/07/2020 1936   HGBUR MODERATE (A) 07/07/2020 1936   BILIRUBINUR NEGATIVE 07/07/2020 1936   KETONESUR NEGATIVE 07/07/2020 1936   PROTEINUR NEGATIVE 07/07/2020 1936   UROBILINOGEN 0.2 08/12/2015 1612   NITRITE NEGATIVE 07/07/2020 1936   LEUKOCYTESUR MODERATE (A) 07/07/2020 1936   Sepsis Labs Invalid input(s): PROCALCITONIN,  WBC,  LACTICIDVEN Microbiology Recent Results (from the past 240 hour(s))  Urine culture     Status: Abnormal   Collection Time: 07/07/20  7:36 PM   Specimen: Urine, Clean Catch  Result Value Ref Range Status   Specimen Description   Final    URINE, CLEAN CATCH Performed at [facility], [place] [address] [place], [place] [address]    Special Requests   Final    NONE Performed at [facility], [place] [address] [place], [place] [address]    Culture MULTIPLE SPECIES PRESENT, SUGGEST RECOLLECTION (A)  Final   Report Status 07/08/2020 FINAL  Final  SARS Coronavirus 2 by RT PCR (hospital order, performed in [facility] hospital lab) Nasopharyngeal Nasopharyngeal Swab     Status: None   Collection Time: 07/07/20  8:06 PM   Specimen: Nasopharyngeal Swab  Result Value Ref Range Status   SARS Coronavirus 2 NEGATIVE NEGATIVE Final  Comment:  (NOTE) SARS-CoV-2 target nucleic acids are NOT DETECTED.  The SARS-CoV-2 RNA is generally detectable in upper and lower respiratory specimens during the acute phase of infection. The lowest concentration of SARS-CoV-2 viral copies this assay can detect is 250 copies / mL. A negative result does not preclude SARS-CoV-2 infection and should not be used as the sole basis for treatment or other patient management decisions.  A negative result may occur with improper specimen collection / handling, submission of specimen other than nasopharyngeal swab, presence of viral mutation(s) within the areas targeted by this assay, and inadequate number of viral copies (<250 copies / mL). A negative result must be combined with clinical observations, patient history, and epidemiological information.  Fact Sheet for Patients:   StrictlyIdeas.no  Fact Sheet for Healthcare Providers: BankingDealers.co.za  This test is not yet approved or  cleared by the [place] FDA and has been authorized for detection and/or diagnosis of SARS-CoV-2 by FDA under an Emergency Use Authorization (EUA).  This EUA will remain in effect (meaning this test can be used) for the duration of the COVID-19 declaration under Section 564(b)(1) of the Act, 21 U.S.C. section 360bbb-3(b)(1), unless the authorization is terminated or revoked sooner.  Performed at [facility], [place] [address], [place], [place] [address]   Surgical pcr screen     Status: None   Collection Time: 07/08/20  9:07 AM   Specimen: Nasal Mucosa; Nasal Swab  Result Value Ref Range Status   MRSA, PCR NEGATIVE NEGATIVE Final   Staphylococcus aureus NEGATIVE NEGATIVE Final    Comment: (NOTE) The Xpert SA Assay (FDA approved for NASAL specimens in patients 61 years of age and older), is one component of a comprehensive surveillance program. It is not intended to diagnose infection nor  to guide or monitor treatment. Performed at [facility], [place] [address], [place],  [address]      Time coordinating discharge in minutes: 65  SIGNED:   Debbe Odea, MD  [facility] 07/13/2020, 9:01 AM

## 2020-07-13 NOTE — Progress Notes (Signed)
Physical Therapy Treatment Patient Details Name: Anna Curtis MRN: [id number] DOB: 05-May-1931 Today's Date: 07/13/2020    History of Present Illness 86 y.o. female with medical history significant for Alzheimer's dementia, history of squamous cell cancer of the anal canal, hypertension and protein calorie malnutrition who presents with right femoral neck fracture and s/p right hip hemiarthroplasty 07/08/20    PT Comments    POD # 5 Assisted OOB to [facility] then to amb in hallway.  General Gait Details: instructed daughter "hands on" safe handling during transfers, amb and esp with turns.  Instructed to have pt wear her gait belt and safe guarding tech esp with turns.  "when she turns, you turn".  Pt does have a tendancy to push walker too far to front.  Unable to correct with VC's, pt required physical assist for proper walker to self distance. Pt plans to D/C to home with 24/7 family care as pt has Advanced Dementia.   Follow Up Recommendations  Supervision/Assistance - 24 hour;Home health PT     Equipment Recommendations  3in1 (PT)    Recommendations for Other Services       Precautions / Restrictions Precautions Precautions: Posterior Hip;Fall Precaution Comments: Hx dementia unable to recall any THP Restrictions Weight Bearing Restrictions: No Other Position/Activity Restrictions: WBAT    Mobility  Bed Mobility Overal bed mobility: Needs Assistance Bed Mobility: Supine to Sit     Supine to sit: Mod assist;Max assist     General bed mobility comments: multimodal cues for sequence and physical assist to manage LEs and to control trunk and adhere to THP  Transfers Overall transfer level: Needs assistance Equipment used: Rolling walker (2 wheeled) Transfers: Sit to/from [facility] Sit to Stand: Min assist;Mod assist Stand pivot transfers: Mod assist       General transfer comment: hands on assist and 50% VC's for proper tech and safety with turns.   Assisted OOB to [facility] then from [facility] to amb.  Tactile VC's to avoid hip flex > 90 decrease esp with sit to stand.  Ambulation/Gait Ambulation/Gait assistance: Min assist Gait Distance (Feet): 185 Feet Assistive device: Rolling walker (2 wheeled) Gait Pattern/deviations: Step-to pattern;Decreased step length - right;Decreased step length - left;Shuffle;Trunk flexed Gait velocity: decreased   General Gait Details: instructed daughter "hands on" safe handling during transfers, amb and esp with turns.  Instructed to have pt wear her gait belt and safe guarding tech esp with turns.  "when she turns, you turn".  Pt does have a tendancy to push walker too far to front.  Unable to correct with VC's, pt required physical assist for proper walker to self distance.   Stairs             Wheelchair Mobility    Modified Rankin (Stroke Patients Only)       Balance                                            Cognition   Behavior During Therapy: Restless;Anxious;Flat affect;Impulsive Overall Cognitive Status: History of cognitive impairments - at baseline                                 General Comments: daughter present for safe handling during mobility as pt is present with advanced dementia following repeat functionals VC's  Exercises      General Comments        Pertinent Vitals/Pain Pain Assessment: Faces Faces Pain Scale: Hurts a little bit Pain Location: left hip Pain Descriptors / Indicators: Grimacing Pain Intervention(s): Monitored during session    Home Living                      Prior Function            PT Goals (current goals can now be found in the care plan section) Progress towards PT goals: Progressing toward goals    Frequency    Min 5X/week      PT Plan Current plan remains appropriate    Co-evaluation              AM-PAC PT "6 Clicks" Mobility   Outcome Measure  Help needed turning from  your back to your side while in a flat bed without using bedrails?: A Lot Help needed moving from lying on your back to sitting on the side of a flat bed without using bedrails?: A Lot Help needed moving to and from a bed to a chair (including a wheelchair)?: A Lot Help needed standing up from a chair using your arms (e.g., wheelchair or bedside chair)?: A Lot Help needed to walk in hospital room?: A Lot Help needed climbing 3-5 steps with a railing? : A Lot 6 Click Score: 12    End of Session Equipment Utilized During Treatment: Gait belt Activity Tolerance: Patient tolerated treatment well Patient left: in chair;with call bell/phone within reach;with chair alarm set;with family/visitor present Nurse Communication: Mobility status (pt ready for D/C to home with family 24/7 care) PT Visit Diagnosis: History of falling (Z91.81);Other abnormalities of gait and mobility (R26.89)     Time: 3329-5188 PT Time Calculation (min) (ACUTE ONLY): 34 min  Charges:  $Gait Training: 8-22 mins $Therapeutic Activity: 8-22 mins                     Rica Koyanagi  PTA Acute  Rehabilitation Services Pager      [phone] Office      [phone]

## 2020-07-13 NOTE — Discharge Instructions (Signed)
Partial Hip Replacement, Posterior, Care After This sheet gives you information about how to care for yourself after your procedure. Your health care provider may also give you more specific instructions. If you have problems or questions, contact your health care provider. What can I expect after the procedure? After the procedure, it is common to have:  Pain and swelling.  A small amount of blood or clear fluid coming from your incision. Follow these instructions at home: Medicines  Take over-the-counter and prescription medicines only as told by your health care provider.  If you were prescribed a blood thinner (anticoagulant), take it as told by your health care provider. Activity  Ask your health care provider what activities are safe for you. A physical therapist may help you do exercises as directed.  Rest often, but move around as much as you can tolerate. This helps prevent stiffness, skin sores, and blood clots.  Do not use your hip or leg to support (bear) your body weight until your health care provider says that you can. Follow weight-bearing restrictions. Use a walker or crutches as directed.  Use items to help you with your activities, such as: ? A long-handled shoehorn to help you put your shoes on. ? Elastic shoelaces that do not need to be retied. ? A reacher or grabber to pick items up off the floor. Movement restrictions   To help prevent hip dislocation, follow restrictions as told by your health care provider. For example: ? Do not cross your legs. To remind yourself about this, you may keep a pillow between your legs while lying in bed. ? Do not bend at the hip and waist or bend farther than 90 degrees of hip flexion. To avoid bending this far:  Do not bring your knees higher than your hips.  Do not pick up something from the floor while sitting in a chair.  Avoid sitting in low chairs.  Use a raised toilet seat.  When standing up from a seated  position, keep the injured leg out in front of you.  Do not bend down to a squat position while in the shower. You may need someone to help you adjust the shower controls or wash the parts of your body that are out of your reach. ? Avoid twisting at your waist and reaching across your body to the side of the affected leg. ? Avoid rotating your toes inward.  When using stairs: ? When you are going up, step with your non-injured leg first. ? When you are going down, step with your injured leg first.  When getting into a car: 1. Raise the seat as high as possible, move the seat as far back as it will go, and recline the upper part of the seat slightly. 2. Sit down into the seat with your injured leg extended out of the car. 3. Scoot back in the seat as you move the lower half of your body into the car. Try to avoid bumping your foot or leg as you bring it into the car. To make this motion smooth and easy on a cloth seat, try placing a plastic bag on the seat. Incision care   Follow instructions from your health care provider about how to take care of your incision. Make sure you: ? Wash your hands with soap and water before you change your bandage (dressing). If soap and water are not available, use hand sanitizer. ? Change your dressing as told by your health care provider. ?  Leave stitches (sutures), skin glue, or adhesive strips in place. These skin closures may need to stay in place for 2 weeks or longer. If adhesive strip edges start to loosen and curl up, you may trim the loose edges. Do not remove adhesive strips completely unless your health care provider tells you to do that.  Do not take baths, swim, or use a hot tub until your health care provider approves.  Check your incision area every day for signs of infection. Check for: ? More redness, swelling, or pain. ? More fluid or blood. ? Warmth. ? Pus or a bad smell. Managing pain, stiffness, and swelling   If directed, put ice  on the affected area. ? Put ice in a plastic bag. ? Place a towel between your skin and the bag. ? Leave the ice on for 20 minutes, 2-3 times a day.  Move your toes often to avoid stiffness and to lessen swelling.  Raise (elevate) your leg above the level of your heart while you are sitting or lying down. Try putting a few pillows under your leg. This will take some of the pressure off your hip. Safety  To help prevent falls, keep floors clear of objects that you may trip over, and keep items that you need within easy reach.  Wear an apron or tool belt with pockets for carrying objects. This will keep your hands free and help you with your balance. Driving  Ask your health care provider when it is safe to drive.  Do not drive or use heavy machinery while taking prescription pain medicine. General instructions  Continue with breathing exercises.  Do not use any products that contain nicotine or tobacco, such as cigarettes and e-cigarettes. These can delay bone healing. If you need help quitting, ask your health care provider.  Wear compression stockings as told by your health care provider.  If you are taking prescription pain medicine, take actions to prevent or treat constipation. Your health care provider may recommend that you: ? Drink enough fluid to keep your urine pale yellow. ? Eat foods that are high in fiber, such as fresh fruits and vegetables, whole grains, and beans. ? Limit foods that are high in fat and processed sugars, such as fried or sweet foods. ? Take an over-the-counter or prescription medicine for constipation.  Tell your health care provider if you plan to have dental work. Also: ? Tell your dentist about your joint replacement. ? Ask your health care provider if there are any special instructions you need to follow before having dental care and routine cleanings.  Keep all follow-up visits as told by your health care provider. This is important. Contact a  health care provider if:  You have severe or continued pain in your hip.  You have either of these at your incision site: ? Any symptoms of infection. ? Persistent bleeding.  You have a fever or chills.  Your incision breaks open. Get help right away if you have:  Pain or swelling in your calf or leg.  Difficulty breathing.  An irregular heartbeat.  Chest pain. Summary  Do not use your leg to support your body weight until your health care provider approves.  To help prevent hip dislocation, follow instructions from your health care provider about movement restrictions.  Make sure you know what symptoms should cause you to get help right away. This information is not intended to replace advice given to you by your health care provider. Make sure you  discuss any questions you have with your health care provider. Document Revised: 04/01/2019 Document Reviewed: 10/30/2017 Elsevier Patient Education  2020 [employer].

## 2020-07-13 NOTE — Progress Notes (Signed)
Current Palliative:  [facility]uring [facility] ([facility]) Community Based Palliative Care        This patient is enrolled in our palliative care services in the community.  [facility] will continue to follow for any discharge planning needs and to coordinate continuation of palliative care.    If you have questions or need assistance, please call [phone] or contact the hospital Liaison listed on AMION.        Domenic Moras, BSN, RN [facility] Liaison   [phone] (24h on call)

## 2020-07-14 DIAGNOSIS — E43 Unspecified severe protein-calorie malnutrition: Secondary | ICD-10-CM | POA: Diagnosis not present

## 2020-07-14 DIAGNOSIS — Z87891 Personal history of nicotine dependence: Secondary | ICD-10-CM | POA: Diagnosis not present

## 2020-07-14 DIAGNOSIS — Z90710 Acquired absence of both cervix and uterus: Secondary | ICD-10-CM | POA: Diagnosis not present

## 2020-07-14 DIAGNOSIS — Z8673 Personal history of transient ischemic attack (TIA), and cerebral infarction without residual deficits: Secondary | ICD-10-CM | POA: Diagnosis not present

## 2020-07-14 DIAGNOSIS — I10 Essential (primary) hypertension: Secondary | ICD-10-CM | POA: Diagnosis not present

## 2020-07-14 DIAGNOSIS — Z85048 Personal history of other malignant neoplasm of rectum, rectosigmoid junction, and anus: Secondary | ICD-10-CM | POA: Diagnosis not present

## 2020-07-14 DIAGNOSIS — Z7982 Long term (current) use of aspirin: Secondary | ICD-10-CM | POA: Diagnosis not present

## 2020-07-14 DIAGNOSIS — E871 Hypo-osmolality and hyponatremia: Secondary | ICD-10-CM | POA: Diagnosis not present

## 2020-07-14 DIAGNOSIS — Z853 Personal history of malignant neoplasm of breast: Secondary | ICD-10-CM | POA: Diagnosis not present

## 2020-07-14 DIAGNOSIS — E860 Dehydration: Secondary | ICD-10-CM | POA: Diagnosis not present

## 2020-07-14 DIAGNOSIS — Z96641 Presence of right artificial hip joint: Secondary | ICD-10-CM | POA: Diagnosis not present

## 2020-07-14 DIAGNOSIS — S72001D Fracture of unspecified part of neck of right femur, subsequent encounter for closed fracture with routine healing: Secondary | ICD-10-CM | POA: Diagnosis not present

## 2020-07-14 DIAGNOSIS — S72001A Fracture of unspecified part of neck of right femur, initial encounter for closed fracture: Secondary | ICD-10-CM | POA: Diagnosis not present

## 2020-07-14 DIAGNOSIS — Z993 Dependence on wheelchair: Secondary | ICD-10-CM | POA: Diagnosis not present

## 2020-07-14 DIAGNOSIS — W19XXXD Unspecified fall, subsequent encounter: Secondary | ICD-10-CM | POA: Diagnosis not present

## 2020-07-14 DIAGNOSIS — F0280 Dementia in other diseases classified elsewhere without behavioral disturbance: Secondary | ICD-10-CM | POA: Diagnosis not present

## 2020-07-14 DIAGNOSIS — G309 Alzheimer's disease, unspecified: Secondary | ICD-10-CM | POA: Diagnosis not present

## 2020-07-14 DIAGNOSIS — R638 Other symptoms and signs concerning food and fluid intake: Secondary | ICD-10-CM | POA: Diagnosis not present

## 2020-07-14 DIAGNOSIS — N8110 Cystocele, unspecified: Secondary | ICD-10-CM | POA: Diagnosis not present

## 2020-07-14 DIAGNOSIS — Z9181 History of falling: Secondary | ICD-10-CM | POA: Diagnosis not present

## 2020-07-14 DIAGNOSIS — Z79891 Long term (current) use of opiate analgesic: Secondary | ICD-10-CM | POA: Diagnosis not present

## 2020-07-15 DIAGNOSIS — G309 Alzheimer's disease, unspecified: Secondary | ICD-10-CM | POA: Diagnosis not present

## 2020-07-15 DIAGNOSIS — F0280 Dementia in other diseases classified elsewhere without behavioral disturbance: Secondary | ICD-10-CM | POA: Diagnosis not present

## 2020-07-15 DIAGNOSIS — E871 Hypo-osmolality and hyponatremia: Secondary | ICD-10-CM | POA: Diagnosis not present

## 2020-07-15 DIAGNOSIS — I10 Essential (primary) hypertension: Secondary | ICD-10-CM | POA: Diagnosis not present

## 2020-07-15 DIAGNOSIS — E43 Unspecified severe protein-calorie malnutrition: Secondary | ICD-10-CM | POA: Diagnosis not present

## 2020-07-15 DIAGNOSIS — S72001D Fracture of unspecified part of neck of right femur, subsequent encounter for closed fracture with routine healing: Secondary | ICD-10-CM | POA: Diagnosis not present

## 2020-07-16 DIAGNOSIS — F0280 Dementia in other diseases classified elsewhere without behavioral disturbance: Secondary | ICD-10-CM | POA: Diagnosis not present

## 2020-07-16 DIAGNOSIS — E43 Unspecified severe protein-calorie malnutrition: Secondary | ICD-10-CM | POA: Diagnosis not present

## 2020-07-16 DIAGNOSIS — S72001D Fracture of unspecified part of neck of right femur, subsequent encounter for closed fracture with routine healing: Secondary | ICD-10-CM | POA: Diagnosis not present

## 2020-07-16 DIAGNOSIS — E871 Hypo-osmolality and hyponatremia: Secondary | ICD-10-CM | POA: Diagnosis not present

## 2020-07-16 DIAGNOSIS — I10 Essential (primary) hypertension: Secondary | ICD-10-CM | POA: Diagnosis not present

## 2020-07-16 DIAGNOSIS — G309 Alzheimer's disease, unspecified: Secondary | ICD-10-CM | POA: Diagnosis not present

## 2020-07-17 ENCOUNTER — Other Ambulatory Visit: Payer: Medicare Other | Admitting: *Deleted

## 2020-07-17 ENCOUNTER — Other Ambulatory Visit: Payer: Self-pay

## 2020-07-17 ENCOUNTER — Encounter (HOSPITAL_COMMUNITY): Payer: Self-pay | Admitting: Orthopedic Surgery

## 2020-07-17 DIAGNOSIS — G309 Alzheimer's disease, unspecified: Secondary | ICD-10-CM | POA: Diagnosis not present

## 2020-07-17 DIAGNOSIS — F0280 Dementia in other diseases classified elsewhere without behavioral disturbance: Secondary | ICD-10-CM | POA: Diagnosis not present

## 2020-07-17 DIAGNOSIS — I10 Essential (primary) hypertension: Secondary | ICD-10-CM | POA: Diagnosis not present

## 2020-07-17 DIAGNOSIS — E871 Hypo-osmolality and hyponatremia: Secondary | ICD-10-CM | POA: Diagnosis not present

## 2020-07-17 DIAGNOSIS — S72001D Fracture of unspecified part of neck of right femur, subsequent encounter for closed fracture with routine healing: Secondary | ICD-10-CM | POA: Diagnosis not present

## 2020-07-17 DIAGNOSIS — Z515 Encounter for palliative care: Secondary | ICD-10-CM

## 2020-07-17 DIAGNOSIS — E43 Unspecified severe protein-calorie malnutrition: Secondary | ICD-10-CM | POA: Diagnosis not present

## 2020-07-20 ENCOUNTER — Encounter (HOSPITAL_COMMUNITY): Payer: Self-pay | Admitting: Orthopedic Surgery

## 2020-07-21 DIAGNOSIS — I10 Essential (primary) hypertension: Secondary | ICD-10-CM | POA: Diagnosis not present

## 2020-07-21 DIAGNOSIS — S72001D Fracture of unspecified part of neck of right femur, subsequent encounter for closed fracture with routine healing: Secondary | ICD-10-CM | POA: Diagnosis not present

## 2020-07-21 DIAGNOSIS — G309 Alzheimer's disease, unspecified: Secondary | ICD-10-CM | POA: Diagnosis not present

## 2020-07-21 DIAGNOSIS — E43 Unspecified severe protein-calorie malnutrition: Secondary | ICD-10-CM | POA: Diagnosis not present

## 2020-07-21 DIAGNOSIS — F0280 Dementia in other diseases classified elsewhere without behavioral disturbance: Secondary | ICD-10-CM | POA: Diagnosis not present

## 2020-07-21 DIAGNOSIS — E871 Hypo-osmolality and hyponatremia: Secondary | ICD-10-CM | POA: Diagnosis not present

## 2020-07-23 DIAGNOSIS — F0280 Dementia in other diseases classified elsewhere without behavioral disturbance: Secondary | ICD-10-CM | POA: Diagnosis not present

## 2020-07-23 DIAGNOSIS — E871 Hypo-osmolality and hyponatremia: Secondary | ICD-10-CM | POA: Diagnosis not present

## 2020-07-23 DIAGNOSIS — G309 Alzheimer's disease, unspecified: Secondary | ICD-10-CM | POA: Diagnosis not present

## 2020-07-23 DIAGNOSIS — S72001D Fracture of unspecified part of neck of right femur, subsequent encounter for closed fracture with routine healing: Secondary | ICD-10-CM | POA: Diagnosis not present

## 2020-07-23 DIAGNOSIS — E43 Unspecified severe protein-calorie malnutrition: Secondary | ICD-10-CM | POA: Diagnosis not present

## 2020-07-23 DIAGNOSIS — I10 Essential (primary) hypertension: Secondary | ICD-10-CM | POA: Diagnosis not present

## 2020-07-24 DIAGNOSIS — G309 Alzheimer's disease, unspecified: Secondary | ICD-10-CM | POA: Diagnosis not present

## 2020-07-24 DIAGNOSIS — F0280 Dementia in other diseases classified elsewhere without behavioral disturbance: Secondary | ICD-10-CM | POA: Diagnosis not present

## 2020-07-24 DIAGNOSIS — E871 Hypo-osmolality and hyponatremia: Secondary | ICD-10-CM | POA: Diagnosis not present

## 2020-07-24 DIAGNOSIS — S72001D Fracture of unspecified part of neck of right femur, subsequent encounter for closed fracture with routine healing: Secondary | ICD-10-CM | POA: Diagnosis not present

## 2020-07-24 DIAGNOSIS — E43 Unspecified severe protein-calorie malnutrition: Secondary | ICD-10-CM | POA: Diagnosis not present

## 2020-07-24 DIAGNOSIS — I10 Essential (primary) hypertension: Secondary | ICD-10-CM | POA: Diagnosis not present

## 2020-07-25 DIAGNOSIS — G309 Alzheimer's disease, unspecified: Secondary | ICD-10-CM | POA: Diagnosis not present

## 2020-07-25 DIAGNOSIS — E43 Unspecified severe protein-calorie malnutrition: Secondary | ICD-10-CM | POA: Diagnosis not present

## 2020-07-25 DIAGNOSIS — I10 Essential (primary) hypertension: Secondary | ICD-10-CM | POA: Diagnosis not present

## 2020-07-25 DIAGNOSIS — F0280 Dementia in other diseases classified elsewhere without behavioral disturbance: Secondary | ICD-10-CM | POA: Diagnosis not present

## 2020-07-25 DIAGNOSIS — S72001D Fracture of unspecified part of neck of right femur, subsequent encounter for closed fracture with routine healing: Secondary | ICD-10-CM | POA: Diagnosis not present

## 2020-07-25 DIAGNOSIS — E871 Hypo-osmolality and hyponatremia: Secondary | ICD-10-CM | POA: Diagnosis not present

## 2020-07-27 DIAGNOSIS — E43 Unspecified severe protein-calorie malnutrition: Secondary | ICD-10-CM | POA: Diagnosis not present

## 2020-07-27 DIAGNOSIS — S72001D Fracture of unspecified part of neck of right femur, subsequent encounter for closed fracture with routine healing: Secondary | ICD-10-CM | POA: Diagnosis not present

## 2020-07-27 DIAGNOSIS — I10 Essential (primary) hypertension: Secondary | ICD-10-CM | POA: Diagnosis not present

## 2020-07-27 DIAGNOSIS — E871 Hypo-osmolality and hyponatremia: Secondary | ICD-10-CM | POA: Diagnosis not present

## 2020-07-27 DIAGNOSIS — G309 Alzheimer's disease, unspecified: Secondary | ICD-10-CM | POA: Diagnosis not present

## 2020-07-27 DIAGNOSIS — F0280 Dementia in other diseases classified elsewhere without behavioral disturbance: Secondary | ICD-10-CM | POA: Diagnosis not present

## 2020-07-28 DIAGNOSIS — E871 Hypo-osmolality and hyponatremia: Secondary | ICD-10-CM | POA: Diagnosis not present

## 2020-07-28 DIAGNOSIS — I10 Essential (primary) hypertension: Secondary | ICD-10-CM | POA: Diagnosis not present

## 2020-07-28 DIAGNOSIS — E43 Unspecified severe protein-calorie malnutrition: Secondary | ICD-10-CM | POA: Diagnosis not present

## 2020-07-28 DIAGNOSIS — S72001D Fracture of unspecified part of neck of right femur, subsequent encounter for closed fracture with routine healing: Secondary | ICD-10-CM | POA: Diagnosis not present

## 2020-07-28 DIAGNOSIS — F0280 Dementia in other diseases classified elsewhere without behavioral disturbance: Secondary | ICD-10-CM | POA: Diagnosis not present

## 2020-07-28 DIAGNOSIS — G309 Alzheimer's disease, unspecified: Secondary | ICD-10-CM | POA: Diagnosis not present

## 2020-07-29 DIAGNOSIS — E43 Unspecified severe protein-calorie malnutrition: Secondary | ICD-10-CM | POA: Diagnosis not present

## 2020-07-29 DIAGNOSIS — G309 Alzheimer's disease, unspecified: Secondary | ICD-10-CM | POA: Diagnosis not present

## 2020-07-29 DIAGNOSIS — S72001D Fracture of unspecified part of neck of right femur, subsequent encounter for closed fracture with routine healing: Secondary | ICD-10-CM | POA: Diagnosis not present

## 2020-07-29 DIAGNOSIS — I10 Essential (primary) hypertension: Secondary | ICD-10-CM | POA: Diagnosis not present

## 2020-07-29 DIAGNOSIS — E871 Hypo-osmolality and hyponatremia: Secondary | ICD-10-CM | POA: Diagnosis not present

## 2020-07-29 DIAGNOSIS — F0280 Dementia in other diseases classified elsewhere without behavioral disturbance: Secondary | ICD-10-CM | POA: Diagnosis not present

## 2020-07-30 DIAGNOSIS — E43 Unspecified severe protein-calorie malnutrition: Secondary | ICD-10-CM | POA: Diagnosis not present

## 2020-07-30 DIAGNOSIS — F0280 Dementia in other diseases classified elsewhere without behavioral disturbance: Secondary | ICD-10-CM | POA: Diagnosis not present

## 2020-07-30 DIAGNOSIS — S72001D Fracture of unspecified part of neck of right femur, subsequent encounter for closed fracture with routine healing: Secondary | ICD-10-CM | POA: Diagnosis not present

## 2020-07-30 DIAGNOSIS — I10 Essential (primary) hypertension: Secondary | ICD-10-CM | POA: Diagnosis not present

## 2020-07-30 DIAGNOSIS — E871 Hypo-osmolality and hyponatremia: Secondary | ICD-10-CM | POA: Diagnosis not present

## 2020-07-30 DIAGNOSIS — G309 Alzheimer's disease, unspecified: Secondary | ICD-10-CM | POA: Diagnosis not present

## 2020-07-31 DIAGNOSIS — I10 Essential (primary) hypertension: Secondary | ICD-10-CM | POA: Diagnosis not present

## 2020-07-31 DIAGNOSIS — F0280 Dementia in other diseases classified elsewhere without behavioral disturbance: Secondary | ICD-10-CM | POA: Diagnosis not present

## 2020-07-31 DIAGNOSIS — E43 Unspecified severe protein-calorie malnutrition: Secondary | ICD-10-CM | POA: Diagnosis not present

## 2020-07-31 DIAGNOSIS — G309 Alzheimer's disease, unspecified: Secondary | ICD-10-CM | POA: Diagnosis not present

## 2020-07-31 DIAGNOSIS — E871 Hypo-osmolality and hyponatremia: Secondary | ICD-10-CM | POA: Diagnosis not present

## 2020-07-31 DIAGNOSIS — S72001D Fracture of unspecified part of neck of right femur, subsequent encounter for closed fracture with routine healing: Secondary | ICD-10-CM | POA: Diagnosis not present

## 2020-08-03 DIAGNOSIS — F0280 Dementia in other diseases classified elsewhere without behavioral disturbance: Secondary | ICD-10-CM | POA: Diagnosis not present

## 2020-08-03 DIAGNOSIS — E871 Hypo-osmolality and hyponatremia: Secondary | ICD-10-CM | POA: Diagnosis not present

## 2020-08-03 DIAGNOSIS — I10 Essential (primary) hypertension: Secondary | ICD-10-CM | POA: Diagnosis not present

## 2020-08-03 DIAGNOSIS — G309 Alzheimer's disease, unspecified: Secondary | ICD-10-CM | POA: Diagnosis not present

## 2020-08-03 DIAGNOSIS — S72001D Fracture of unspecified part of neck of right femur, subsequent encounter for closed fracture with routine healing: Secondary | ICD-10-CM | POA: Diagnosis not present

## 2020-08-03 DIAGNOSIS — E43 Unspecified severe protein-calorie malnutrition: Secondary | ICD-10-CM | POA: Diagnosis not present

## 2020-08-04 DIAGNOSIS — G309 Alzheimer's disease, unspecified: Secondary | ICD-10-CM | POA: Diagnosis not present

## 2020-08-04 DIAGNOSIS — F0280 Dementia in other diseases classified elsewhere without behavioral disturbance: Secondary | ICD-10-CM | POA: Diagnosis not present

## 2020-08-04 DIAGNOSIS — E43 Unspecified severe protein-calorie malnutrition: Secondary | ICD-10-CM | POA: Diagnosis not present

## 2020-08-04 DIAGNOSIS — I10 Essential (primary) hypertension: Secondary | ICD-10-CM | POA: Diagnosis not present

## 2020-08-04 DIAGNOSIS — E871 Hypo-osmolality and hyponatremia: Secondary | ICD-10-CM | POA: Diagnosis not present

## 2020-08-04 DIAGNOSIS — S72001D Fracture of unspecified part of neck of right femur, subsequent encounter for closed fracture with routine healing: Secondary | ICD-10-CM | POA: Diagnosis not present

## 2020-08-05 DIAGNOSIS — F0280 Dementia in other diseases classified elsewhere without behavioral disturbance: Secondary | ICD-10-CM | POA: Diagnosis not present

## 2020-08-05 DIAGNOSIS — G309 Alzheimer's disease, unspecified: Secondary | ICD-10-CM | POA: Diagnosis not present

## 2020-08-05 DIAGNOSIS — S72001D Fracture of unspecified part of neck of right femur, subsequent encounter for closed fracture with routine healing: Secondary | ICD-10-CM | POA: Diagnosis not present

## 2020-08-05 DIAGNOSIS — E871 Hypo-osmolality and hyponatremia: Secondary | ICD-10-CM | POA: Diagnosis not present

## 2020-08-05 DIAGNOSIS — I10 Essential (primary) hypertension: Secondary | ICD-10-CM | POA: Diagnosis not present

## 2020-08-05 DIAGNOSIS — E43 Unspecified severe protein-calorie malnutrition: Secondary | ICD-10-CM | POA: Diagnosis not present

## 2020-08-07 NOTE — Progress Notes (Signed)
COMMUNITY PALLIATIVE CARE RN NOTE  PATIENT NAME: Anna Curtis DOB: [birth year] MRN: [id number]  PRIMARY CARE PROVIDER: Josetta Huddle, MD  RESPONSIBLE PARTY:  Acct ID - Guarantor Home Phone Work Phone Relationship Acct Type  [id number] CHARDE, MACFARLANE [phone]  Self P/F     [address] [place], [place] [address]   Due to the COVID-19 crisis, this virtual check-in visit was done via telephone from my office and it was initiated and consent by this patient and or family.  PLAN OF CARE and INTERVENTION:  1. ADVANCE CARE PLANNING/GOALS OF CARE: Goal is for patient to continue to get stronger.  2. PATIENT/CAREGIVER EDUCATION: Safe mobility, fall prevention 3. DISEASE STATUS: Virtual check-in visit completed via telephone. Patient recently hospitalized from 07/07/20 to 07/13/20. Palliative care recommended to family a x-ray of her right hip after son reports that she has been experiencing increasing pain with ambulation. Unsure if patient had a fall and got herself up without family knowing. She was found to have a right femoral neck fracture and had a total hip arthroplasty. She is currently receiving in-home physical therapy. She is back to being ambulatory and continues to refuse using assistive devices. She has Ibuprofen available for pain. She is able to toilet herself, but family assists when she will allow. He feels that she is making progress. She continues to eat small snacks/meals throughout the day. Will continue to monitor.  HISTORY OF PRESENT ILLNESS: This is a 86 yo female with a diagnosis of Alzheimer's disease. She has a h/o squamous cell carcinoma of anal canal, HTN, and protein calorie malnutrition. Palliative carecontinues to follow patient andvisitsmonthly and PRN.   CODE STATUS: DNR  ADVANCED DIRECTIVES: Y MOST FORM: no PPS: 40%   (Duration of visit and documentation 30 minutes)   Daryl Eastern, RN BSN

## 2020-08-12 DIAGNOSIS — I10 Essential (primary) hypertension: Secondary | ICD-10-CM | POA: Diagnosis not present

## 2020-08-12 DIAGNOSIS — F0280 Dementia in other diseases classified elsewhere without behavioral disturbance: Secondary | ICD-10-CM | POA: Diagnosis not present

## 2020-08-12 DIAGNOSIS — G309 Alzheimer's disease, unspecified: Secondary | ICD-10-CM | POA: Diagnosis not present

## 2020-08-12 DIAGNOSIS — E43 Unspecified severe protein-calorie malnutrition: Secondary | ICD-10-CM | POA: Diagnosis not present

## 2020-08-12 DIAGNOSIS — E871 Hypo-osmolality and hyponatremia: Secondary | ICD-10-CM | POA: Diagnosis not present

## 2020-08-12 DIAGNOSIS — S72001D Fracture of unspecified part of neck of right femur, subsequent encounter for closed fracture with routine healing: Secondary | ICD-10-CM | POA: Diagnosis not present

## 2020-08-13 DIAGNOSIS — S72001D Fracture of unspecified part of neck of right femur, subsequent encounter for closed fracture with routine healing: Secondary | ICD-10-CM | POA: Diagnosis not present

## 2020-08-13 DIAGNOSIS — Z90710 Acquired absence of both cervix and uterus: Secondary | ICD-10-CM | POA: Diagnosis not present

## 2020-08-13 DIAGNOSIS — Z79891 Long term (current) use of opiate analgesic: Secondary | ICD-10-CM | POA: Diagnosis not present

## 2020-08-13 DIAGNOSIS — E860 Dehydration: Secondary | ICD-10-CM | POA: Diagnosis not present

## 2020-08-13 DIAGNOSIS — Z993 Dependence on wheelchair: Secondary | ICD-10-CM | POA: Diagnosis not present

## 2020-08-13 DIAGNOSIS — R638 Other symptoms and signs concerning food and fluid intake: Secondary | ICD-10-CM | POA: Diagnosis not present

## 2020-08-13 DIAGNOSIS — F0280 Dementia in other diseases classified elsewhere without behavioral disturbance: Secondary | ICD-10-CM | POA: Diagnosis not present

## 2020-08-13 DIAGNOSIS — N8110 Cystocele, unspecified: Secondary | ICD-10-CM | POA: Diagnosis not present

## 2020-08-13 DIAGNOSIS — E43 Unspecified severe protein-calorie malnutrition: Secondary | ICD-10-CM | POA: Diagnosis not present

## 2020-08-13 DIAGNOSIS — Z853 Personal history of malignant neoplasm of breast: Secondary | ICD-10-CM | POA: Diagnosis not present

## 2020-08-13 DIAGNOSIS — Z8673 Personal history of transient ischemic attack (TIA), and cerebral infarction without residual deficits: Secondary | ICD-10-CM | POA: Diagnosis not present

## 2020-08-13 DIAGNOSIS — Z85048 Personal history of other malignant neoplasm of rectum, rectosigmoid junction, and anus: Secondary | ICD-10-CM | POA: Diagnosis not present

## 2020-08-13 DIAGNOSIS — E871 Hypo-osmolality and hyponatremia: Secondary | ICD-10-CM | POA: Diagnosis not present

## 2020-08-13 DIAGNOSIS — Z7982 Long term (current) use of aspirin: Secondary | ICD-10-CM | POA: Diagnosis not present

## 2020-08-13 DIAGNOSIS — Z96641 Presence of right artificial hip joint: Secondary | ICD-10-CM | POA: Diagnosis not present

## 2020-08-13 DIAGNOSIS — I10 Essential (primary) hypertension: Secondary | ICD-10-CM | POA: Diagnosis not present

## 2020-08-13 DIAGNOSIS — Z87891 Personal history of nicotine dependence: Secondary | ICD-10-CM | POA: Diagnosis not present

## 2020-08-13 DIAGNOSIS — W19XXXD Unspecified fall, subsequent encounter: Secondary | ICD-10-CM | POA: Diagnosis not present

## 2020-08-13 DIAGNOSIS — Z9181 History of falling: Secondary | ICD-10-CM | POA: Diagnosis not present

## 2020-08-13 DIAGNOSIS — G309 Alzheimer's disease, unspecified: Secondary | ICD-10-CM | POA: Diagnosis not present

## 2020-08-17 DIAGNOSIS — F0280 Dementia in other diseases classified elsewhere without behavioral disturbance: Secondary | ICD-10-CM | POA: Diagnosis not present

## 2020-08-17 DIAGNOSIS — E43 Unspecified severe protein-calorie malnutrition: Secondary | ICD-10-CM | POA: Diagnosis not present

## 2020-08-17 DIAGNOSIS — I10 Essential (primary) hypertension: Secondary | ICD-10-CM | POA: Diagnosis not present

## 2020-08-17 DIAGNOSIS — E871 Hypo-osmolality and hyponatremia: Secondary | ICD-10-CM | POA: Diagnosis not present

## 2020-08-17 DIAGNOSIS — S72001D Fracture of unspecified part of neck of right femur, subsequent encounter for closed fracture with routine healing: Secondary | ICD-10-CM | POA: Diagnosis not present

## 2020-08-17 DIAGNOSIS — G309 Alzheimer's disease, unspecified: Secondary | ICD-10-CM | POA: Diagnosis not present

## 2020-08-20 DIAGNOSIS — E43 Unspecified severe protein-calorie malnutrition: Secondary | ICD-10-CM | POA: Diagnosis not present

## 2020-08-20 DIAGNOSIS — S72001D Fracture of unspecified part of neck of right femur, subsequent encounter for closed fracture with routine healing: Secondary | ICD-10-CM | POA: Diagnosis not present

## 2020-08-20 DIAGNOSIS — I10 Essential (primary) hypertension: Secondary | ICD-10-CM | POA: Diagnosis not present

## 2020-08-20 DIAGNOSIS — G309 Alzheimer's disease, unspecified: Secondary | ICD-10-CM | POA: Diagnosis not present

## 2020-08-20 DIAGNOSIS — F0280 Dementia in other diseases classified elsewhere without behavioral disturbance: Secondary | ICD-10-CM | POA: Diagnosis not present

## 2020-08-20 DIAGNOSIS — E871 Hypo-osmolality and hyponatremia: Secondary | ICD-10-CM | POA: Diagnosis not present

## 2020-08-21 DIAGNOSIS — I10 Essential (primary) hypertension: Secondary | ICD-10-CM | POA: Diagnosis not present

## 2020-08-21 DIAGNOSIS — G309 Alzheimer's disease, unspecified: Secondary | ICD-10-CM | POA: Diagnosis not present

## 2020-08-21 DIAGNOSIS — E871 Hypo-osmolality and hyponatremia: Secondary | ICD-10-CM | POA: Diagnosis not present

## 2020-08-21 DIAGNOSIS — S72001D Fracture of unspecified part of neck of right femur, subsequent encounter for closed fracture with routine healing: Secondary | ICD-10-CM | POA: Diagnosis not present

## 2020-08-21 DIAGNOSIS — F0280 Dementia in other diseases classified elsewhere without behavioral disturbance: Secondary | ICD-10-CM | POA: Diagnosis not present

## 2020-08-21 DIAGNOSIS — E43 Unspecified severe protein-calorie malnutrition: Secondary | ICD-10-CM | POA: Diagnosis not present

## 2020-08-25 DIAGNOSIS — I10 Essential (primary) hypertension: Secondary | ICD-10-CM | POA: Diagnosis not present

## 2020-08-25 DIAGNOSIS — G309 Alzheimer's disease, unspecified: Secondary | ICD-10-CM | POA: Diagnosis not present

## 2020-08-25 DIAGNOSIS — S72001D Fracture of unspecified part of neck of right femur, subsequent encounter for closed fracture with routine healing: Secondary | ICD-10-CM | POA: Diagnosis not present

## 2020-08-25 DIAGNOSIS — E871 Hypo-osmolality and hyponatremia: Secondary | ICD-10-CM | POA: Diagnosis not present

## 2020-08-25 DIAGNOSIS — E43 Unspecified severe protein-calorie malnutrition: Secondary | ICD-10-CM | POA: Diagnosis not present

## 2020-08-25 DIAGNOSIS — F0280 Dementia in other diseases classified elsewhere without behavioral disturbance: Secondary | ICD-10-CM | POA: Diagnosis not present

## 2020-08-27 DIAGNOSIS — E871 Hypo-osmolality and hyponatremia: Secondary | ICD-10-CM | POA: Diagnosis not present

## 2020-08-27 DIAGNOSIS — S72001D Fracture of unspecified part of neck of right femur, subsequent encounter for closed fracture with routine healing: Secondary | ICD-10-CM | POA: Diagnosis not present

## 2020-08-27 DIAGNOSIS — E43 Unspecified severe protein-calorie malnutrition: Secondary | ICD-10-CM | POA: Diagnosis not present

## 2020-08-27 DIAGNOSIS — G309 Alzheimer's disease, unspecified: Secondary | ICD-10-CM | POA: Diagnosis not present

## 2020-08-27 DIAGNOSIS — I10 Essential (primary) hypertension: Secondary | ICD-10-CM | POA: Diagnosis not present

## 2020-08-27 DIAGNOSIS — F0280 Dementia in other diseases classified elsewhere without behavioral disturbance: Secondary | ICD-10-CM | POA: Diagnosis not present

## 2020-09-01 DIAGNOSIS — I10 Essential (primary) hypertension: Secondary | ICD-10-CM | POA: Diagnosis not present

## 2020-09-01 DIAGNOSIS — G309 Alzheimer's disease, unspecified: Secondary | ICD-10-CM | POA: Diagnosis not present

## 2020-09-01 DIAGNOSIS — E43 Unspecified severe protein-calorie malnutrition: Secondary | ICD-10-CM | POA: Diagnosis not present

## 2020-09-01 DIAGNOSIS — F0280 Dementia in other diseases classified elsewhere without behavioral disturbance: Secondary | ICD-10-CM | POA: Diagnosis not present

## 2020-09-01 DIAGNOSIS — E871 Hypo-osmolality and hyponatremia: Secondary | ICD-10-CM | POA: Diagnosis not present

## 2020-09-01 DIAGNOSIS — S72001D Fracture of unspecified part of neck of right femur, subsequent encounter for closed fracture with routine healing: Secondary | ICD-10-CM | POA: Diagnosis not present

## 2020-09-02 ENCOUNTER — Other Ambulatory Visit: Payer: Medicare Other | Admitting: *Deleted

## 2020-09-02 ENCOUNTER — Other Ambulatory Visit: Payer: Self-pay

## 2020-09-02 DIAGNOSIS — Z515 Encounter for palliative care: Secondary | ICD-10-CM

## 2020-09-02 NOTE — Progress Notes (Signed)
COMMUNITY PALLIATIVE CARE RN NOTE  PATIENT NAME: Anna Curtis DOB: 12/21/1930 MRN: [id number]  PRIMARY CARE PROVIDER: Josetta Huddle, MD  RESPONSIBLE PARTY:  Acct ID - Guarantor Home Phone Work Phone Relationship Acct Type  [id number] Anna Curtis [phone]  Self P/F     [address], [place], [place] [address]   Due to the COVID-19 crisis, this virtual check-in visit was done via telephone from my office and it was initiated and consent by this patient and or family.  PLAN OF CARE and INTERVENTION:  1. ADVANCE CARE PLANNING/GOALS OF CARE: Goal is for patient to remain in her home. 2. PATIENT/CAREGIVER EDUCATION: N/A 3. DISEASE STATUS: Virtual check-in visit completed via telephone. Patient denies pain at this time and has not needed any pain medication. Patient has been working with PT/OT for several weeks s/p total hip arthroplasty. Her son reports that she has been doing really well. She has been walking good without the use of assistive devices. PT visited with her earlier today and she was able to ambulate 150 ft. Today was the last visit by PT. He believes that OT and RN has 2 visits left and will then discharge patient. She remains able to toilet herself at times, and other times may require assistance. She is intermittently incontinent of bladder. She continues with a good appetite and weight has been stable per son. No new issues or concerns. Will continue to monitor.  HISTORY OF PRESENT ILLNESS: This is a 86 yo female with a diagnosis of Alzheimer's disease. She has a h/o squamous cell carcinoma of anal canal, HTN, and protein calorie malnutrition. Palliative carecontinues to follow patient andvisitsmonthly and PRN.    CODE STATUS: DNR ADVANCED DIRECTIVES: Y MOST FORM: no PPS: 40%   (Duration of visit and documentation 15 minutes)   Daryl Eastern, RN BSN

## 2020-09-03 DIAGNOSIS — G309 Alzheimer's disease, unspecified: Secondary | ICD-10-CM | POA: Diagnosis not present

## 2020-09-03 DIAGNOSIS — F0280 Dementia in other diseases classified elsewhere without behavioral disturbance: Secondary | ICD-10-CM | POA: Diagnosis not present

## 2020-09-03 DIAGNOSIS — S72001D Fracture of unspecified part of neck of right femur, subsequent encounter for closed fracture with routine healing: Secondary | ICD-10-CM | POA: Diagnosis not present

## 2020-09-03 DIAGNOSIS — E871 Hypo-osmolality and hyponatremia: Secondary | ICD-10-CM | POA: Diagnosis not present

## 2020-09-03 DIAGNOSIS — E43 Unspecified severe protein-calorie malnutrition: Secondary | ICD-10-CM | POA: Diagnosis not present

## 2020-09-03 DIAGNOSIS — I10 Essential (primary) hypertension: Secondary | ICD-10-CM | POA: Diagnosis not present

## 2020-09-08 DIAGNOSIS — G309 Alzheimer's disease, unspecified: Secondary | ICD-10-CM | POA: Diagnosis not present

## 2020-09-08 DIAGNOSIS — S72001D Fracture of unspecified part of neck of right femur, subsequent encounter for closed fracture with routine healing: Secondary | ICD-10-CM | POA: Diagnosis not present

## 2020-09-08 DIAGNOSIS — I10 Essential (primary) hypertension: Secondary | ICD-10-CM | POA: Diagnosis not present

## 2020-09-08 DIAGNOSIS — E871 Hypo-osmolality and hyponatremia: Secondary | ICD-10-CM | POA: Diagnosis not present

## 2020-09-08 DIAGNOSIS — F0280 Dementia in other diseases classified elsewhere without behavioral disturbance: Secondary | ICD-10-CM | POA: Diagnosis not present

## 2020-09-08 DIAGNOSIS — E43 Unspecified severe protein-calorie malnutrition: Secondary | ICD-10-CM | POA: Diagnosis not present

## 2020-09-09 DIAGNOSIS — S72001D Fracture of unspecified part of neck of right femur, subsequent encounter for closed fracture with routine healing: Secondary | ICD-10-CM | POA: Diagnosis not present

## 2020-09-09 DIAGNOSIS — I10 Essential (primary) hypertension: Secondary | ICD-10-CM | POA: Diagnosis not present

## 2020-09-09 DIAGNOSIS — E871 Hypo-osmolality and hyponatremia: Secondary | ICD-10-CM | POA: Diagnosis not present

## 2020-09-09 DIAGNOSIS — F0280 Dementia in other diseases classified elsewhere without behavioral disturbance: Secondary | ICD-10-CM | POA: Diagnosis not present

## 2020-09-09 DIAGNOSIS — G309 Alzheimer's disease, unspecified: Secondary | ICD-10-CM | POA: Diagnosis not present

## 2020-09-09 DIAGNOSIS — E43 Unspecified severe protein-calorie malnutrition: Secondary | ICD-10-CM | POA: Diagnosis not present

## 2020-09-17 ENCOUNTER — Telehealth: Payer: Self-pay

## 2020-09-17 NOTE — Telephone Encounter (Signed)
(  11:37am) SW attempted to call patient/family to schedule a monthly palliative care visit. SW did not receive an answer, left message requesting a call back.

## 2020-09-28 ENCOUNTER — Telehealth: Payer: Self-pay

## 2020-09-28 NOTE — Telephone Encounter (Signed)
Telephone call to patient to schedule palliative care visit with patient. SW left message requesting a call back to schedule palliative care visit.

## 2020-10-11 ENCOUNTER — Other Ambulatory Visit: Payer: Self-pay

## 2020-10-11 ENCOUNTER — Emergency Department (HOSPITAL_COMMUNITY): Payer: Medicare Other

## 2020-10-11 ENCOUNTER — Encounter (HOSPITAL_COMMUNITY): Payer: Self-pay

## 2020-10-11 ENCOUNTER — Emergency Department (HOSPITAL_COMMUNITY)
Admission: EM | Admit: 2020-10-11 | Discharge: 2020-10-11 | Disposition: A | Discharge status: Home / Self Care | Payer: Medicare Other | Attending: Emergency Medicine | Admitting: Emergency Medicine

## 2020-10-11 DIAGNOSIS — G309 Alzheimer's disease, unspecified: Secondary | ICD-10-CM | POA: Diagnosis not present

## 2020-10-11 DIAGNOSIS — K5730 Diverticulosis of large intestine without perforation or abscess without bleeding: Secondary | ICD-10-CM | POA: Diagnosis not present

## 2020-10-11 DIAGNOSIS — Z96641 Presence of right artificial hip joint: Secondary | ICD-10-CM | POA: Diagnosis not present

## 2020-10-11 DIAGNOSIS — N390 Urinary tract infection, site not specified: Secondary | ICD-10-CM

## 2020-10-11 DIAGNOSIS — Z79899 Other long term (current) drug therapy: Secondary | ICD-10-CM | POA: Insufficient documentation

## 2020-10-11 DIAGNOSIS — Z7982 Long term (current) use of aspirin: Secondary | ICD-10-CM | POA: Insufficient documentation

## 2020-10-11 DIAGNOSIS — R109 Unspecified abdominal pain: Secondary | ICD-10-CM | POA: Insufficient documentation

## 2020-10-11 DIAGNOSIS — D509 Iron deficiency anemia, unspecified: Secondary | ICD-10-CM

## 2020-10-11 DIAGNOSIS — K625 Hemorrhage of anus and rectum: Secondary | ICD-10-CM

## 2020-10-11 DIAGNOSIS — K6389 Other specified diseases of intestine: Secondary | ICD-10-CM | POA: Diagnosis not present

## 2020-10-11 DIAGNOSIS — Z87891 Personal history of nicotine dependence: Secondary | ICD-10-CM | POA: Insufficient documentation

## 2020-10-11 DIAGNOSIS — Z853 Personal history of malignant neoplasm of breast: Secondary | ICD-10-CM | POA: Diagnosis not present

## 2020-10-11 DIAGNOSIS — Z8673 Personal history of transient ischemic attack (TIA), and cerebral infarction without residual deficits: Secondary | ICD-10-CM | POA: Insufficient documentation

## 2020-10-11 DIAGNOSIS — D3500 Benign neoplasm of unspecified adrenal gland: Secondary | ICD-10-CM | POA: Diagnosis not present

## 2020-10-11 DIAGNOSIS — I10 Essential (primary) hypertension: Secondary | ICD-10-CM | POA: Diagnosis not present

## 2020-10-11 DIAGNOSIS — S3210XA Unspecified fracture of sacrum, initial encounter for closed fracture: Secondary | ICD-10-CM | POA: Diagnosis not present

## 2020-10-11 LAB — COMPREHENSIVE METABOLIC PANEL
ALT: 31 U/L (ref 0–44)
AST: 39 U/L (ref 15–41)
Albumin: 3.1 g/dL — ABNORMAL LOW (ref 3.5–5.0)
Alkaline Phosphatase: 496 U/L — ABNORMAL HIGH (ref 38–126)
Anion gap: 7 (ref 5–15)
BUN: 20 mg/dL (ref 8–23)
CO2: 27 mmol/L (ref 22–32)
Calcium: 8.6 mg/dL — ABNORMAL LOW (ref 8.9–10.3)
Chloride: 105 mmol/L (ref 98–111)
Creatinine, Ser: 0.61 mg/dL (ref 0.44–1.00)
GFR, Estimated: >60 mL/min (ref >60)
Glucose, Bld: 111 mg/dL — ABNORMAL HIGH (ref 70–99)
Potassium: 4.9 mmol/L (ref 3.5–5.1)
Sodium: 139 mmol/L (ref 135–145)
Total Bilirubin: 0.8 mg/dL (ref 0.3–1.2)
Total Protein: 6.5 g/dL (ref 6.5–8.1)

## 2020-10-11 LAB — CBC WITH DIFFERENTIAL/PLATELET
Abs Immature Granulocytes: 0.02 10*3/uL (ref 0.00–0.07)
Basophils Absolute: 0 10*3/uL (ref 0.0–0.1)
Basophils Relative: 1 %
Eosinophils Absolute: 0.1 10*3/uL (ref 0.0–0.5)
Eosinophils Relative: 2 %
HCT: 28 % — ABNORMAL LOW (ref 36.0–46.0)
Hemoglobin: 8.7 g/dL — ABNORMAL LOW (ref 12.0–15.0)
Immature Granulocytes: 0 %
Lymphocytes Relative: 12 %
Lymphs Abs: 0.9 10*3/uL (ref 0.7–4.0)
MCH: 28.6 pg (ref 26.0–34.0)
MCHC: 31.1 g/dL (ref 30.0–36.0)
MCV: 92.1 fL (ref 80.0–100.0)
Monocytes Absolute: 0.7 10*3/uL (ref 0.1–1.0)
Monocytes Relative: 9 %
Neutro Abs: 5.7 10*3/uL (ref 1.7–7.7)
Neutrophils Relative %: 76 %
Platelets: 415 10*3/uL — ABNORMAL HIGH (ref 150–400)
RBC: 3.04 MIL/uL — ABNORMAL LOW (ref 3.87–5.11)
RDW: 16.1 % — ABNORMAL HIGH (ref 11.5–15.5)
WBC: 7.4 10*3/uL (ref 4.0–10.5)
nRBC: 0 % (ref 0.0–0.2)

## 2020-10-11 LAB — URINALYSIS, ROUTINE W REFLEX MICROSCOPIC
Bilirubin Urine: NEGATIVE
Glucose, UA: NEGATIVE mg/dL
Ketones, ur: NEGATIVE mg/dL
Nitrite: POSITIVE — AB
Protein, ur: 30 mg/dL — AB
RBC / HPF: >50 RBC/hpf — ABNORMAL HIGH (ref 0–5)
Specific Gravity, Urine: 1.02 (ref 1.005–1.030)
pH: 5 (ref 5.0–8.0)

## 2020-10-11 MED ORDER — CEPHALEXIN 500 MG PO CAPS: 500.0000 mg | ORAL_CAPSULE | Freq: Two times a day (BID) | ORAL | 0 refills | Status: AC

## 2020-10-11 MED ORDER — IOHEXOL 300 MG/ML  SOLN
100.0000 mL | Freq: Once | INTRAMUSCULAR | Status: AC | PRN
  Administered 2020-10-11: 100 mL via INTRAVENOUS

## 2020-10-11 NOTE — ED Notes (Addendum)
Pt. would not allow vitals to be taken.

## 2020-10-11 NOTE — ED Provider Notes (Signed)
Patient is a 86 year old female who presents with rectal bleeding.  Care was taken over from Dr. Karle Starch.  Per the son who is the caregiver, patient has had some spotting blood over the last couple of weeks.  It was a bit heavier since yesterday but he is still only noticing it really when he wipes her bottom more when she wipes.  There is not large amounts of bright red bleeding.  CT scan does not show any new masses.  There are some rectal thickening consistent with her prior rectal cancer.  Her hemoglobin has dropped from a baseline of around 10-8.7 today.  She is hemodynamically stable.  I had a long discussion with the son regarding admission versus close home observation.  She is very agitated in the hospital and he would prefer to take her home.  I had a long discussion about the fact that it was undecided whether on rectal exam, she had a hemorrhoid versus a new growth and we could not exclude rectal cancer.  The son is still deciding on how aggressively to further evaluate this given the patient's advanced dementia.  I did advise that if they plan on potentially treating a recurrence of rectal cancer, she will need to have that area relooked at by her rectal surgeon or oncologist.  I also advised the patient's son that its unclear at this point whether her hemoglobin is going to continue to drop or stabilized.  I did advise him that the most conservative route would be admission to the hospital for close observation however given the patient's difficulty in a hospital setting, he has opted to take her home.  We did discuss having close follow-up with her PCP in the next day or 2 to have her hemoglobin rechecked.  She will start taking her iron supplements at home and he has agreed to bring her back if she has any heavier bleeding or change in status.  She also had evidence of urinary tract infection.  She was started on Keflex.   Malvin Johns, MD 10/11/20 [phone]

## 2020-10-11 NOTE — ED Provider Notes (Signed)
Care of the patient assumed from [facility] at the request of patient's son. She has had rectal bleeding recently, history of rectal cancer in remission after radiation treatment last year. Had been on Hospice/Palliative care but taken off after going in remission.   Rectal: Chaperone present. Patient has small amount of bright red blood, hemorrhoids without thrombosis and/or fungating mass with some ulceration concerning for recurrence of rectal cancer. Tenderness and unable to tolerate digital exam.   MDM: Will send labs to check blood counts and elytes. CT to eval evidence of recurrence. She is not taking a blood thinner and is hemodynamically stable.   ED Course: Clinical Course as of Oct 11 1510  Sun Oct 11, 2020  1459 CBC shows anemia, mildly worsened from previous.    [CS]  5927 Care of the patient signed out to Dr. Tamera Punt at the change of shift.    [CS]    Clinical Course User Index [CS] Truddie Hidden, MD      Truddie Hidden, MD 10/11/20 [phone]

## 2020-10-11 NOTE — ED Provider Notes (Signed)
[facility] DEPT Provider Note   CSN: [id number] Arrival date & time: 10/11/20  1245     History Chief Complaint  Patient presents with  . Rectal Bleeding    Anna Curtis is a 86 y.o. female with past medical history of anal cancer status post radiation, severe dementia, hypertension, TIA that presents emergency department today for rectal bleeding and rectal pain.  Patient has primary caregiver, son at bedside who takes care of her.  Patient is level 5 caveat due to dementia.  Per son patient did not get any chemo, was symptom-free after radiation last year, started having rectal bleeding and rectal pain 2 weeks ago with bright red blood in depends.  No blood in urine.  Has been eating and drinking normally.  Patient is at baseline cognitively per son.  Denies any fevers, nausea, vomiting.  Denies any syncope.  States that she has been in her normal health besides the rectal bleeding and rectal pain.  Patient has not had diarrhea up until today.  Patient was palliative care , however is not on positive care anymore per son.    HPI     Past Medical History:  Diagnosis Date  . Bladder prolapse, female, acquired   . Cancer ([place])    breast 2002 LEFT RADIATION AND SX DONE  . Dementia ([place])   . Hypertension    OFF BP MEDS X 2 YEARS  . TIA (transient ischemic attack) 2014    Patient Active Problem List   Diagnosis Date Noted  . S/P total hip arthroplasty 07/08/2020  . Closed right hip fracture ([place]) 07/07/2020  . AMS (altered mental status) 07/07/2020  . Hospice care patient 10/25/2019  . Palliative care patient 10/25/2019  . Failure to thrive in adult 10/24/2019  . Severe protein-calorie malnutrition ([place]) 10/24/2019  . Decreased oral intake 10/24/2019  . Fall at home, initial encounter 10/23/2019  . Hypokalemia 10/23/2019  . Acute metabolic encephalopathy 72/53/6644  . Pressure injury of skin 10/23/2019  . Pain due to onychomycosis of  toenails of both feet 10/15/2019  . Alzheimer's disease ([place]) 08/09/2019  . Agitation 08/09/2019  . Squamous cell carcinoma of anal canal ([place]) 08/08/2019  . Ataxia 06/08/2013  . Diplopia 06/08/2013  . Dizziness 06/08/2013  . Sinusitis 06/08/2013  . HTN (hypertension) 06/08/2013  . Tobacco abuse 06/08/2013    Past Surgical History:  Procedure Laterality Date  . ABDOMINAL HYSTERECTOMY     2007  . BLADDER TACH    . BREAST SURGERY Left 2002   CANCER LUMPECTOMY  . HIP ARTHROPLASTY Right 07/08/2020   Procedure: ARTHROPLASTY BIPOLAR HIP (HEMIARTHROPLASTY);  Surgeon: Dorna Leitz, MD;  Location: [facility] ORS;  Service: Orthopedics;  Laterality: Right;     OB History    Gravida  0   Para  0   Term  0   Preterm  0   AB  0   Living        SAB  0   TAB  0   Ectopic  0   Multiple      Live Births              Family History  Problem Relation Age of Onset  . Cancer Mother   . Cancer Brother     Social History   Tobacco Use  . Smoking status: Former Smoker    Packs/day: 1.00    Years: 40.00    Pack years: 40.00    Types: Cigarettes  .  Smokeless tobacco: Never Used  . Tobacco comment: QUIT AGE 3  Vaping Use  . Vaping Use: Never used  Substance Use Topics  . Alcohol use: Yes    Alcohol/week: 1.0 standard drink    Types: 1 Glasses of wine per week    Comment: every night 1 OR 2 GLASSES WINE  . Drug use: No    Home Medications Prior to Admission medications   Medication Sig Start Date End Date Taking? Authorizing Provider  acetaminophen (TYLENOL) 325 MG tablet Take 1-2 tablets (325-650 mg total) by mouth every 6 (six) hours as needed for mild pain (pain score 1-3 or temp > 100.5). 07/13/20   Debbe Odea, MD  aspirin EC 325 MG EC tablet Take 1 tablet (325 mg total) by mouth daily. 07/13/20   Debbe Odea, MD  bisacodyl (DULCOLAX) 5 MG EC tablet Take 1 tablet (5 mg total) by mouth daily as needed for moderate constipation. 07/13/20   Debbe Odea, MD  docusate  sodium (COLACE) 100 MG capsule Take 1 capsule (100 mg total) by mouth 2 (two) times daily. 07/13/20   Debbe Odea, MD  feeding supplement, ENSURE ENLIVE, (ENSURE ENLIVE) LIQD Take 237 mLs by mouth 3 (three) times daily between meals. 07/13/20   Debbe Odea, MD  ferrous sulfate 325 (65 FE) MG tablet Take 1 tablet (325 mg total) by mouth 2 (two) times daily with a meal. 07/13/20   Debbe Odea, MD  HYDROcodone-acetaminophen (NORCO) 5-325 MG tablet Take 1 tablet by mouth every 6 (six) hours as needed (pain). 07/13/20   Dereck Leep, PA-C  ibuprofen (ADVIL) 200 MG tablet Take 1 tablet (200 mg total) by mouth every 6 (six) hours as needed. 07/13/20   Debbe Odea, MD  magnesium citrate SOLN Take 296 mLs (1 Bottle total) by mouth once as needed for severe constipation. 07/13/20   Debbe Odea, MD  polyethylene glycol (MIRALAX / GLYCOLAX) 17 g packet Take 17 g by mouth daily. 07/13/20   Debbe Odea, MD  senna (SENOKOT) 8.6 MG TABS tablet Take 1 tablet (8.6 mg total) by mouth at bedtime as needed for mild constipation. 07/13/20   Debbe Odea, MD  tiZANidine (ZANAFLEX) 2 MG tablet Take 1 tablet (2 mg total) by mouth every 8 (eight) hours as needed for muscle spasms. 07/13/20   Dereck Leep, PA-C    Allergies    Patient has no known allergies.  Review of Systems   Review of Systems  Unable to perform ROS: Dementia    Physical Exam Updated Vital Signs BP 130/61 (BP Location: Left Arm)   Pulse 68   Temp 97.8 F (36.6 C) (Oral)   Resp 16   Ht 5\' 6"  (1.676 m)   Wt 46.3 kg   SpO2 100%   BMI 16.46 kg/m   Physical Exam Constitutional:      General: She is not in acute distress.    Appearance: Normal appearance. She is not ill-appearing, toxic-appearing or diaphoretic.  HENT:     Head: Normocephalic and atraumatic.  Cardiovascular:     Rate and Rhythm: Normal rate and regular rhythm.     Pulses: Normal pulses.  Pulmonary:     Effort: Pulmonary effort is normal.     Breath sounds: Normal  breath sounds.  Skin:    General: Skin is warm and dry.  Neurological:     General: No focal deficit present.     Mental Status: She is alert. Mental status is at baseline.  Psychiatric:  Mood and Affect: Mood normal.        Behavior: Behavior normal.        Thought Content: Thought content normal.     ED Results / Procedures / Treatments   Labs (all labs ordered are listed, but only abnormal results are displayed) Labs Reviewed - No data to display  EKG None  Radiology No results found.  Procedures Procedures (including critical care time)  Medications Ordered in ED Medications - No data to display  ED Course  I have reviewed the triage vital signs and the nursing notes.  Pertinent labs & imaging results that were available during my care of the patient were reviewed by me and considered in my medical decision making (see chart for details).    MDM Rules/Calculators/A&P                         Anna Curtis is a 86 y.o. female with past medical history of anal cancer status post radiation, severe dementia, hypertension, TIA that presents emergency department today for rectal bleeding and rectal pain  During evaluation, son was extremely rude, expressed to me that I was not qualified to do rectal exam or finish my assessment.  States that he does not want to see a physician assistant and only wants to see a doctor.  Would not let me do rectal exam or finished my physical exam.  Dr. Karle Starch aware.  Patient handed off to Dr. Karle Starch. Final Clinical Impression(s) / ED Diagnoses Final diagnoses:  Rectal bleeding    Rx / DC Orders ED Discharge Orders    None       Alfredia Client, PA-C 10/11/20 1407    Truddie Hidden, MD 10/11/20 1434

## 2020-10-11 NOTE — Discharge Instructions (Addendum)
Follow-up with her doctor for recheck on her blood counts within the next 1 to 2 days.  Return here if she has any worsening symptoms including change in her mental status, heavier bleeding, fevers or other worsening symptoms.

## 2020-10-11 NOTE — ED Triage Notes (Signed)
Pt presents with c/o rectal bleeding. Pt has dementia, caregiver at bedside, reports that she has been passing spots of bleeding but that over the last few days, the bleeding has become more severe.

## 2020-10-12 ENCOUNTER — Other Ambulatory Visit: Payer: Medicare Other | Admitting: *Deleted

## 2020-10-12 DIAGNOSIS — Z515 Encounter for palliative care: Secondary | ICD-10-CM

## 2020-10-14 LAB — URINE CULTURE: Culture: >=100000 — AB

## 2020-10-15 ENCOUNTER — Telehealth: Payer: Self-pay

## 2020-10-15 NOTE — Telephone Encounter (Signed)
Post ED Visit - Positive Culture Follow-up  Culture report reviewed by antimicrobial stewardship pharmacist: [place] Team []  Elenor Quinones, Pharm.D. []  Heide Guile, Pharm.D., BCPS AQ-ID []  Parks Neptune, Pharm.D., BCPS []  Alycia Rossetti, Pharm.D., BCPS []  [place], Pharm.D., BCPS, AAHIVP []  Legrand Como, Pharm.D., BCPS, AAHIVP []  Salome Arnt, PharmD, BCPS []  Johnnette Gourd, PharmD, BCPS []  Hughes Better, PharmD, BCPS []  Leeroy Cha, PharmD []  Laqueta Linden, PharmD, BCPS []  Albertina Parr, PharmD  [place] Team []  Leodis Sias, PharmD []  Lindell Spar, PharmD []  Royetta Asal, PharmD []  Graylin Shiver, Rph []  Rema Fendt) Glennon Mac, PharmD []  Arlyn Dunning, PharmD []  Netta Cedars, PharmD []  Dia Sitter, PharmD []  Leone Haven, PharmD []  Gretta Arab, PharmD []  Theodis Shove, PharmD []  Peggyann Juba, PharmD []  Reuel Boom, PharmD  Rennis Harding D  Positive urine culture Treated with Cephalexin, organism sensitive to the same and no further patient follow-up is required at this time.  Genia Del 10/15/2020, 11:35 AM

## 2020-11-11 NOTE — Progress Notes (Signed)
COMMUNITY PALLIATIVE CARE RN NOTE  PATIENT NAME: Anna Curtis DOB: 12-24-1930 MRN: [id number]  PRIMARY CARE PROVIDER: Josetta Huddle, MD  RESPONSIBLE PARTY:  Acct ID - Guarantor Home Phone Work Phone Relationship Acct Type  [id number] AYAHNA, SOLAZZO [phone]  Self P/F     [address], [place], [place] [address]   Due to the COVID-19 crisis, this virtual check-in visit was done via telephone from my office and it was initiated and consent by this patient and or family.  PLAN OF CARE and INTERVENTION:  1. ADVANCE CARE PLANNING/GOALS OF CARE: Goal is for patient to remain in her home. She has a DNR. 2. PATIENT/CAREGIVER EDUCATION: Symptom management, safe mobility 3. DISEASE STATUS: Virtual check-in visit completed via telephone. Patient denies pain at this time. [name] reports that patient remains ambulatory without the use of assistive devices. Her mobility has improved since her hip surgery s/p right hip fracture in 07/2020. She has completed all in home therapies. She was taken to the ED yesterday d/t rectal bleeding. Bleeding was spotty for about a week, but heavier yesterday. Unsure if the cause was a hemorrhoid versus a new growth, since she has a history of rectal cancer. Her hgb dropped from 10 to 8.7 but otherwise stable pverall so was released back to home. She continues with a good appetite. She is feeling more tired today per son, but no other changes in condition or concerns. Will continue to monitor.   HISTORY OF PRESENT ILLNESS: This is a 86 yo female with a diagnosis of Alzheimer's disease. She has a h/o squamous cell carcinoma of anal canal, HTN, and protein calorie malnutrition. Palliative carecontinues to follow patient andvisitsmonthly and PRN.    CODE STATUS: DNR ADVANCED DIRECTIVES: Y MOST FORM: no PPS: 50%    (Duration of visit and documentation 20 minutes)   Daryl Eastern, RN BSN

## 2020-11-18 ENCOUNTER — Other Ambulatory Visit: Payer: Medicare Other

## 2020-11-18 DIAGNOSIS — Z515 Encounter for palliative care: Secondary | ICD-10-CM

## 2020-11-19 ENCOUNTER — Other Ambulatory Visit: Payer: Self-pay

## 2020-11-27 NOTE — Progress Notes (Signed)
COMMUNITY PALLIATIVE CARE SW NOTE  PATIENT NAME: CORIE ALLIS DOB: 09-13-1931 MRN: [id number]  PRIMARY CARE PROVIDER: Josetta Huddle, MD  RESPONSIBLE PARTY:  Acct ID - Guarantor Home Phone Work Phone Relationship Acct Type  [id number] CATALIA, [phone]  Self P/F     [address], [place], [place] [address]   Due to the COVID-19, this visit was done via telephone from my office and it was initiated and consent by this patient and/or family.   PLAN OF CARE and INTERVENTIONS:             1. GOALS OF CARE/ ADVANCE CARE PLANNING:  Goal is for patient to remain in her home.  She is a DNR. 1.  2. SOCIAL/EMOTIONAL/SPIRITUAL ASSESSMENT/ INTERVENTIONS:  SW completed a virtual check-in visit with patient's daughter-[name]. Patient is ambulating independently and without any issues. [name] report that patient continues to have blood in her stools, but this was not a new issue. Her appetite has been good. Patient has not had any recent falls or emergency room visits. The daughter feels that patient could benefit from a hospital bed. SW advised her to contact the patient's PCP regarding this request. Her appetite remains good. She continues to have fatigue. Her son remains her primary caregiver. Patient's daughter remain receptive to ongoing palliative care visits and support.  3. PATIENT/CAREGIVER EDUCATION/ COPING:  Patient and daughter coping well.  4. PERSONAL EMERGENCY PLAN:  911 can be activated for emergencies. 5. COMMUNITY RESOURCES COORDINATION/ HEALTH CARE NAVIGATION:  SW reinforced access to palliative care support. 6. FINANCIAL/LEGAL CONCERNS/INTERVENTIONS:  None.     SOCIAL HX:  Social History   Tobacco Use  . Smoking status: Former Smoker    Packs/day: 1.00    Years: 40.00    Pack years: 40.00    Types: Cigarettes  . Smokeless tobacco: Never Used  . Tobacco comment: QUIT AGE 16  Substance Use Topics  . Alcohol use: Yes    Alcohol/week: 1.0 standard drink    Types: 1  Glasses of wine per week    Comment: every night 1 OR 2 GLASSES WINE    CODE STATUS: DNR ADVANCED DIRECTIVES:Yes MOST FORM COMPLETE:  No HOSPICE EDUCATION PROVIDED: No  PPS: No changes in patient status.  Duration of telephonic visit and documentation:  30 minutes.      [address] [place], [place]

## 2020-11-30 DIAGNOSIS — R5381 Other malaise: Secondary | ICD-10-CM | POA: Diagnosis not present

## 2020-11-30 DIAGNOSIS — R269 Unspecified abnormalities of gait and mobility: Secondary | ICD-10-CM | POA: Diagnosis not present

## 2020-11-30 DIAGNOSIS — C210 Malignant neoplasm of anus, unspecified: Secondary | ICD-10-CM | POA: Diagnosis not present

## 2020-12-16 ENCOUNTER — Telehealth: Payer: Self-pay | Admitting: *Deleted

## 2020-12-16 NOTE — Telephone Encounter (Signed)
Called and left a voicemail for patient's son, [name], to schedule a palliative care visit. Contact information provided for return call.

## 2020-12-23 ENCOUNTER — Other Ambulatory Visit: Payer: Medicare Other | Admitting: *Deleted

## 2020-12-23 ENCOUNTER — Other Ambulatory Visit: Payer: Self-pay

## 2020-12-23 DIAGNOSIS — Z515 Encounter for palliative care: Secondary | ICD-10-CM

## 2021-01-05 NOTE — Progress Notes (Signed)
COMMUNITY PALLIATIVE CARE RN NOTE  PATIENT NAME: Anna Curtis DOB: 07-13-1931 MRN: [id number]  PRIMARY CARE PROVIDER: Josetta Huddle, MD  RESPONSIBLE PARTY:  Acct ID - Guarantor Home Phone Work Phone Relationship Acct Type  [id number] JAIYAH, [phone]  Self P/F     [address] [place], [place] [address]   Due to the COVID-19 crisis, this virtual check-in visit was done via telephone from my office and it was initiated and consent by this patient and or family.  PLAN OF CARE and INTERVENTION:  1. ADVANCE CARE PLANNING/GOALS OF CARE: Goal is for patient to remain in her home. She has a DNR.  2. PATIENT/CAREGIVER EDUCATION: Symptom management, pain management, safe mobility/transfers, s/s of infection 3. DISEASE STATUS: Virtual check-in visit completed via telephone. Patient is starting to experience increased rectal pain. Her son, [name], is noticing that patient is unable to sit down on her bottom for long periods of time due to discomfort. He feels this is a result of worsening rectal cancer and growth. He is applying the Hydrocortisone hemorrhoidal cream to help. He says her anus is red, with some blood and tiny tears noted. Patient often does not allow him to go into the bathroom with her, but afterwards he says he is not noticing any constipation or hard stools. He feels that this area may be infected. Patient is spending more time lying in bed d/t this discomfort. She remains ambulatory, but is starting to slow down overall. He feels her appetite is still good. No issues with swallowing. She does not take any routine medications. He is requesting antibiotics for possible infection. I called and left a voicemail with Dr. Geraldo Docker office to provide patient update and inform of son's request. Awaiting a return call. Will continue to monitor.   HISTORY OF PRESENT ILLNESS: This is a 87 yo female with a diagnosis of Alzheimer's disease. She has a h/o squamous cell carcinoma of anal canal,  HTN, and protein calorie malnutrition. Palliative carecontinues to follow patient andvisitsmonthly and PRN.    CODE STATUS: DNR ADVANCED DIRECTIVES: Y MOST FORM: no PPS: 50%   (Duration of visit and documentation 20 minutes)   Daryl Eastern, RN BSN

## 2021-01-12 ENCOUNTER — Other Ambulatory Visit: Payer: Self-pay

## 2021-01-12 ENCOUNTER — Other Ambulatory Visit: Payer: Medicare Other

## 2021-01-12 DIAGNOSIS — Z515 Encounter for palliative care: Secondary | ICD-10-CM

## 2021-01-14 NOTE — Progress Notes (Signed)
COMMUNITY PALLIATIVE CARE SW NOTE  PATIENT NAME: Anna Curtis DOB: 1931-10-06 MRN: [id number]  PRIMARY CARE PROVIDER: Josetta Huddle, MD  RESPONSIBLE PARTY:  Acct ID - Guarantor Home Phone Work Phone Relationship Acct Type  [id number] Anna [phone]  Self P/F     [address] [place], [place] [address]   Due to the COVID-19, this visit was done via telephone from my office and it was initiated and consent by this patient and/or family.   PLAN OF CARE and INTERVENTIONS:             1. GOALS OF CARE/ ADVANCE CARE PLANNING: Goal is for patient to remain in her home. She is a DNR. 1.  2. SOCIAL/EMOTIONAL/SPIRITUAL ASSESSMENT/ INTERVENTIONS:  SW complete a virtual check-in visit with patient via telephone. SW received a status update from Anna Curtis/PCG-Anna Curtis reported that patient was doing well overall. She continues to ambulate independently, but slowly. Her appetite is good. Anna Curtis report that patient appears to be more tired. Patient is having intermittent bleeding from her rectum. He feels that her rectal cancer maybe back. He is using Cortizone cream and he report that he has felt something hard in that area.  Patient is unable to sit comfortably on her bottom for long periods of time. He feels that patient may need an antibiotic. He may try to take patient to see her physician if this continues. SW advised him that he will update the palliative care RN for feedback. Anna Curtis remains open to ongoing palliative care support. SW provided active listening, supportive counseling, update to palliative care RN for follow-up and reassurance of support.  3. PATIENT/CAREGIVER EDUCATION/ COPING:  The family is coping well. Anna Curtis and Curtis are actively involved in her care. 4. PERSONAL EMERGENCY PLAN:  911 to be activated for emergencies.  5. COMMUNITY RESOURCES COORDINATION/ HEALTH CARE NAVIGATION: SW reinforced access to palliative care support.   6. FINANCIAL/LEGAL CONCERNS/INTERVENTIONS:  None.     SOCIAL HX:  Social History   Tobacco Use  . Smoking status: Former Smoker    Packs/day: 1.00    Years: 40.00    Pack years: 40.00    Types: Cigarettes  . Smokeless tobacco: Never Used  . Tobacco comment: QUIT AGE 69  Substance Use Topics  . Alcohol use: Yes    Alcohol/week: 1.0 standard drink    Types: 1 Glasses of wine per week    Comment: every night 1 OR 2 GLASSES WINE    CODE STATUS: DNR ADVANCED DIRECTIVES: Yes MOST FORM COMPLETE:  Yes HOSPICE EDUCATION PROVIDED: No  PPS: Patient continues to slowly ambulate; she is having increased intermittent bleeding and her appetite remains good.   Duration of telephonic visit and documentation: 30 minutes.      [address] [place], [place]

## 2021-01-24 IMAGING — CT CT ABDOMEN AND PELVIS WITHOUT CONTRAST
1 of 2 series · 14 of 32 positions shown, 19 images · non-contrast
Comparison: None.

CLINICAL DATA: 88-year-old female with rectal mass identified on
clinical examination.

EXAM:
CT ABDOMEN AND PELVIS WITHOUT CONTRAST
TECHNIQUE: Multidetector CT imaging of the abdomen and pelvis was performed
following the standard protocol without IV contrast.

[Series 2: abd/pelvis w/(date) · axial · 0.63mm/px · z∈[-453,-83]mm · 14 of 82 slices shown, 19 images]
[im 4/82  soft-tissue]
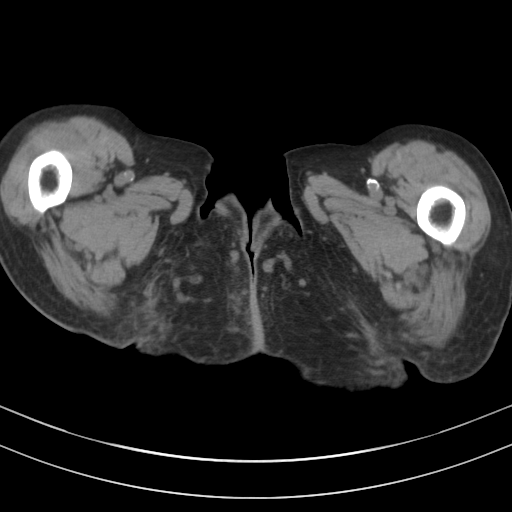
[im 4/82  bone]
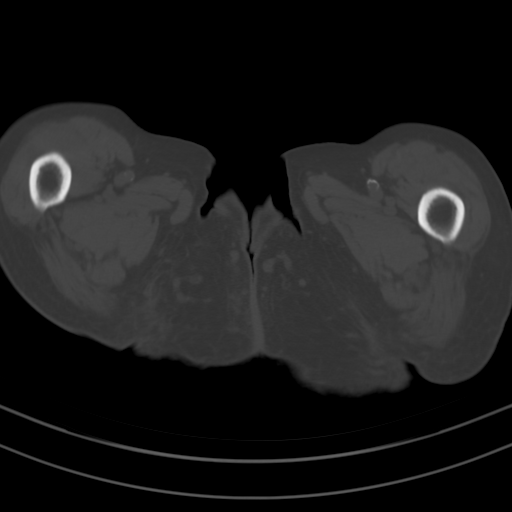
[im 12/82  soft-tissue]
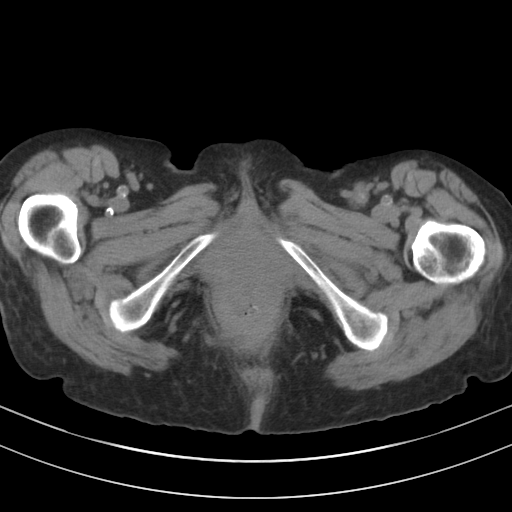
[im 16/82  soft-tissue]
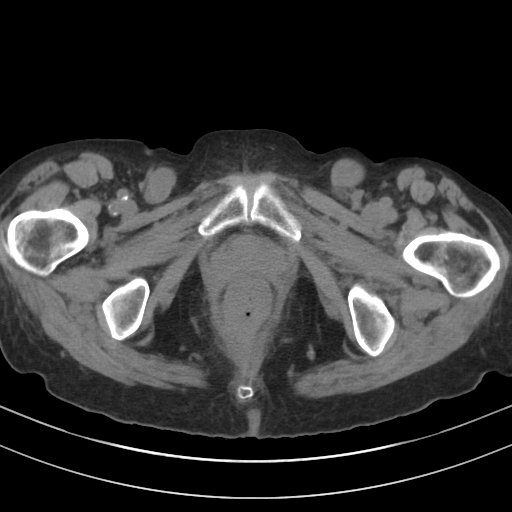
[im 24/82  soft-tissue]
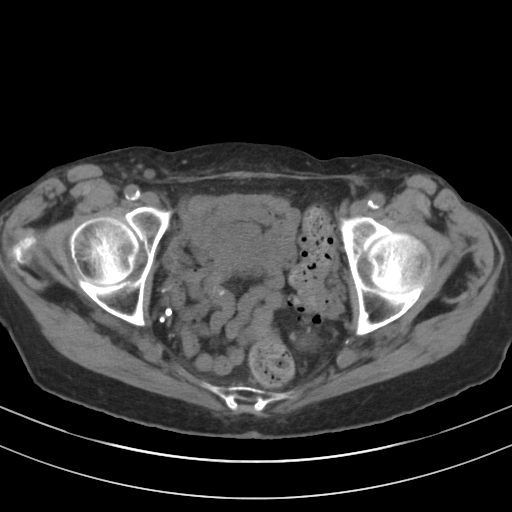
[im 28/82  soft-tissue]
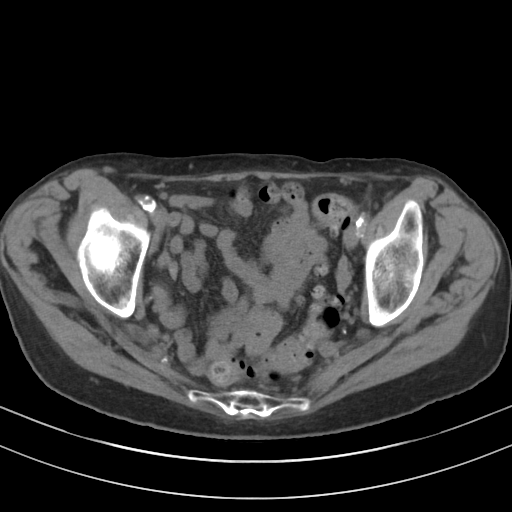
[im 35/82  soft-tissue]
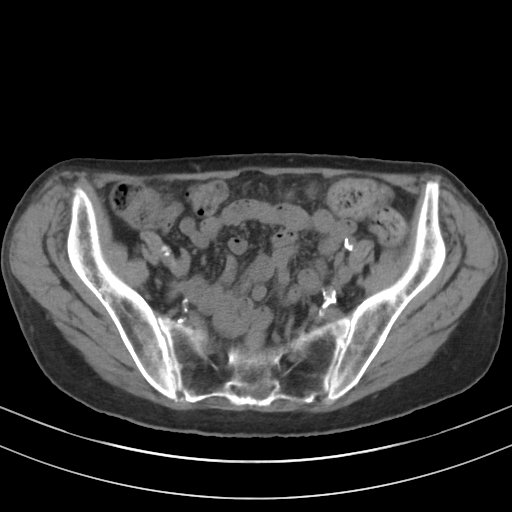
[im 43/82  soft-tissue]
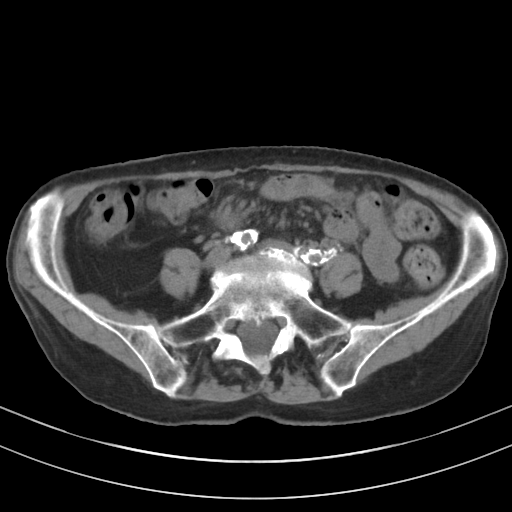
[im 47/82  soft-tissue]
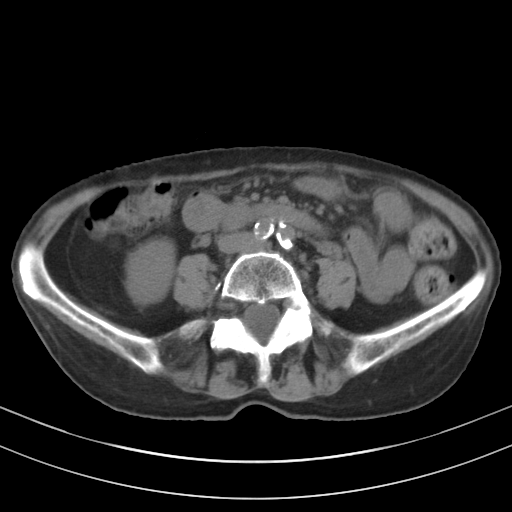
[im 55/82  soft-tissue]
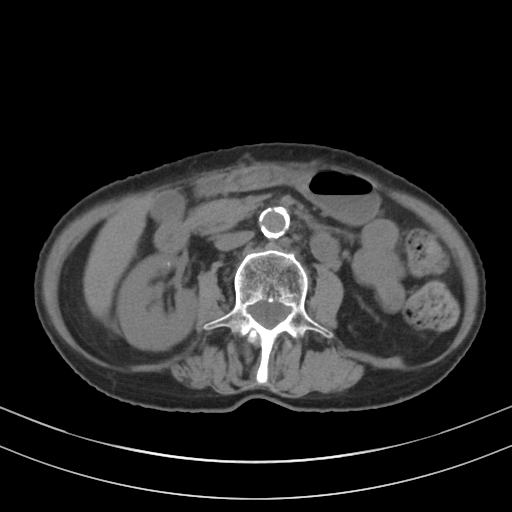
[im 55/82  bone]
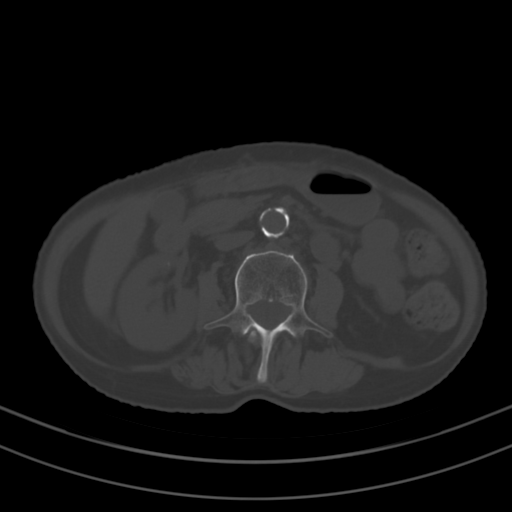
[im 58/82  soft-tissue]
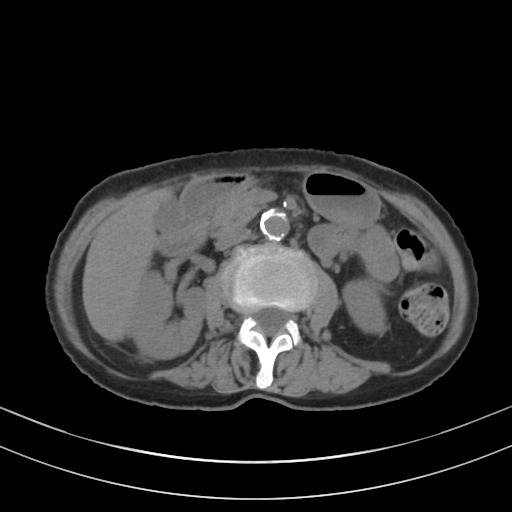
[im 66/82  soft-tissue]
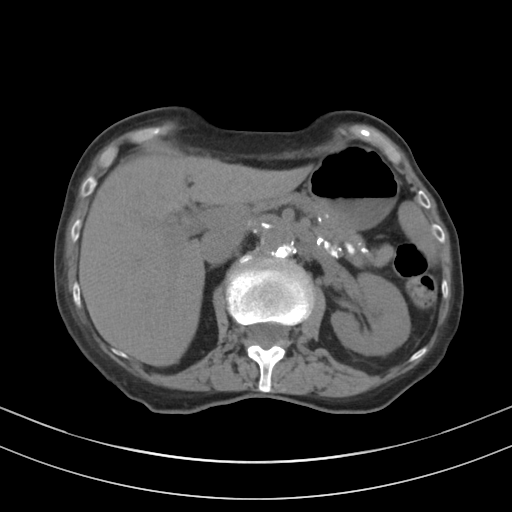
[im 66/82  lung]
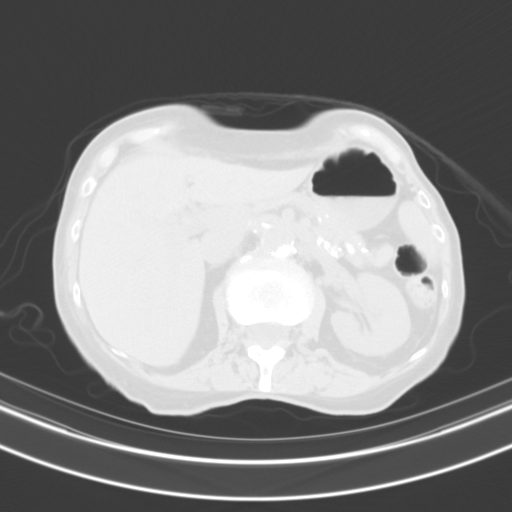
[im 70/82  soft-tissue]
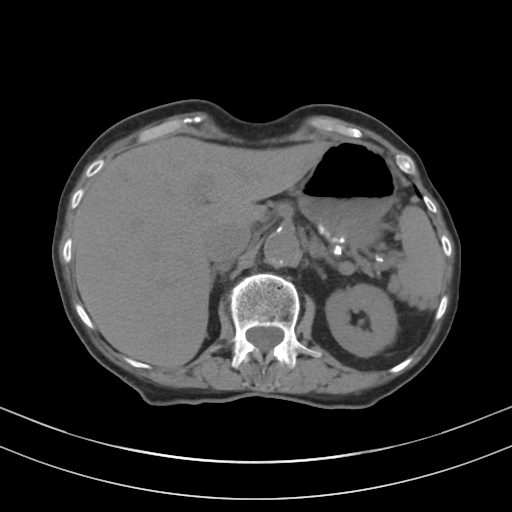
[im 70/82  lung]
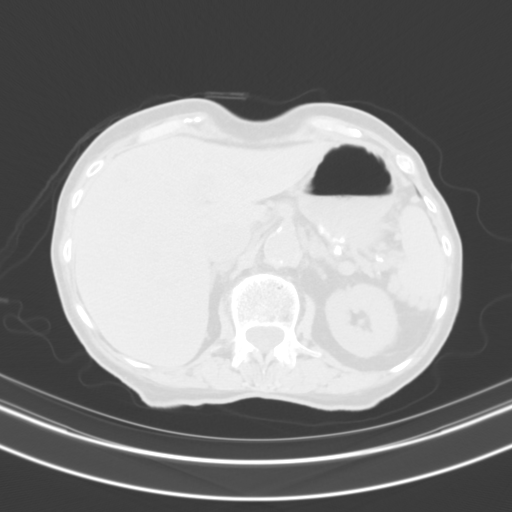
[im 74/82  lung]
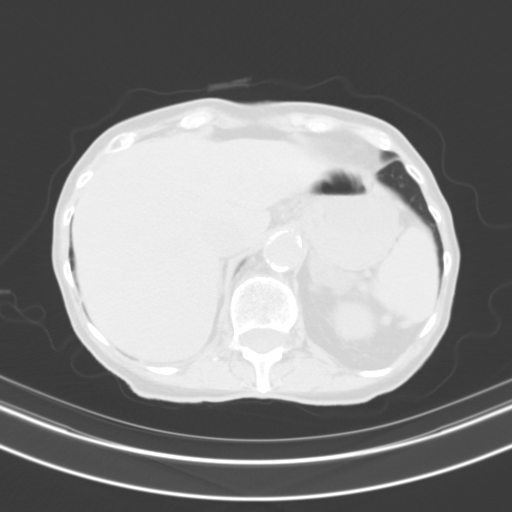
[im 78/82  soft-tissue]
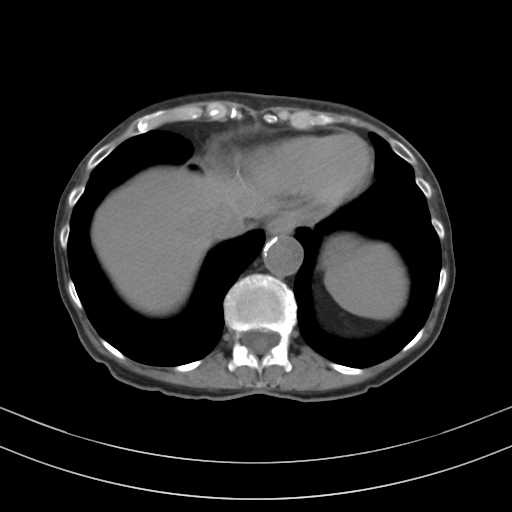
[im 78/82  lung]
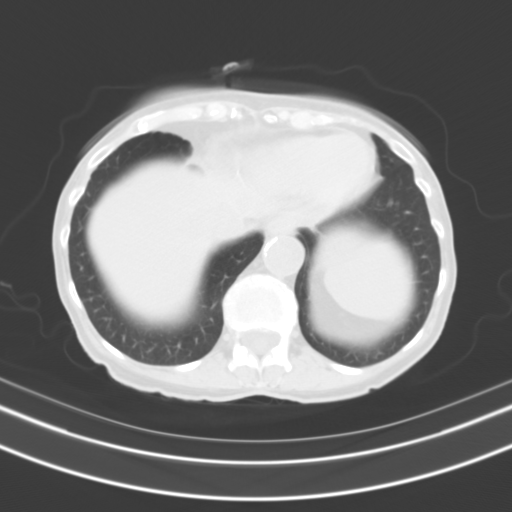

[14 of 32 positions shown; findings below may reference images not displayed]

FINDINGS: Please note that parenchymal abnormalities may be missed without
intravenous contrast.

Lower chest: No acute abnormality.  The lung bases are clear.

Hepatobiliary: No definite hepatic abnormality noted, but decreased
sensitivity without IV contrast. The gallbladder is unremarkable. No
biliary dilatation.

Pancreas: Unremarkable

Spleen: Unremarkable.

Adrenals/Urinary Tract: The kidneys, RIGHT adrenal gland and bladder
are unremarkable. Low-density adreniform enlargement of the LEFT
adrenal gland is noted.

Stomach/Bowel: Soft tissue prominence/wall thickening of the rectum
(with approximate length of at least 4 cm) noted likely representing
this patient's known rectal mass. There are small adjacent
perirectal lymph nodes noted. There is no evidence of bowel
obstruction or other definite areas of bowel wall thickening.
Colonic diverticulosis noted without evidence of diverticulitis.

The appendix is normal.

Vascular/Lymphatic: Aortic atherosclerosis. Enlarged bilateral
inguinal lymph nodes are noted the index RIGHT inguinal node
measuring 1.5 x 2.5 cm and index LEFT inguinal node measuring 2 x
3.2 cm.

Reproductive: Status post hysterectomy. No adnexal masses.

Other: No ascites, focal collection or pneumoperitoneum.

Musculoskeletal: No acute or suspicious bony abnormalities are
identified.
IMPRESSION: 1. Apparent soft tissue prominence/wall thickening of the rectum
suspicious for malignancy given history. Mildly enlarged perirectal
lymph nodes and enlarged bilateral inguinal lymph nodes may
represent metastatic disease.
2. No other definite evidence of metastatic disease, but evaluation
of the solid viscera is slightly limited without intravenous
contrast.
3.  Aortic Atherosclerosis ([id number]).

## 2021-02-25 IMAGING — PT NM PET TUM IMG INITIAL (PI) SKULL BASE T - THIGH
1 of 8 series · 1 of 25 positions shown · non-contrast
Comparison: Abdominopelvic CT of 07/18/2019

CLINICAL DATA: Initial treatment strategy for anal cancer. Initial
workup. Severe dementia.

EXAM:
NUCLEAR MEDICINE PET SKULL BASE TO THIGH
TECHNIQUE: 5.4 mCi F-18 FDG was injected intravenously. Full-ring PET imaging
was performed from the skull base to thigh after the radiotracer. CT
data was obtained and used for attenuation correction and anatomic
localization.
Fasting blood glucose: 82 mg/dl

[Series 4: ct sk_thigh 5.0 b31f · axial · 5.0mm · 0.91mm/px · 1 of 198 slices shown]
[im 198/198  brain]
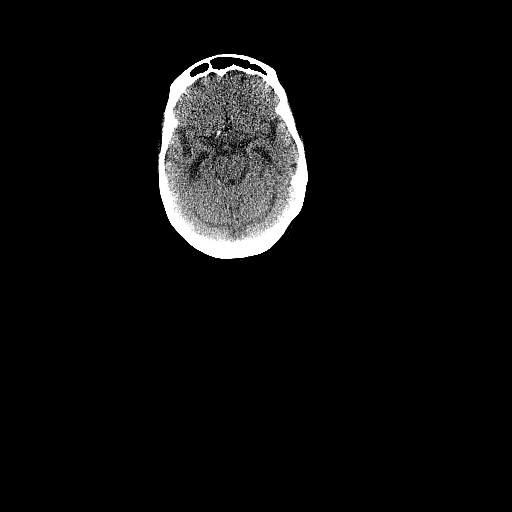

[1 of 25 positions shown; findings below may reference images not displayed]

FINDINGS: Mediastinal blood pool activity: SUV max

Liver activity: SUV max NA

NECK: No areas of abnormal hypermetabolism.

Incidental CT findings: Bilateral carotid atherosclerosis. No
cervical adenopathy.

CHEST: No pulmonary parenchymal or thoracic nodal hypermetabolism.

Incidental CT findings: Tortuous thoracic aorta. Mild cardiomegaly.
Aortic and coronary artery atherosclerosis. Moderate centrilobular
emphysema.

ABDOMEN/PELVIS: No abnormal abdominal activity.

Low rectal and anal wall thickening with hypermetabolism. This
measures a S.U.V. max of 13.1, including on 172/4. There perirectal
nodes which are suspicious based on size and location but not well
evaluated secondary to activity of adjacent primary. Example at 7 mm
at the 3 o'clock position on 168/4.

Bilateral centrally necrotic upper inguinal nodes. Example right
inguinal node of 1.8 cm and a S.U.V. max of 4.2 on 170/4.

Diffusely hypermetabolic lower left inguinal node measures 1.6 x
cm and a S.U.V. max of 6.5 on image 180/4.

Incidental CT findings: Deferred to recent diagnostic CT. Pelvic
floor laxity.

SKELETON: No abnormal marrow activity.

Incidental CT findings: Osteopenia.
IMPRESSION: 1. Anorectal primary with perirectal and bilateral inguinal nodal
metastasis.
2. No evidence of hypermetabolic extrapelvic metastasis.

## 2021-03-08 ENCOUNTER — Other Ambulatory Visit: Payer: Self-pay

## 2021-03-08 ENCOUNTER — Other Ambulatory Visit: Payer: Medicare Other | Admitting: *Deleted

## 2021-03-08 DIAGNOSIS — Z515 Encounter for palliative care: Secondary | ICD-10-CM

## 2021-03-09 ENCOUNTER — Telehealth: Payer: Self-pay | Admitting: *Deleted

## 2021-03-09 NOTE — Telephone Encounter (Signed)
Received a call back from [place], with Dr. Inda Merlin office. She advised that Dr. Inda Merlin gave the verbal order for a hospice consult and agrees to be the attending for patient while under hospice care. Will send this information to [facility]'s referral center and make son [name] aware.

## 2021-03-09 NOTE — Progress Notes (Signed)
COMMUNITY PALLIATIVE CARE RN NOTE  PATIENT NAME: Anna Curtis DOB: 1931-06-04 MRN: [id number]  PRIMARY CARE PROVIDER: Josetta Huddle, MD  RESPONSIBLE PARTY:  Acct ID - Guarantor Home Phone Work Phone Relationship Acct Type  [id number] [name], [name] [phone]  Self P/F     [address] [place], [place] [address]   Covid-19 Pre-screening Negative  PLAN OF CARE and INTERVENTION:  1. ADVANCE CARE PLANNING/GOALS OF CARE: Goal is for patient to remain in her home, avoid hospitalizations. Comfort measures only. 2. PATIENT/CAREGIVER EDUCATION: Symptom management, hospice education, safe mobility, s/s of infection 3. DISEASE STATUS: Met with patient, son Anna Curtis, and daughter Anna Curtis, in patient's home. Upon arrival, patient is lying down on the couch on her left side eating an ice cream popsicle. She is awake and alert, but confused. She does have some nonsensical and unintelligible speech, mixed with some clear words. She is often uncooperative and resistant to care. On previous visits, patient would usually be up walking around continuously and independently without the use of assistive devices. She was also able to stand without assistance. She now requires assistance with standing. She requires 1 person assistance with bathing, dressing, toileting and feeding. She is ambulatory, but spends most of her time lying down now. She has slowed down significantly overall. In November 2021, she was seen in the ED for rectal bleeding and was said to be coming from hemorrhoids. She also had a CT scan done at that time which showed persistent circumferential wall thickening at the anal verge but had improved from a past surgery. She did not have any anal masses at that time. After this ED visit, Anna Curtis requested a hospice referral but patient was deemed ineligible for hospice at that time. Anna Curtis states that she now has a rectal mass that has grown that is felt when he cleans her up after having a bowel movement. She is  unable to sit up at the kitchen table for more than 10-15 minutes before stating that her bottom hurts and needing to lie back down. She does have an appetite, but eats much less than she used to. She appears thinner with apparent generalized muscle wasting. She is now completely incontinent of both bowel and bladder. She has several bowel movements per day. A small amount of blood is noted a few times per week with bowel movements. Her skin now appears jaundiced which is also a new finding. Scattered bruising noted to bilateral arms. Spoke with our Immunologist, Dr. Clifton Curtis who feels that patient is now eligible for hospice services. Called and left a voicemail for patient's PCP to request a verbal order for a hospice consult and if Dr. Inda Curtis will agree to be patient's attending. Call made after 4pm so I most likely won't receive a call back until in the morning. Called and spoke with Anna Curtis to make him aware. He is Patent attorney. Both he and his sister Anna Curtis are on board with hospice.   HISTORY OF PRESENT ILLNESS:This is a 87 yo female with a diagnosis of Alzheimer's disease. She has a h/o squamous cell carcinoma of anal canal, HTN, and protein calorie malnutrition. Palliative carecontinues to follow patient andvisitsmonthly and PRN.     CODE STATUS: DNR ADVANCED DIRECTIVES: Y MOST FORM: yes  PPS: 30%   PHYSICAL EXAM:   Patient would not allow RN assessment or vitals   (Duration of visit and documentation 60 minutes)   Anna Eastern, RN BSN

## 2021-03-10 DIAGNOSIS — E46 Unspecified protein-calorie malnutrition: Secondary | ICD-10-CM | POA: Diagnosis not present

## 2021-03-10 DIAGNOSIS — I10 Essential (primary) hypertension: Secondary | ICD-10-CM | POA: Diagnosis not present

## 2021-03-10 DIAGNOSIS — F0391 Unspecified dementia with behavioral disturbance: Secondary | ICD-10-CM | POA: Diagnosis not present

## 2021-03-10 DIAGNOSIS — K625 Hemorrhage of anus and rectum: Secondary | ICD-10-CM | POA: Diagnosis not present

## 2021-03-10 DIAGNOSIS — C20 Malignant neoplasm of rectum: Secondary | ICD-10-CM | POA: Diagnosis not present

## 2021-03-10 DIAGNOSIS — I679 Cerebrovascular disease, unspecified: Secondary | ICD-10-CM | POA: Diagnosis not present

## 2021-03-10 DIAGNOSIS — N3289 Other specified disorders of bladder: Secondary | ICD-10-CM | POA: Diagnosis not present

## 2021-03-12 DIAGNOSIS — C20 Malignant neoplasm of rectum: Secondary | ICD-10-CM | POA: Diagnosis not present

## 2021-03-12 DIAGNOSIS — I679 Cerebrovascular disease, unspecified: Secondary | ICD-10-CM | POA: Diagnosis not present

## 2021-03-12 DIAGNOSIS — I10 Essential (primary) hypertension: Secondary | ICD-10-CM | POA: Diagnosis not present

## 2021-03-12 DIAGNOSIS — F0391 Unspecified dementia with behavioral disturbance: Secondary | ICD-10-CM | POA: Diagnosis not present

## 2021-03-12 DIAGNOSIS — K625 Hemorrhage of anus and rectum: Secondary | ICD-10-CM | POA: Diagnosis not present

## 2021-03-12 DIAGNOSIS — E46 Unspecified protein-calorie malnutrition: Secondary | ICD-10-CM | POA: Diagnosis not present

## 2021-03-15 DIAGNOSIS — E46 Unspecified protein-calorie malnutrition: Secondary | ICD-10-CM | POA: Diagnosis not present

## 2021-03-15 DIAGNOSIS — C20 Malignant neoplasm of rectum: Secondary | ICD-10-CM | POA: Diagnosis not present

## 2021-03-15 DIAGNOSIS — K625 Hemorrhage of anus and rectum: Secondary | ICD-10-CM | POA: Diagnosis not present

## 2021-03-15 DIAGNOSIS — F0391 Unspecified dementia with behavioral disturbance: Secondary | ICD-10-CM | POA: Diagnosis not present

## 2021-03-15 DIAGNOSIS — I10 Essential (primary) hypertension: Secondary | ICD-10-CM | POA: Diagnosis not present

## 2021-03-15 DIAGNOSIS — I679 Cerebrovascular disease, unspecified: Secondary | ICD-10-CM | POA: Diagnosis not present

## 2021-03-18 DIAGNOSIS — I679 Cerebrovascular disease, unspecified: Secondary | ICD-10-CM | POA: Diagnosis not present

## 2021-03-18 DIAGNOSIS — C20 Malignant neoplasm of rectum: Secondary | ICD-10-CM | POA: Diagnosis not present

## 2021-03-18 DIAGNOSIS — I10 Essential (primary) hypertension: Secondary | ICD-10-CM | POA: Diagnosis not present

## 2021-03-18 DIAGNOSIS — F0391 Unspecified dementia with behavioral disturbance: Secondary | ICD-10-CM | POA: Diagnosis not present

## 2021-03-18 DIAGNOSIS — E46 Unspecified protein-calorie malnutrition: Secondary | ICD-10-CM | POA: Diagnosis not present

## 2021-03-18 DIAGNOSIS — K625 Hemorrhage of anus and rectum: Secondary | ICD-10-CM | POA: Diagnosis not present

## 2021-03-19 DIAGNOSIS — F0391 Unspecified dementia with behavioral disturbance: Secondary | ICD-10-CM | POA: Diagnosis not present

## 2021-03-19 DIAGNOSIS — I679 Cerebrovascular disease, unspecified: Secondary | ICD-10-CM | POA: Diagnosis not present

## 2021-03-19 DIAGNOSIS — E46 Unspecified protein-calorie malnutrition: Secondary | ICD-10-CM | POA: Diagnosis not present

## 2021-03-19 DIAGNOSIS — K625 Hemorrhage of anus and rectum: Secondary | ICD-10-CM | POA: Diagnosis not present

## 2021-03-19 DIAGNOSIS — C20 Malignant neoplasm of rectum: Secondary | ICD-10-CM | POA: Diagnosis not present

## 2021-03-19 DIAGNOSIS — I10 Essential (primary) hypertension: Secondary | ICD-10-CM | POA: Diagnosis not present

## 2021-03-22 DIAGNOSIS — E46 Unspecified protein-calorie malnutrition: Secondary | ICD-10-CM | POA: Diagnosis not present

## 2021-03-22 DIAGNOSIS — K625 Hemorrhage of anus and rectum: Secondary | ICD-10-CM | POA: Diagnosis not present

## 2021-03-22 DIAGNOSIS — I10 Essential (primary) hypertension: Secondary | ICD-10-CM | POA: Diagnosis not present

## 2021-03-22 DIAGNOSIS — C20 Malignant neoplasm of rectum: Secondary | ICD-10-CM | POA: Diagnosis not present

## 2021-03-22 DIAGNOSIS — F0391 Unspecified dementia with behavioral disturbance: Secondary | ICD-10-CM | POA: Diagnosis not present

## 2021-03-22 DIAGNOSIS — I679 Cerebrovascular disease, unspecified: Secondary | ICD-10-CM | POA: Diagnosis not present

## 2021-03-26 DIAGNOSIS — C20 Malignant neoplasm of rectum: Secondary | ICD-10-CM | POA: Diagnosis not present

## 2021-03-26 DIAGNOSIS — I10 Essential (primary) hypertension: Secondary | ICD-10-CM | POA: Diagnosis not present

## 2021-03-26 DIAGNOSIS — E46 Unspecified protein-calorie malnutrition: Secondary | ICD-10-CM | POA: Diagnosis not present

## 2021-03-26 DIAGNOSIS — F0391 Unspecified dementia with behavioral disturbance: Secondary | ICD-10-CM | POA: Diagnosis not present

## 2021-03-26 DIAGNOSIS — I679 Cerebrovascular disease, unspecified: Secondary | ICD-10-CM | POA: Diagnosis not present

## 2021-03-26 DIAGNOSIS — K625 Hemorrhage of anus and rectum: Secondary | ICD-10-CM | POA: Diagnosis not present

## 2021-04-01 DIAGNOSIS — I679 Cerebrovascular disease, unspecified: Secondary | ICD-10-CM | POA: Diagnosis not present

## 2021-04-01 DIAGNOSIS — F0391 Unspecified dementia with behavioral disturbance: Secondary | ICD-10-CM | POA: Diagnosis not present

## 2021-04-01 DIAGNOSIS — K625 Hemorrhage of anus and rectum: Secondary | ICD-10-CM | POA: Diagnosis not present

## 2021-04-01 DIAGNOSIS — I10 Essential (primary) hypertension: Secondary | ICD-10-CM | POA: Diagnosis not present

## 2021-04-01 DIAGNOSIS — C20 Malignant neoplasm of rectum: Secondary | ICD-10-CM | POA: Diagnosis not present

## 2021-04-01 DIAGNOSIS — E46 Unspecified protein-calorie malnutrition: Secondary | ICD-10-CM | POA: Diagnosis not present

## 2021-04-04 DEATH — deceased

## 2021-05-01 IMAGING — CT CT CERVICAL SPINE W/O CM
3 of 4 series · 9 of 33 positions shown, 10 images · non-contrast
Comparison: None.

CLINICAL DATA: Recent fall with facial injury, initial encounter

EXAM:
CT CERVICAL SPINE WITHOUT CONTRAST
TECHNIQUE: Multidetector CT imaging of the cervical spine was performed without
intravenous contrast. Multiplanar CT image reconstructions were also
generated.

[Series 5: orthogonal bone · axial · 0.17mm/px · z∈[-136,-136]mm · 1 of 91 slices shown, 2 images]
[im 46/91  soft-tissue]
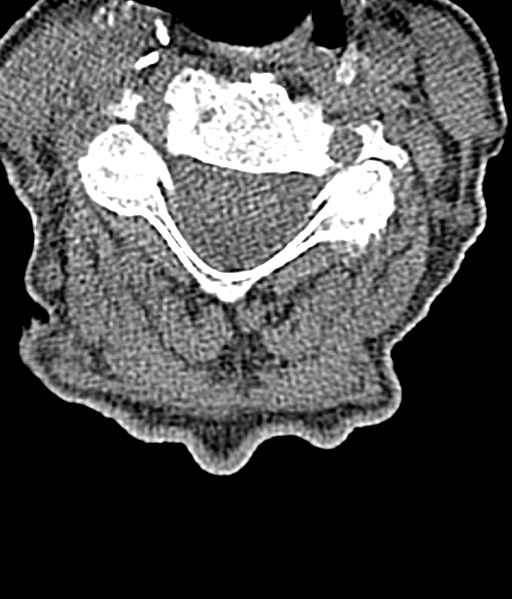
[im 46/91  bone]
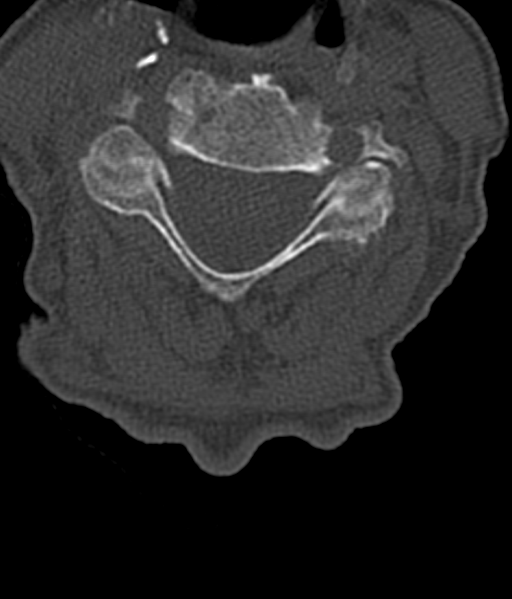

[Series 6: coronal bone · coronal · 0.16mm/px · 3 of 44 slices shown]
[im 9/44  bone]
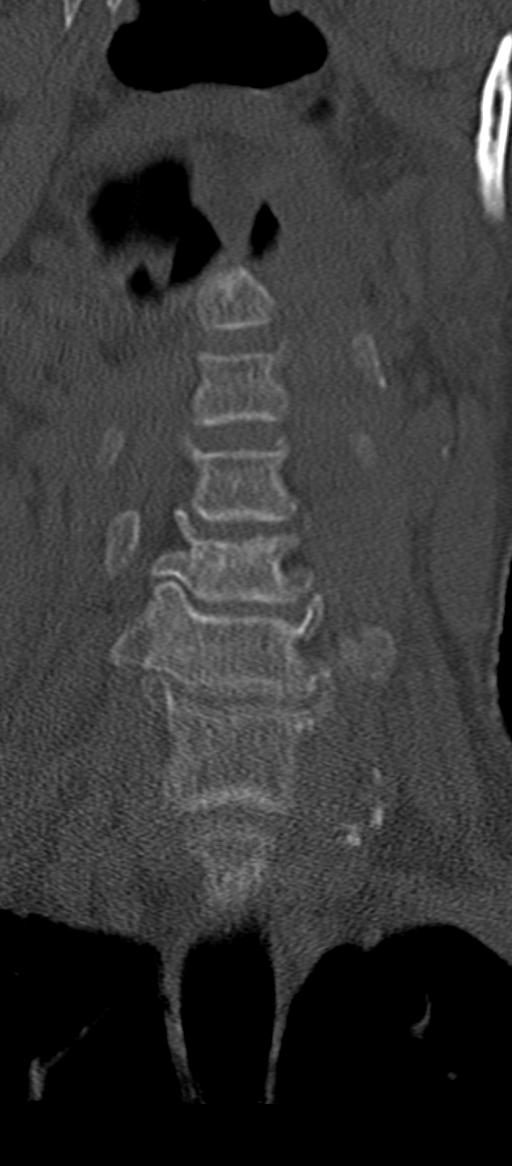
[im 18/44  bone]
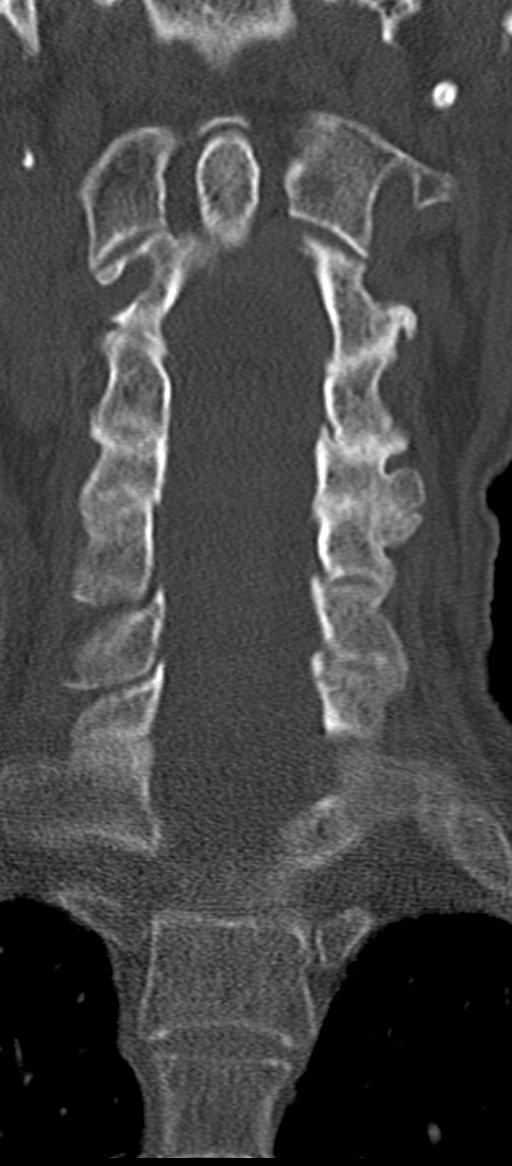
[im 26/44  bone]
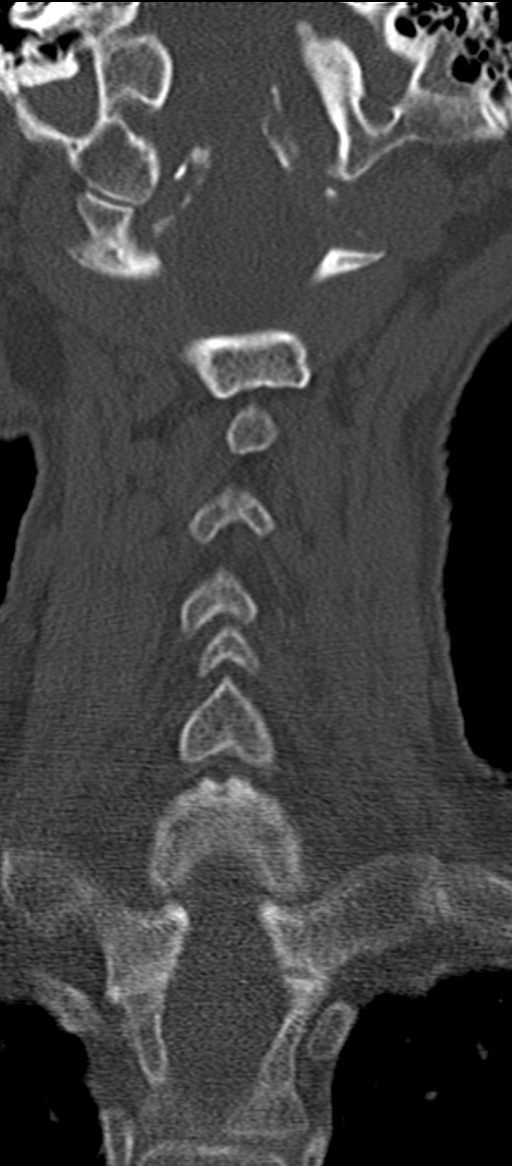

[Series 7: sagittal bone · sagittal · 0.18mm/px · 5 of 41 slices shown]
[im 14/41  bone]
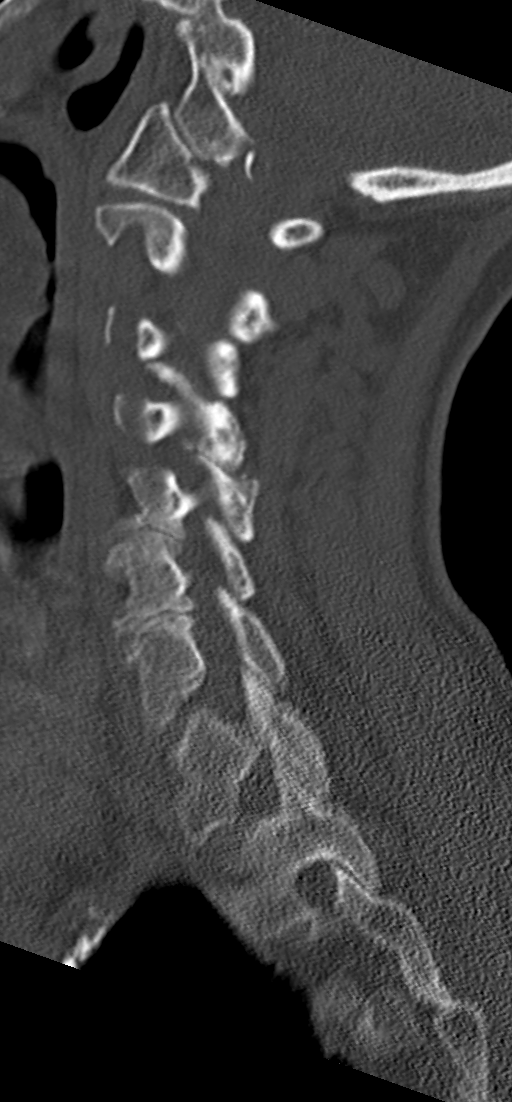
[im 17/41  bone]
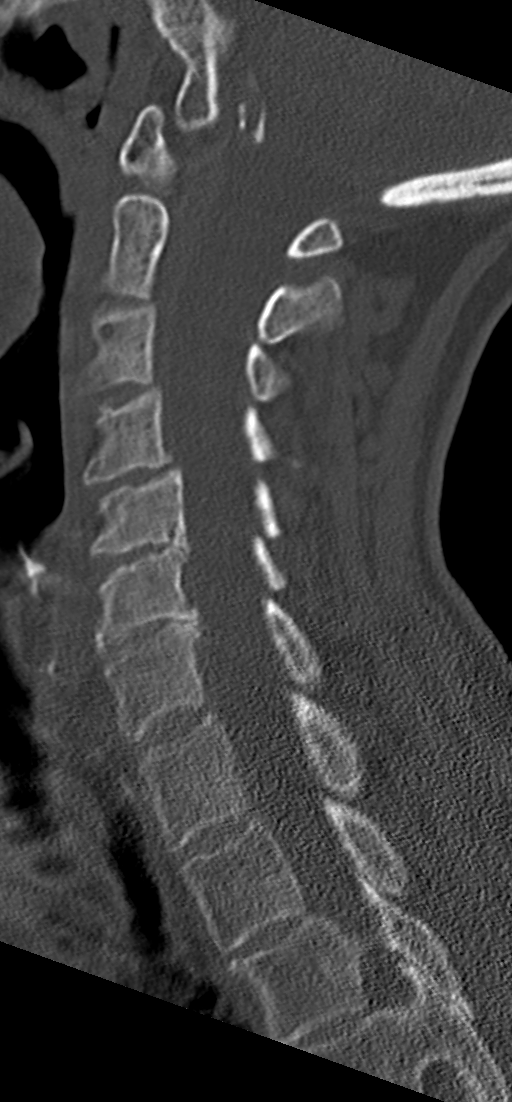
[im 21/41  bone]
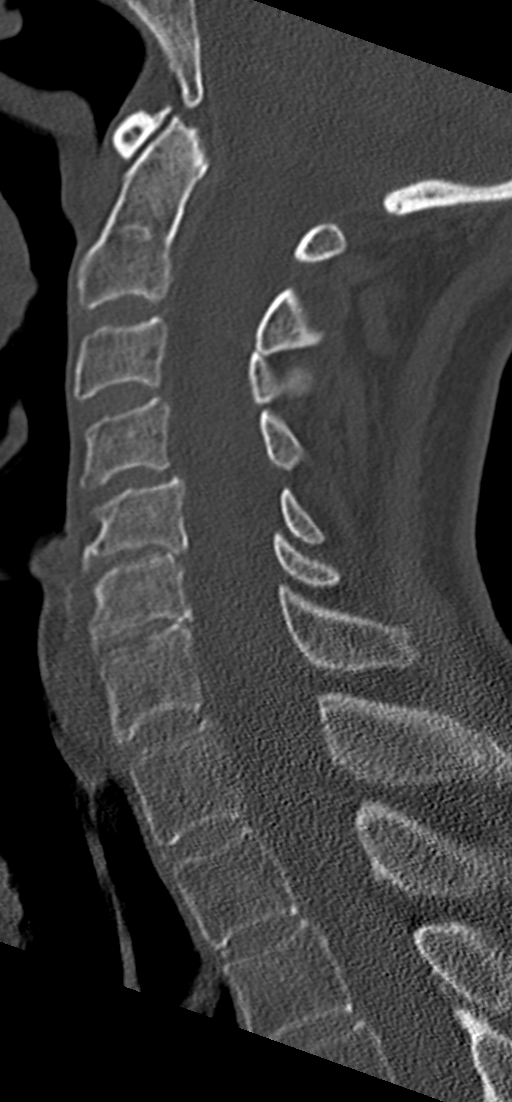
[im 24/41  bone]
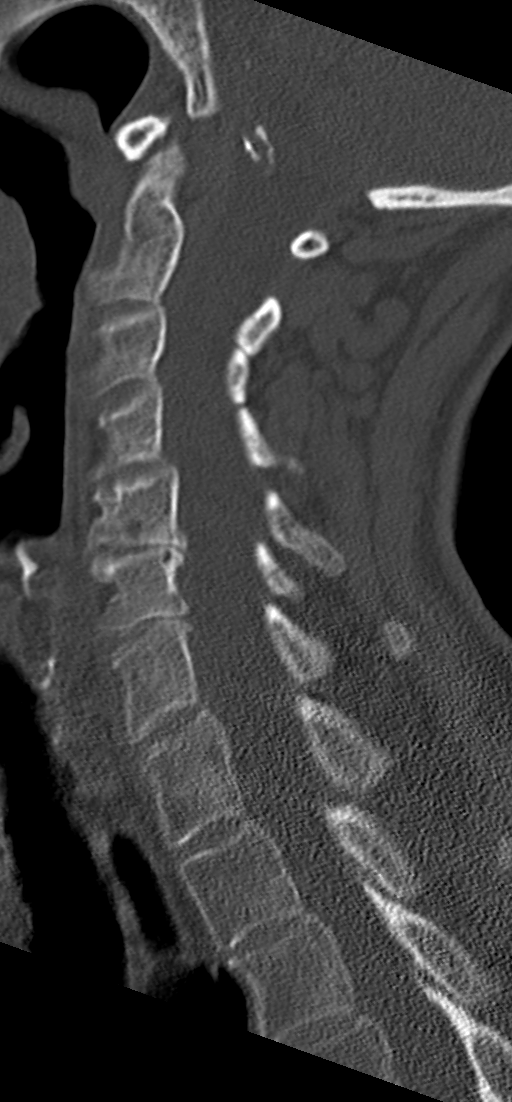
[im 27/41  bone]
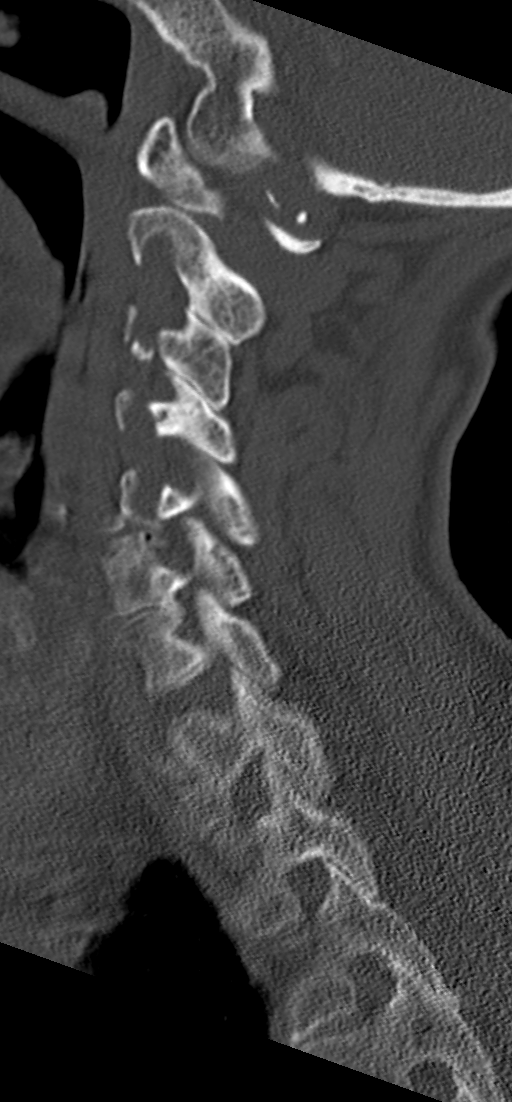

[9 of 33 positions shown; findings below may reference images not displayed]

FINDINGS: Alignment: Within normal limits.

Skull base and vertebrae: The images are somewhat limited by patient
motion artifact. Seven cervical segments are well visualized.
Vertebral body height is well maintained. Multilevel facet
hypertrophic changes are seen predominately on the left. No acute
fracture or acute facet abnormality is noted. Mild disc space
narrowing is noted from

Soft tissues and spinal canal: Surrounding soft tissue structures
demonstrate atherosclerotic calcifications of the carotid arteries.
No other specific soft tissue abnormality is noted.

Upper chest: Visualized lung apices are within normal limits.

Other: None
IMPRESSION: Multilevel degenerative change without acute abnormality.

## 2022-01-14 IMAGING — CR DG HIP (WITH OR WITHOUT PELVIS) 2-3V*R*
1 series · 1 of 1 positions shown · non-contrast
Comparison: None.

CLINICAL DATA: Right hip pain for 5 days.

EXAM:
DG HIP (WITH OR WITHOUT PELVIS) 2-3V RIGHT

[t hip ap right]
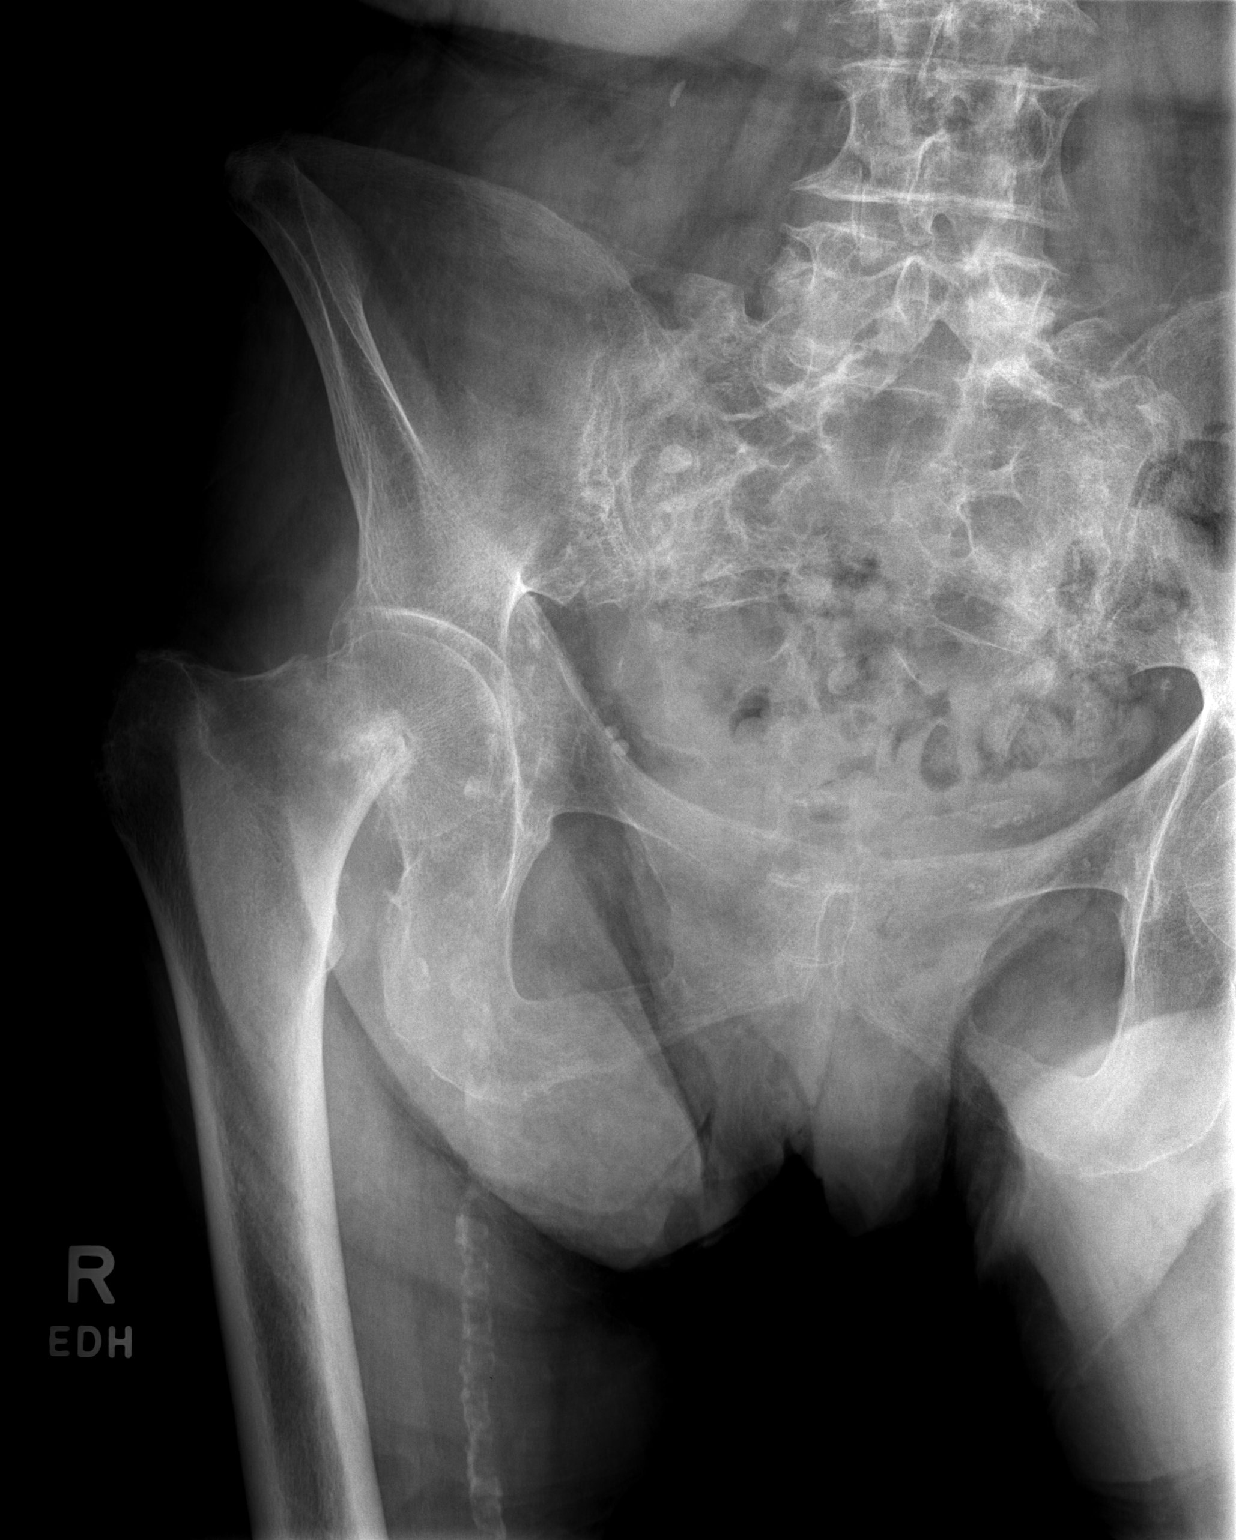

[1 of 1 positions shown; findings below may reference images not displayed]

FINDINGS: There is a right femoral neck fracture with impaction. No
subluxation or dislocation. Vascular calcifications noted.
IMPRESSION: Right femoral neck fracture with impaction.

These results will be called to the ordering clinician or
representative by the [HOSPITAL] at the imaging location.

## 2022-01-15 IMAGING — DX DG PORTABLE PELVIS
1 series · 2 of 2 positions shown · non-contrast
Comparison: July 08, 2019

CLINICAL DATA: Status post RIGHT hip arthroplasty.

EXAM:
PORTABLE PELVIS 1-2 VIEWS

[Series 1: pelvis ap · 0.14mm/px · 2 of 2 slices shown]
[im 1/2]
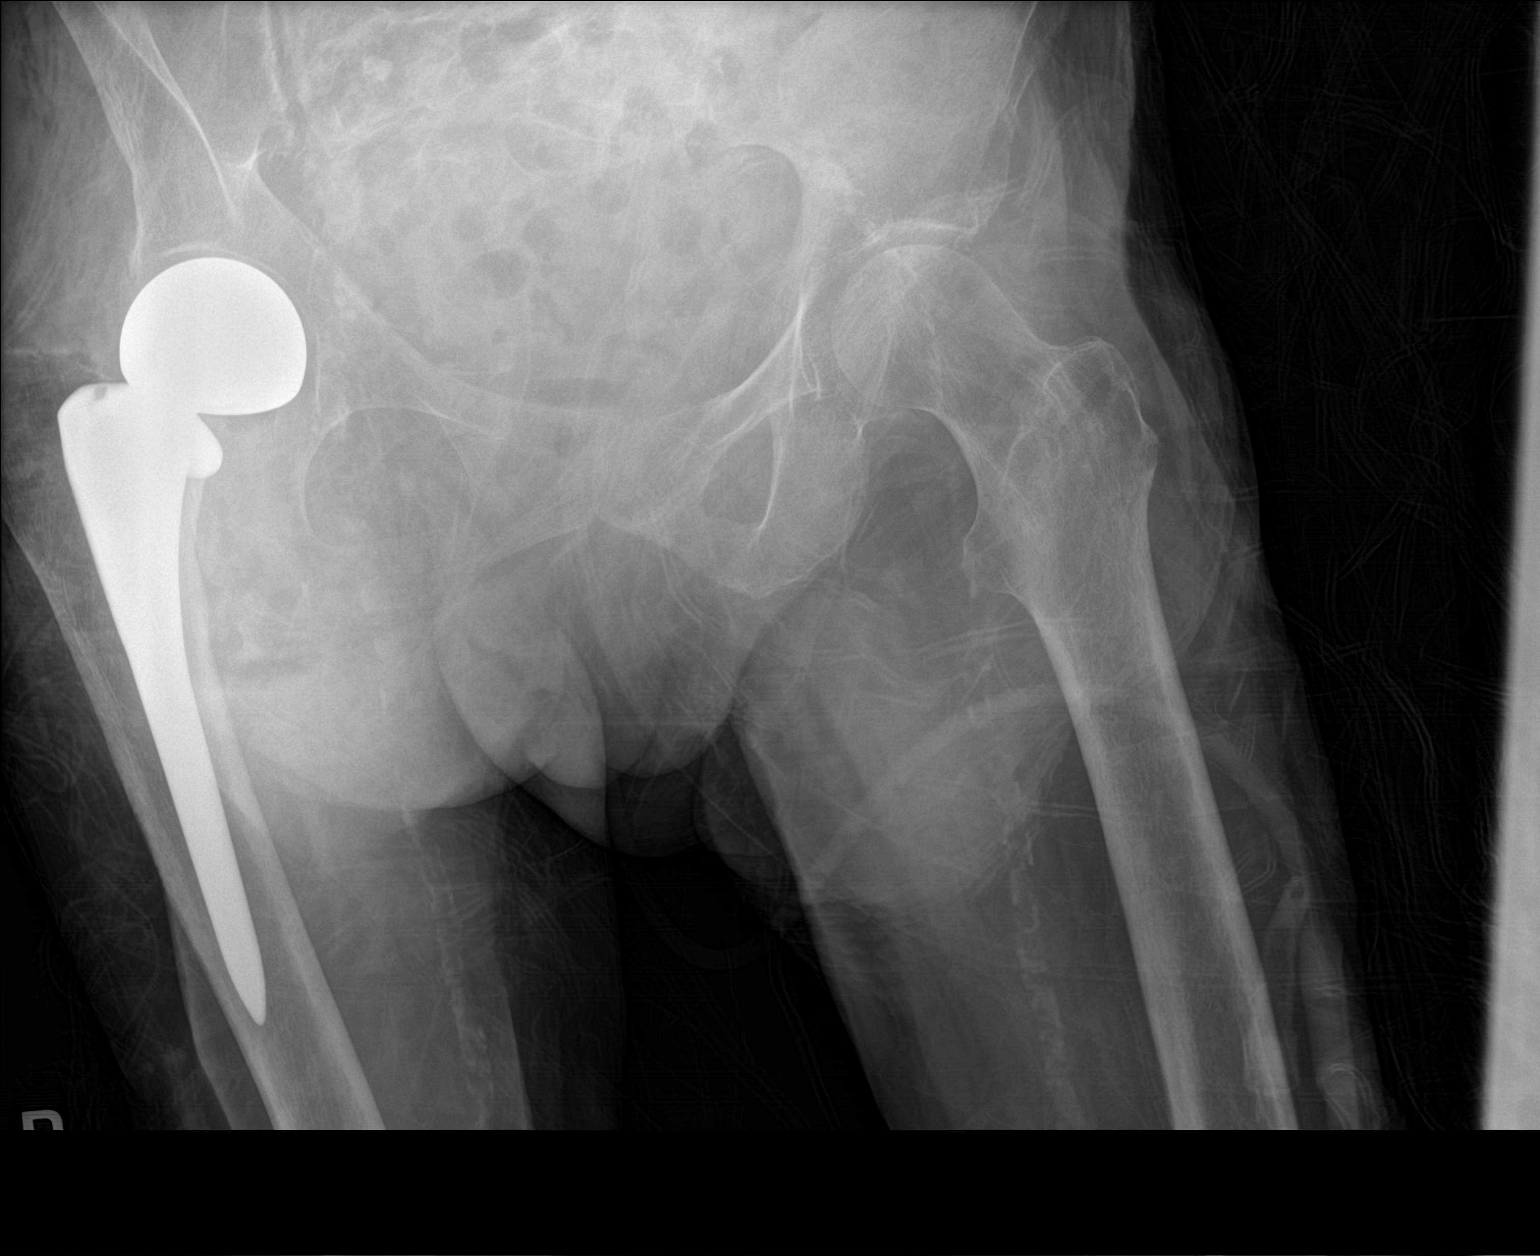
[im 2/2]
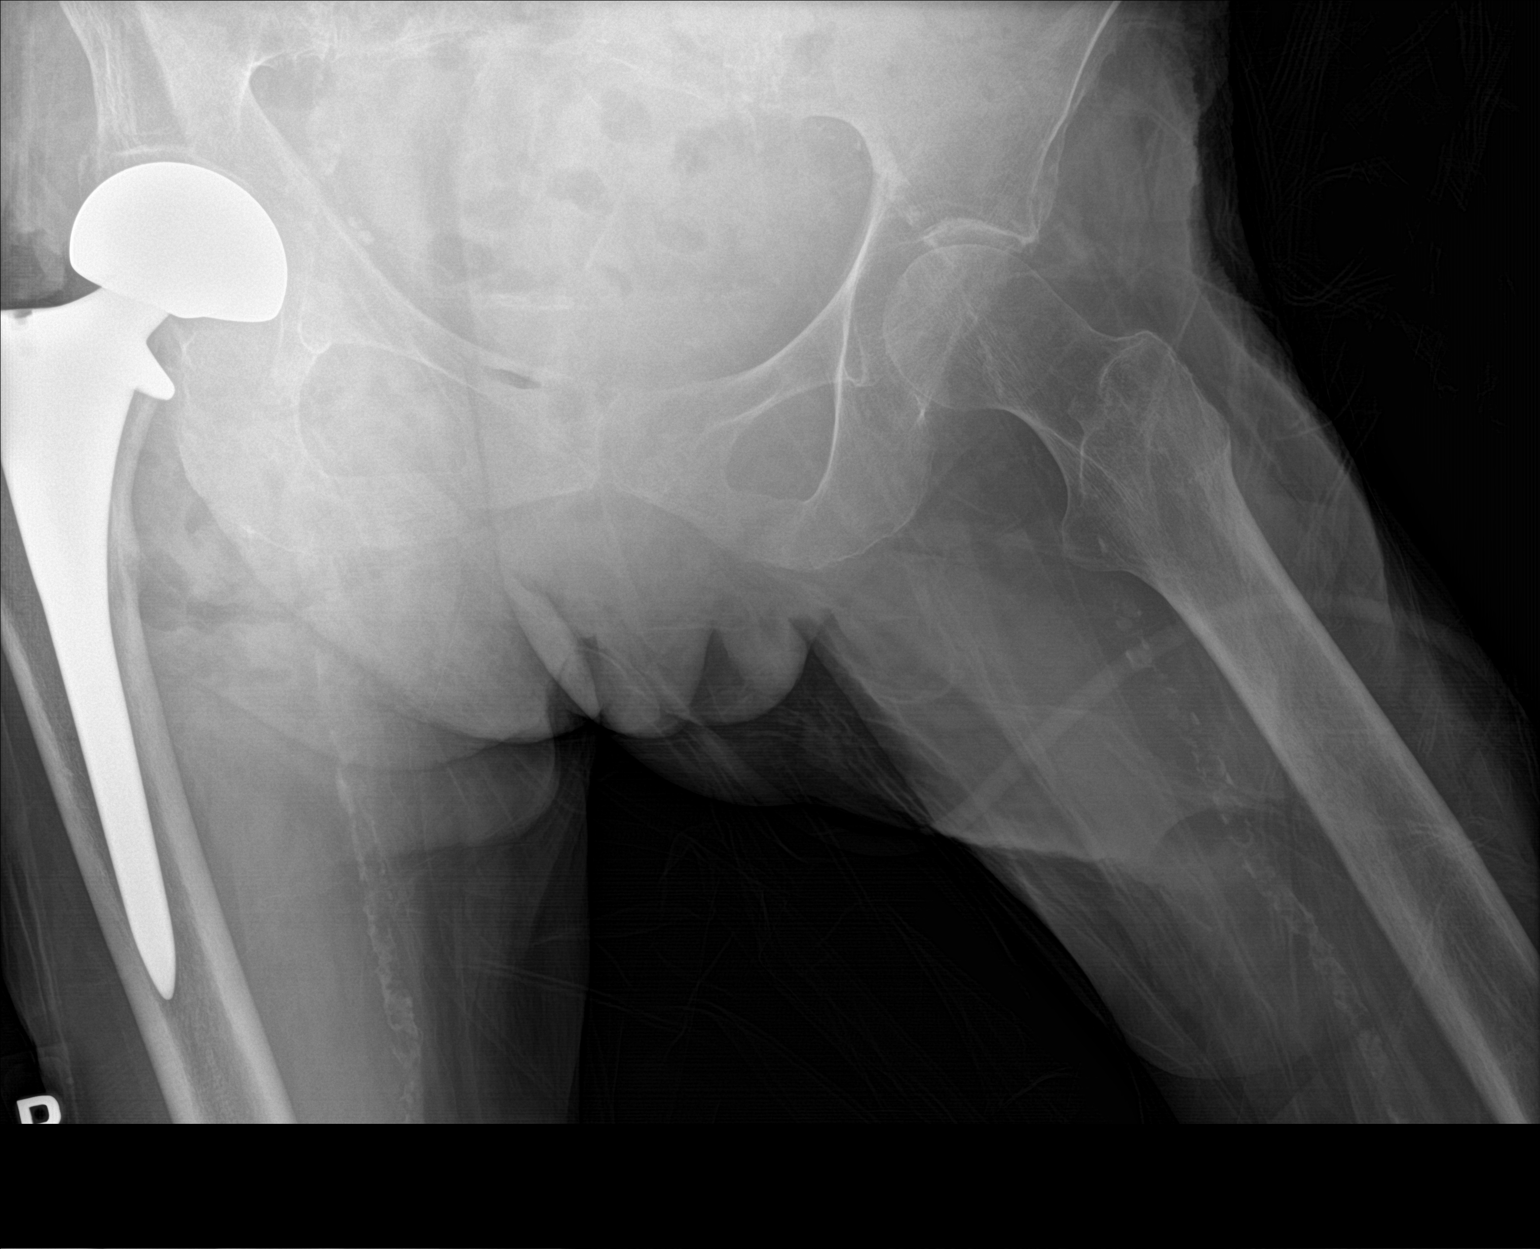

[2 of 2 positions shown; findings below may reference images not displayed]

FINDINGS: Post RIGHT hip arthroplasty, osteopenia. The greater trochanter is
incompletely imaged on submitted views. Visualized portions of the
RIGHT proximal femur show no acute process. On AP view the femoral
component appears well seated. Acetabular component within the
acetabulum.

Limited view of the pelvis excludes the upper margin of the iliac
crest. No signs of pelvic fracture on submitted views which are
limited by obliquity and field of view as discussed.
IMPRESSION: 1. Post RIGHT hip arthroplasty without complicating features.
2. No signs of pelvic fracture on submitted views which are limited
by field of view and obliquity. Given incomplete visualization of
the greater trochanter a follow-up evaluation is suggested when the
patient is able to include the RIGHT hip and greater trochanter.
Gas in the soft tissues adjacent to the hip compatible with
postoperative change.
